# Patient Record
Sex: Male | Born: 1935 | Hispanic: Yes | Smoking: Never smoker
Health system: Southern US, Community
[De-identification: ages and names within clinical notes are randomized; demographics above are authoritative.]

## PROBLEM LIST (undated history)

## (undated) DIAGNOSIS — G479 Sleep disorder, unspecified: Secondary | ICD-10-CM

## (undated) DIAGNOSIS — R3911 Hesitancy of micturition: Secondary | ICD-10-CM

## (undated) DIAGNOSIS — I10 Essential (primary) hypertension: Secondary | ICD-10-CM

## (undated) DIAGNOSIS — R3915 Urgency of urination: Principal | ICD-10-CM

## (undated) DIAGNOSIS — Z0000 Encounter for general adult medical examination without abnormal findings: Secondary | ICD-10-CM

## (undated) DIAGNOSIS — R972 Elevated prostate specific antigen [PSA]: Secondary | ICD-10-CM

## (undated) DIAGNOSIS — E785 Hyperlipidemia, unspecified: Secondary | ICD-10-CM

## (undated) DIAGNOSIS — D696 Thrombocytopenia, unspecified: Secondary | ICD-10-CM

## (undated) DIAGNOSIS — R21 Rash and other nonspecific skin eruption: Secondary | ICD-10-CM

## (undated) DIAGNOSIS — L304 Erythema intertrigo: Secondary | ICD-10-CM

## (undated) DIAGNOSIS — F411 Generalized anxiety disorder: Secondary | ICD-10-CM

## (undated) DIAGNOSIS — L309 Dermatitis, unspecified: Secondary | ICD-10-CM

## (undated) DIAGNOSIS — K5900 Constipation, unspecified: Secondary | ICD-10-CM

## (undated) HISTORY — DX: Elevated prostate specific antigen (PSA): R97.20

## (undated) HISTORY — DX: Dermatitis, unspecified: L30.9

## (undated) HISTORY — DX: Erythema intertrigo: L30.4

## (undated) HISTORY — DX: Essential (primary) hypertension: I10

## (undated) HISTORY — DX: Sleep disorder, unspecified: G47.9

## (undated) HISTORY — DX: Hyperlipidemia, unspecified: E78.5

## (undated) HISTORY — DX: Urgency of urination: R39.15

## (undated) HISTORY — DX: Encounter for general adult medical examination without abnormal findings: Z00.00

## (undated) HISTORY — DX: Thrombocytopenia, unspecified: D69.6

## (undated) HISTORY — DX: Generalized anxiety disorder: F41.1

## (undated) HISTORY — DX: Rash and other nonspecific skin eruption: R21

## (undated) HISTORY — DX: Hesitancy of micturition: R39.11

## (undated) HISTORY — DX: Constipation, unspecified: K59.00

---

## 2011-09-10 ENCOUNTER — Encounter: Payer: Self-pay | Admitting: Family Medicine

## 2011-09-10 ENCOUNTER — Ambulatory Visit (INDEPENDENT_AMBULATORY_CARE_PROVIDER_SITE_OTHER): Payer: Medicare Other | Admitting: Family Medicine

## 2011-09-10 DIAGNOSIS — Z0000 Encounter for general adult medical examination without abnormal findings: Secondary | ICD-10-CM

## 2011-09-10 DIAGNOSIS — G4700 Insomnia, unspecified: Secondary | ICD-10-CM

## 2011-09-10 DIAGNOSIS — I10 Essential (primary) hypertension: Secondary | ICD-10-CM

## 2011-09-10 DIAGNOSIS — Z23 Encounter for immunization: Secondary | ICD-10-CM | POA: Diagnosis not present

## 2011-09-10 MED ORDER — NEBIVOLOL HCL 5 MG PO TABS: 5.0000 mg | ORAL_TABLET | Freq: Every day | ORAL | Status: DC

## 2011-09-10 NOTE — Patient Instructions (Addendum)
Preventative Care for Adults, Male A healthy lifestyle and preventative care can promote health and wellness. Preventative health guidelines for men include the following key practices:  A routine yearly physical is a good way to check with your caregiver about your health and preventative screening. It is a chance to share any concerns and updates on your health, and to receive a thorough exam.   Visit your dentist for a routine exam and preventative care every 6 months. Brush your teeth twice a day and floss once a day. Good oral hygiene prevents tooth decay and gum disease.   The frequency of eye exams is based on your age, health, family medical history, use of contact lenses, and other factors. Follow your caregiver's recommendations for frequency of eye exams.   Eat a healthy diet. Foods like vegetables, fruits, whole grains, low-fat dairy products, and lean protein foods contain the nutrients you need without too many calories. Decrease your intake of foods high in solid fats, added sugars, and salt. Eat the right amount of calories for you.Get information about a proper diet from your caregiver, if necessary.   Regular physical exercise is one of the most important things you can do for your health. Most adults should get at least 150 minutes of moderate-intensity exercise (any activity that increases your heart rate and causes you to sweat) each week. In addition, most adults need muscle-strengthening exercises on 2 or more days a week.   Maintain a healthy weight. The body mass index (BMI) is a screening tool to identify possible weight problems. It provides an estimate of body fat based on height and weight. Your caregiver can help determine your BMI, and can help you achieve or maintain a healthy weight.For adults 20 years and older:   A BMI below 18.5 is considered underweight.   A BMI of 18.5 to 24.9 is normal.   A BMI of 25 to 29.9 is considered overweight.   A BMI of 30 and  above is considered obese.   Maintain normal blood lipids and cholesterol levels by exercising and minimizing your intake of saturated fat. Eat a balanced diet with plenty of fruit and vegetables. Blood tests for lipids and cholesterol should begin at age 20 and be repeated every 5 years. If your lipid or cholesterol levels are high, you are over 50, or you are a high risk for heart disease, you may need your cholesterol levels checked more frequently.Ongoing high lipid and cholesterol levels should be treated with medicines if diet and exercise are not effective.   If you smoke, find out from your caregiver how to quit. If you do not use tobacco, do not start.   If you choose to drink alcohol, do not exceed 2 drinks per day. One drink is considered to be 12 ounces (355 mL) of beer, 5 ounces (148 mL) of wine, or 1.5 ounces (44 mL) of liquor.   Avoid use of street drugs. Do not share needles with anyone. Ask for help if you need support or instructions about stopping the use of drugs.   High blood pressure causes heart disease and increases the risk of stroke. Your blood pressure should be checked at least every 1 to 2 years. Ongoing high blood pressure should be treated with medicines, if weight loss and exercise are not effective.   If you are 45 to 76 years old, ask your caregiver if you should take aspirin to prevent heart disease.   Diabetes screening involves taking a blood   sample to check your fasting blood sugar level. This should be done once every 3 years, after age 45, if you are within normal weight and without risk factors for diabetes. Testing should be considered at a younger age or be carried out more frequently if you are overweight and have at least 1 risk factor for diabetes.   Colorectal cancer can be detected and often prevented. Most routine colorectal cancer screening begins at the age of 50 and continues through age 75. However, your caregiver may recommend screening at an  earlier age if you have risk factors for colon cancer. On a yearly basis, your caregiver may provide home test kits to check for hidden blood in the stool. Use of a small camera at the end of a tube, to directly examine the colon (sigmoidoscopy or colonoscopy), can detect the earliest forms of colorectal cancer. Talk to your caregiver about this at age 50, when routine screening begins. Direct examination of the colon should be repeated every 5 to 10 years through age 75, unless early forms of pre-cancerous polyps or small growths are found.   Practice safe sex. Use condoms and avoid high-risk sexual practices to reduce the spread of sexually transmitted infections (STIs). STIs include gonorrhea, chlamydia, syphilis, trichomonas, herpes, HPV, and human immunodeficiency virus (HIV). Herpes, HIV, and HPV are viral illnesses that have no cure. They can result in disability, cancer, and death.   A one-time screening for abdominal aortic aneurysm (AAA) and surgical repair of large AAAs by sound wave imaging (ultrasonography) is recommended for ages 65 to 75 years who are current or former smokers.   Healthy men should no longer receive prostate-specific antigen (PSA) blood tests as part of routine cancer screening. Consult with your caregiver about prostate cancer screening.   Use sunscreen with skin protection factor (SPF) of 30 or more. Apply sunscreen liberally and repeatedly throughout the day. You should seek shade when your shadow is shorter than you. Protect yourself by wearing long sleeves, pants, a wide-brimmed hat, and sunglasses year round, whenever you are outdoors.   Once a month, do a whole body skin exam, using a mirror to look at the skin on your back. Notify your caregiver of new moles, moles that have irregular borders, moles that are larger than a pencil eraser, or moles that have changed in shape or color.   Stay current with required immunizations.   Influenza. You need a dose every  fall (or winter). The composition of the flu vaccine changes each year, so being vaccinated once is not enough.   Pneumococcal polysaccharide. You need 1 to 2 doses if you smoke cigarettes or if you have certain chronic medical conditions. You need 1 dose at age 65 (or older) if you have never been vaccinated.   Tetanus, diphtheria, pertussis (Tdap, Td). Get 1 dose of Tdap vaccine if you are younger than age 65 years, are over 65 and have contact with an infant, are a healthcare worker, or simply want to be protected from whooping cough. After that, you need a Td booster dose every 10 years. Consult your caregiver if you have not had at least 3 tetanus and diphtheria-containing shots sometime in your life or have a deep or dirty wound.   HPV. This vaccine is recommended for males 13 through 76 years of age. This vaccine may be given to men 22 through 76 years of age who have not completed the 3 dose series. It is recommended for men through age 26   who have sex with men or whose immune system is weakened because of HIV infection, other illness, or medications. The vaccine is given in 3 doses over 6 months.   Measles, mumps, rubella (MMR). You need at least 1 dose of MMR if you were born in 1957 or later. You may also need a 2nd dose.   Meningococcal. If you are age 19 to 21 years and a first-year college student living in a residence hall, or have one of several medical conditions, you need to get vaccinated against meningococcal disease. You may also need additional booster doses.   Zoster (shingles). If you are age 60 years or older, you should get this vaccine.   Varicella (chickenpox). If you have never had chickenpox or you were vaccinated but received only 1 dose, talk to your caregiver to find out if you need this vaccine.   Hepatitis A. You need this vaccine if you have a specific risk factor for hepatitis A virus infection, or you simply wish to be protected from this disease. The vaccine is  usually given as 2 doses, 6 to 18 months apart.   Hepatitis B. You need this vaccine if you have a specific risk factor for hepatitis B virus infection or you simply wish to be protected from this disease. The vaccine is given in 3 doses, usually over 6 months.  Preventative Service / Frequency Ages 19 to 39  Blood pressure check.** / Every 1 to 2 years.   Lipid and cholesterol check.**/ Every 5 years beginning at age 20.   Skin self-exam. / Monthly.   Influenza immunization.** / Every year.   Pneumococcal polysaccharide immunization.** / 1 to 2 doses if you smoke cigarettes or if you have certain chronic medical conditions.   Tetanus, diphtheria, pertussis (Tdap,Td) immunization. / A one-time dose of Tdap vaccine. After that, you need a Td booster dose every 10 years.   HPV immunization. / 3 doses over 6 months, if 26 and younger.   Measles, mumps, rubella (MMR) immunization. / You need at least 1 dose of MMR if you were born in 1957 or later. You may also need a 2nd dose.   Meningococcal immunization. / 1 dose if you are age 19 to 21 years and a first-year college student living in a residence hall, or have one of several medical conditions, you need to get vaccinated against meningococcal disease. You may also need additional booster doses.   Varicella immunization. **/ Consult your caregiver.   Hepatitis A immunization. ** / Consult your caregiver. 2 doses, 6 to 18 months apart.   Hepatitis B immunization.** / Consult your caregiver. 3 doses usually over 6 months.  Ages 40 to 64  Blood pressure check.** / Every 1 to 2 years.   Lipid and cholesterol check.**/ Every 5 years beginning at age 20.   Fecal occult blood test (FOBT) of stool. / Every year beginning at age 50 and continuing until age 75. You may not have to do this test if you get colonoscopy every 10 years.   Flexible sigmoidoscopy** or colonoscopy.** / Every 5 years for a flexible sigmoidoscopy or every 10 years for  a colonoscopy beginning at age 50 and continuing until age 75.   Skin self-exam. / Monthly.   Influenza immunization.** / Every year.   Pneumococcal polysaccharide immunization.** / 1 to 2 doses if you smoke cigarettes or if you have certain chronic medical conditions.   Tetanus, diphtheria, pertussis (Tdap/Td) immunization.** / A one-time dose of   Tdap vaccine. After that, you need a Td booster dose every 10 years.   Measles, mumps, rubella (MMR) immunization. / You need at least 1 dose of MMR if you were born in 1957 or later. You may also need a 2nd dose.   Varicella immunization. **/ Consult your caregiver.   Meningococcal immunization.** / Consult your caregiver.   Hepatitis A immunization. ** / Consult your caregiver. 2 doses, 6 to 18 months apart.   Hepatitis B immunization.** / Consult your caregiver. 3 doses, usually over 6 months.  Ages 84 and over  Blood pressure check.** / Every 1 to 2 years.   Lipid and cholesterol check.**/ Every 5 years beginning at age 51.   Fecal occult blood test (FOBT) of stool. / Every year beginning at age 34 and continuing until age 63. You may not have to do this test if you get colonoscopy every 10 years.   Flexible sigmoidoscopy** or colonoscopy.** / Every 5 years for a flexible sigmoidoscopy or every 10 years for a colonoscopy beginning at age 97 and continuing until age 53.   Abdominal aortic aneurysm (AAA) screening.** / A one-time screening for ages 26 to 40 years who are current or former smokers.   Skin self-exam. / Monthly.   Influenza immunization.** / Every year.   Pneumococcal polysaccharide immunization.** / 1 dose at age 83 (or older) if you have never been vaccinated.   Tetanus, diphtheria, pertussis (Tdap, Td) immunization. / A one-time dose of Tdap vaccine if you are over 65 and have contact with an infant, are a Research scientist (physical sciences), or simply want to be protected from whooping cough. After that, you need a Td booster dose  every 10 years.   Varicella immunization. **/ Consult your caregiver.   Meningococcal immunization.** / Consult your caregiver.   Hepatitis A immunization. ** / Consult your caregiver. 2 doses, 6 to 18 months apart.   Hepatitis B immunization.** / Check with your caregiver. 3 doses, usually over 6 months.  **Family history and personal history of risk and conditions may change your caregiver's recommendations. Document Released: 09/28/2001 Document Revised: 04/14/2011 Document Reviewed: 12/28/2010 [facility] Patient Information 2012 [place], [place].  Hypertension As your heart beats, it forces blood through your arteries. This force is your blood pressure. If the pressure is too high, it is called hypertension (HTN) or high blood pressure. HTN is dangerous because you may have it and not know it. High blood pressure may mean that your heart has to work harder to pump blood. Your arteries may be narrow or stiff. The extra work puts you at risk for heart disease, stroke, and other problems.  Blood pressure consists of two numbers, a higher number over a lower, 110/72, for example. It is stated as "110 over 72." The ideal is below 120 for the top number (systolic) and under 80 for the bottom (diastolic). Write down your blood pressure today. You should pay close attention to your blood pressure if you have certain conditions such as:  Heart failure.   Prior heart attack.   Diabetes   Chronic kidney disease.   Prior stroke.   Multiple risk factors for heart disease.  To see if you have HTN, your blood pressure should be measured while you are seated with your arm held at the level of the heart. It should be measured at least twice. A one-time elevated blood pressure reading (especially in the Emergency Department) does not mean that you need treatment. There may be conditions in which  the blood pressure is different between your right and left arms. It is important to see your caregiver soon  for a recheck. Most people have essential hypertension which means that there is not a specific cause. This type of high blood pressure may be lowered by changing lifestyle factors such as:  Stress.   Smoking.   Lack of exercise.   Excessive weight.   Drug/tobacco/alcohol use.   Eating less salt.  Most people do not have symptoms from high blood pressure until it has caused damage to the body. Effective treatment can often prevent, delay or reduce that damage. TREATMENT  When a cause has been identified, treatment for high blood pressure is directed at the cause. There are a large number of medications to treat HTN. These fall into several categories, and your caregiver will help you select the medicines that are best for you. Medications may have side effects. You should review side effects with your caregiver. If your blood pressure stays high after you have made lifestyle changes or started on medicines,   Your medication(s) may need to be changed.   Other problems may need to be addressed.   Be certain you understand your prescriptions, and know how and when to take your medicine.   Be sure to follow up with your caregiver within the time frame advised (usually within two weeks) to have your blood pressure rechecked and to review your medications.   If you are taking more than one medicine to lower your blood pressure, make sure you know how and at what times they should be taken. Taking two medicines at the same time can result in blood pressure that is too low.  SEEK IMMEDIATE MEDICAL CARE IF:  You develop a severe headache, blurred or changing vision, or confusion.   You have unusual weakness or numbness, or a faint feeling.   You have severe chest or abdominal pain, vomiting, or breathing problems.  MAKE SURE YOU:   Understand these instructions.   Will watch your condition.   Will get help right away if you are not doing well or get worse.  Document Released:  08/02/2005 Document Revised: 04/14/2011 Document Reviewed: 03/22/2008 [facility] Patient Information 2012 [place], [place].

## 2011-09-12 ENCOUNTER — Encounter: Payer: Self-pay | Admitting: Family Medicine

## 2011-09-12 DIAGNOSIS — G4700 Insomnia, unspecified: Secondary | ICD-10-CM | POA: Insufficient documentation

## 2011-09-12 DIAGNOSIS — I10 Essential (primary) hypertension: Secondary | ICD-10-CM

## 2011-09-12 DIAGNOSIS — Z0000 Encounter for general adult medical examination without abnormal findings: Secondary | ICD-10-CM | POA: Insufficient documentation

## 2011-09-12 HISTORY — DX: Encounter for general adult medical examination without abnormal findings: Z00.00

## 2011-09-12 HISTORY — DX: Essential (primary) hypertension: I10

## 2011-09-12 NOTE — Assessment & Plan Note (Signed)
Patient reports a long history of white coat hypertension. He denies ever having previously been treated. He also denies checking anywhere else. No concerning symptoms but due to the significant elevation we'll start the spelled 5 mg daily patient is given samples reassess in 3 days' time seek immediate care symptoms develop. Given him or contact diet and asked to avoid sodium and stimulants.

## 2011-09-12 NOTE — Assessment & Plan Note (Signed)
Has never had colonoscopy and does not routinely do immunizations, he is asked to consider these at future visit, patient agrees to return for fasting lab work

## 2011-09-12 NOTE — Progress Notes (Signed)
Patient ID: Steven Morrison, male   DOB: Dec 25, 1927, 78 y.o.   MRN: [id number] Steven Morrison [id number] 1927/11/15 09/12/2011      Progress Note New Patient  Subjective  Chief Complaint  Chief Complaint  Patient presents with  . Establish Care    new patient    HPI   patient is a 78 year old [place] American man who was originally born [place], immigrated initially to [place] and then eventually settled in [place]. He has recently relocated to [place] to be nearer to his only grandchild. He reports being in good health and he offers no acute complaints. He notes on questioning that he suffers with Casimiro Needle tender nose has high blood pressure the doctors but denies ever being treated. He maintains a nearly vegan diet. His biggest complaint is difficulty sleeping. He sleeps anywhere from 4-6 hours a night has trouble focusing and staying asleep and has tried over-the-counter medications including Unisom without relief. Palate, headache, shortness of breath, GI or GU complaints.  Past Medical History  Diagnosis Date  . HTN (hypertension) 09/12/2011    History reviewed. No pertinent past surgical history.  Family History  Problem Relation Age of Onset  . Migraines Daughter   . Allergies Daughter   . Allergies Son     History   Social History  . Marital Status: Divorced    Spouse Name: N/A    Number of Children: N/A  . Years of Education: N/A   Occupational History  . Not on file.   Social History Main Topics  . Smoking status: Never Smoker   . Smokeless tobacco: Never Used  . Alcohol Use: Yes     occasionally  . Drug Use: No  . Sexually Active: No   Other Topics Concern  . Not on file   Social History Narrative  . No narrative on file    No current outpatient prescriptions on file prior to visit.    No Known Allergies  Review of Systems  Review of Systems  Constitutional: Negative for fever and malaise/fatigue.  HENT: Negative for  congestion.   Eyes: Negative for discharge.  Respiratory: Negative for shortness of breath.   Cardiovascular: Negative for chest pain, palpitations and leg swelling.  Gastrointestinal: Negative for nausea, abdominal pain and diarrhea.  Genitourinary: Negative for dysuria.  Musculoskeletal: Negative for falls.  Skin: Negative for rash.  Neurological: Negative for loss of consciousness and headaches.  Endo/Heme/Allergies: Negative for polydipsia.  Psychiatric/Behavioral: Negative for depression and suicidal ideas. The patient has insomnia. The patient is not nervous/anxious.        Has trouble both falling and staying asleep. Has tried Unisom without benefit    Objective  BP 204/98  Pulse 84  Temp(Src) 98.4 F (36.9 C) (Temporal)  Ht 5' 0.25" (1.53 m)  Wt 139 lb 1.9 oz (63.104 kg)  BMI 26.94 kg/m2  SpO2 99%  Physical Exam  Physical Exam  Constitutional: He is oriented to person, place, and time and well-developed, well-nourished, and in no distress. No distress.  HENT:  Head: Normocephalic and atraumatic.  Eyes: Conjunctivae are normal.  Neck: Neck supple. No thyromegaly present.  Cardiovascular: Normal rate, regular rhythm and normal heart sounds.   No murmur heard. Pulmonary/Chest: Effort normal and breath sounds normal. No respiratory distress.  Abdominal: He exhibits no distension and no mass. There is no tenderness.  Musculoskeletal: He exhibits no edema.  Neurological: He is alert and oriented to person, place, and time.  Skin: Skin is warm.  Psychiatric: Memory, affect and judgment normal.       Assessment & Plan  HTN (hypertension) Patient reports a long history of white coat hypertension. He denies ever having previously been treated. He also denies checking anywhere else. No concerning symptoms but due to the significant elevation we'll start the spelled 5 mg daily patient is given samples reassess in 3 days' time seek immediate care symptoms develop. Given  him or contact diet and asked to avoid sodium and stimulants.  Preventative health care Has never had colonoscopy and does not routinely do immunizations, he is asked to consider these at future visit, patient agrees to return for fasting lab work  Insomnia Avoid stimulants, check labs, try Benadryl and/or Melatonin

## 2011-09-12 NOTE — Assessment & Plan Note (Signed)
Avoid stimulants, check labs, try Benadryl and/or Melatonin

## 2011-09-13 ENCOUNTER — Ambulatory Visit (INDEPENDENT_AMBULATORY_CARE_PROVIDER_SITE_OTHER): Payer: Medicare Other | Admitting: Family Medicine

## 2011-09-13 ENCOUNTER — Encounter: Payer: Self-pay | Admitting: Family Medicine

## 2011-09-13 VITALS — BP 184/78 | HR 60 | Temp 98.1°F | Ht 60.25 in | Wt 143.0 lb

## 2011-09-13 DIAGNOSIS — I10 Essential (primary) hypertension: Secondary | ICD-10-CM | POA: Diagnosis not present

## 2011-09-13 MED ORDER — NEBIVOLOL HCL 10 MG PO TABS: 10.0000 mg | ORAL_TABLET | Freq: Every day | ORAL | Status: DC

## 2011-09-13 NOTE — Patient Instructions (Addendum)
Hypertension As your heart beats, it forces blood through your arteries. This force is your blood pressure. If the pressure is too high, it is called hypertension (HTN) or high blood pressure. HTN is dangerous because you may have it and not know it. High blood pressure may mean that your heart has to work harder to pump blood. Your arteries may be narrow or stiff. The extra work puts you at risk for heart disease, stroke, and other problems.  Blood pressure consists of two numbers, a higher number over a lower, 110/72, for example. It is stated as "110 over 72." The ideal is below 120 for the top number (systolic) and under 80 for the bottom (diastolic). Write down your blood pressure today. You should pay close attention to your blood pressure if you have certain conditions such as:  Heart failure.   Prior heart attack.   Diabetes   Chronic kidney disease.   Prior stroke.   Multiple risk factors for heart disease.  To see if you have HTN, your blood pressure should be measured while you are seated with your arm held at the level of the heart. It should be measured at least twice. A one-time elevated blood pressure reading (especially in the Emergency Department) does not mean that you need treatment. There may be conditions in which the blood pressure is different between your right and left arms. It is important to see your caregiver soon for a recheck. Most people have essential hypertension which means that there is not a specific cause. This type of high blood pressure may be lowered by changing lifestyle factors such as:  Stress.   Smoking.   Lack of exercise.   Excessive weight.   Drug/tobacco/alcohol use.   Eating less salt.  Most people do not have symptoms from high blood pressure until it has caused damage to the body. Effective treatment can often prevent, delay or reduce that damage. TREATMENT  When a cause has been identified, treatment for high blood pressure is  directed at the cause. There are a large number of medications to treat HTN. These fall into several categories, and your caregiver will help you select the medicines that are best for you. Medications may have side effects. You should review side effects with your caregiver. If your blood pressure stays high after you have made lifestyle changes or started on medicines,   Your medication(s) may need to be changed.   Other problems may need to be addressed.   Be certain you understand your prescriptions, and know how and when to take your medicine.   Be sure to follow up with your caregiver within the time frame advised (usually within two weeks) to have your blood pressure rechecked and to review your medications.   If you are taking more than one medicine to lower your blood pressure, make sure you know how and at what times they should be taken. Taking two medicines at the same time can result in blood pressure that is too low.  SEEK IMMEDIATE MEDICAL CARE IF:  You develop a severe headache, blurred or changing vision, or confusion.   You have unusual weakness or numbness, or a faint feeling.   You have severe chest or abdominal pain, vomiting, or breathing problems.  MAKE SURE YOU:   Understand these instructions.   Will watch your condition.   Will get help right away if you are not doing well or get worse.  Document Released: 08/02/2005 Document Revised: 04/14/2011 Document Reviewed:   03/22/2008 Exit[employer] Patient Information 2012 [place], [place]. Increase the Bystolic to 10mg  daily and check BP daily, if bp remains below 200 on top and 100 on the bottom then just follow it for the next week, then next week if systolic stays above 160 then call for a new prescription such as Chlorthalidone  Minimize sodium and no strenuous exercise til seen again

## 2011-09-13 NOTE — Assessment & Plan Note (Signed)
No concerning SE with the Bystolic, blood pressure still not at goal. We'll increase his dose to 10 mg daily. He is leaving in less than 2 days time for a trip back to [place] to visit family. We'll monitor his blood pressure closely there and he is given instructions to call so that we may call in some chlorthalidone of his blood pressure continues to run higher than 160/100. Reassess in 2 weeks' time when he returns or as needed.

## 2011-09-13 NOTE — Progress Notes (Signed)
Patient ID: Steven Morrison, male   DOB: 17-May-1928, 78 y.o.   MRN: [id number] Steven Morrison [id number] Aug 12, 1928 09/13/2011      Progress Note-Follow Up  Subjective  Chief Complaint  Chief Complaint  Patient presents with  . Follow-up    3 day follow up    HPI  This 78 year old African American male seen in rapid followup for significantly elevated blood pressure. He has never been symptomatic and continues to be asymptomatic. He's not had any difficulty with a Bystolic 5 mg daily, he has taken it since he was seen on Friday. He denies fatigue, constipation, chest pain, palpitations, shortness of breath, headache, GI or GU complaints at today's visit.  Past Medical History  Diagnosis Date  . HTN (hypertension) 09/12/2011    History reviewed. No pertinent past surgical history.  Family History  Problem Relation Age of Onset  . Migraines Daughter   . Allergies Daughter   . Allergies Son     History   Social History  . Marital Status: Divorced    Spouse Name: N/A    Number of Children: N/A  . Years of Education: N/A   Occupational History  . Not on file.   Social History Main Topics  . Smoking status: Never Smoker   . Smokeless tobacco: Never Used  . Alcohol Use: Yes     occasionally  . Drug Use: No  . Sexually Active: No   Other Topics Concern  . Not on file   Social History Narrative  . No narrative on file    Current Outpatient Prescriptions on File Prior to Visit  Medication Sig Dispense Refill  . Coenzyme Q10 (CO Q 10) 100 MG CAPS Take 1 capsule by mouth daily.      . vitamin C (ASCORBIC ACID) 500 MG tablet Take 1,000 mg by mouth daily.        No Known Allergies  Review of Systems  Review of Systems  Constitutional: Negative for fever and malaise/fatigue.  HENT: Negative for congestion.   Eyes: Negative for discharge.  Respiratory: Negative for shortness of breath.   Cardiovascular: Negative for chest pain, palpitations and leg  swelling.  Gastrointestinal: Negative for nausea, abdominal pain and diarrhea.  Genitourinary: Negative for dysuria.  Musculoskeletal: Negative for falls.  Skin: Negative for rash.  Neurological: Negative for loss of consciousness and headaches.  Endo/Heme/Allergies: Negative for polydipsia.  Psychiatric/Behavioral: Negative for depression and suicidal ideas. The patient is not nervous/anxious and does not have insomnia.     Objective  BP 184/78  Pulse 60  Temp(Src) 98.1 F (36.7 C) (Temporal)  Ht 5' 0.25" (1.53 m)  Wt 143 lb (64.864 kg)  BMI 27.70 kg/m2  SpO2 98%  Physical Exam  Physical Exam  Constitutional: He is oriented to person, place, and time and well-developed, well-nourished, and in no distress. No distress.  HENT:  Head: Normocephalic and atraumatic.  Eyes: Conjunctivae are normal.  Neck: Neck supple. No thyromegaly present.  Cardiovascular: Normal rate, regular rhythm and normal heart sounds.   No murmur heard. Pulmonary/Chest: Effort normal and breath sounds normal. No respiratory distress.  Abdominal: He exhibits no distension and no mass. There is no tenderness.  Musculoskeletal: He exhibits no edema.  Neurological: He is alert and oriented to person, place, and time.  Skin: Skin is warm.  Psychiatric: Memory, affect and judgment normal.       Assessment & Plan  HTN (hypertension) No concerning SE with the Bystolic, blood pressure still not  at goal. We'll increase his dose to 10 mg daily. He is leaving in less than 2 days time for a trip back to [place] to visit family. We'll monitor his blood pressure closely there and he is given instructions to call so that we may call in some chlorthalidone of his blood pressure continues to run higher than 160/100. Reassess in 2 weeks' time when he returns or as needed.

## 2011-10-05 ENCOUNTER — Ambulatory Visit: Payer: Medicare Other | Admitting: Family Medicine

## 2011-10-12 ENCOUNTER — Ambulatory Visit (INDEPENDENT_AMBULATORY_CARE_PROVIDER_SITE_OTHER): Payer: Medicare Other | Admitting: Family Medicine

## 2011-10-12 ENCOUNTER — Encounter: Payer: Self-pay | Admitting: Family Medicine

## 2011-10-12 DIAGNOSIS — R3915 Urgency of urination: Secondary | ICD-10-CM | POA: Diagnosis not present

## 2011-10-12 DIAGNOSIS — R972 Elevated prostate specific antigen [PSA]: Secondary | ICD-10-CM | POA: Insufficient documentation

## 2011-10-12 DIAGNOSIS — Z0000 Encounter for general adult medical examination without abnormal findings: Secondary | ICD-10-CM

## 2011-10-12 DIAGNOSIS — I10 Essential (primary) hypertension: Secondary | ICD-10-CM

## 2011-10-12 DIAGNOSIS — R3911 Hesitancy of micturition: Secondary | ICD-10-CM

## 2011-10-12 HISTORY — DX: Urgency of urination: R39.15

## 2011-10-12 HISTORY — DX: Hesitancy of micturition: R39.11

## 2011-10-12 LAB — PSA: PSA: 5.7 ng/mL — ABNORMAL HIGH (ref 0.10–4.00)

## 2011-10-12 LAB — RENAL FUNCTION PANEL
CO2: 28 mEq/L (ref 19–32)
Creatinine, Ser: 0.9 mg/dL (ref 0.4–1.5)
GFR: 84.86 mL/min (ref >60.00)
Glucose, Bld: 115 mg/dL — ABNORMAL HIGH (ref 70–99)
Sodium: 138 mEq/L (ref 135–145)

## 2011-10-12 LAB — POCT URINALYSIS DIPSTICK
Blood, UA: NEGATIVE
Glucose, UA: NEGATIVE
Leukocytes, UA: NEGATIVE
Nitrite, UA: NEGATIVE
Urobilinogen, UA: 0.2
pH, UA: 8.5

## 2011-10-12 LAB — CBC
HCT: 46.8 % (ref 39.0–52.0)
MCV: 84.4 fl (ref 78.0–100.0)
RDW: 15.1 % — ABNORMAL HIGH (ref 11.5–14.6)
WBC: 5.6 10*3/uL (ref 4.5–10.5)

## 2011-10-12 LAB — HEPATIC FUNCTION PANEL: Total Bilirubin: 0.5 mg/dL (ref 0.3–1.2)

## 2011-10-12 LAB — LIPID PANEL: Total CHOL/HDL Ratio: 4

## 2011-10-12 MED ORDER — TAMSULOSIN HCL 0.4 MG PO CAPS: 0.4000 mg | ORAL_CAPSULE | Freq: Every day | ORAL | Status: DC

## 2011-10-12 NOTE — Assessment & Plan Note (Signed)
Unfortunately he stopped his Bystolic several weeks ago not because he was having any trouble with it but because he was worried about the long term side effects of taking it. He is reassured that his risk from the medicine is low and his risk of a bad event such as stroke, CHF and MI are very high with these numbers. He is asymptomatic and assures [place] he checks it at home and usually sees 180s and 190s over 90s. He dis have one number of 143/64 after a hot bath last week. He is given a tablet of Bystolic 10 mg daily to take in the office and agrees to take it daily and let us recheck his BP next week. Minimize sodium

## 2011-10-12 NOTE — Assessment & Plan Note (Addendum)
Agrees to fasting labs today

## 2011-10-12 NOTE — Progress Notes (Signed)
Patient ID: Steven Morrison, male   DOB: 05/14/1928, 78 y.o.   MRN: [id number] Steven Morrison [id number] August 23, 1927 10/12/2011      Progress Note-Follow Up  Subjective  Chief Complaint  Chief Complaint  Patient presents with  . Follow-up    3 week follow up    HPI  Patient is an 78 year old Afro-American male who is in today for followup. At his last visit his blood pressure had been somewhat improved on Bystolic 5 mg. Increased to 10 mg a day for several days and then decided to stop it. He denies any untoward side effects but was worried about long-term side effects if he continued to take it. He denies headache, chest pain, palpitations, shortness of breath, recent illness, GI complaints. He has just read an article on BPH and realizes he is showing symptoms of this. He is struggling with some sense of incomplete eating also notes some urgency and some frequency. Gets up roughly 3 times a night. Does note a decrease in his stream  A wlldeiesayncinene, anuria, dysuria, hematuria.  Past Medical History  Diagnosis Date  . HTN (hypertension) 09/12/2011  . Urinary urgency 10/12/2011    History reviewed. No pertinent past surgical history.  Family History  Problem Relation Age of Onset  . Migraines Daughter   . Allergies Daughter   . Allergies Son     History   Social History  . Marital Status: Divorced    Spouse Name: N/A    Number of Children: N/A  . Years of Education: N/A   Occupational History  . Not on file.   Social History Main Topics  . Smoking status: Never Smoker   . Smokeless tobacco: Never Used  . Alcohol Use: Yes     occasionally  . Drug Use: No  . Sexually Active: No   Other Topics Concern  . Not on file   Social History Narrative  . No narrative on file    Current Outpatient Prescriptions on File Prior to Visit  Medication Sig Dispense Refill  . Coenzyme Q10 (CO Q 10) 100 MG CAPS Take 1 capsule by mouth daily.      . nebivolol  (BYSTOLIC) 10 MG tablet Take 1 tablet (10 mg total) by mouth daily.  28 tablet    . vitamin C (ASCORBIC ACID) 500 MG tablet Take 1,000 mg by mouth daily.        No Known Allergies  Review of Systems  Review of Systems  Constitutional: Negative for fever and malaise/fatigue.  HENT: Negative for congestion.   Eyes: Negative for pain and discharge.  Respiratory: Negative for shortness of breath.   Cardiovascular: Negative for chest pain, palpitations and leg swelling.  Gastrointestinal: Negative for nausea, abdominal pain and diarrhea.  Genitourinary: Positive for urgency and frequency. Negative for dysuria, hematuria and flank pain.  Musculoskeletal: Negative for falls.  Skin: Negative for rash.  Neurological: Negative for loss of consciousness and headaches.  Endo/Heme/Allergies: Negative for polydipsia.  Psychiatric/Behavioral: Negative for depression and suicidal ideas. The patient is not nervous/anxious and does not have insomnia.     Objective  BP 220/105  Pulse 84  Temp(Src) 97.6 F (36.4 C) (Temporal)  Ht 5' 0.25" (1.53 m)  Wt 141 lb 6.4 oz (64.139 kg)  BMI 27.39 kg/m2  SpO2 95%  Physical Exam  Physical Exam  Constitutional: He is oriented to person, place, and time and well-developed, well-nourished, and in no distress. No distress.  HENT:  Head: Normocephalic and atraumatic.  Eyes: Conjunctivae are normal.  Neck: Neck supple. No thyromegaly present.  Cardiovascular: Normal rate, regular rhythm and normal heart sounds.   No murmur heard. Pulmonary/Chest: Effort normal and breath sounds normal. No respiratory distress.  Abdominal: He exhibits no distension and no mass. There is no tenderness.  Musculoskeletal: He exhibits no edema.  Neurological: He is alert and oriented to person, place, and time.  Skin: Skin is warm.  Psychiatric: Memory, affect and judgment normal.       Assessment & Plan  Urinary urgency Also notes sense of incomplete voiding,  dribbling, decreased stream and nocturia over several months. He is sent for PSA and urinalysis and referred to urology for further evaluation  HTN (hypertension) Unfortunately he stopped his Bystolic several weeks ago not because he was having any trouble with it but because he was worried about the long term side effects of taking it. He is reassured that his risk from the medicine is low and his risk of a bad event such as stroke, CHF and MI are very high with these numbers. He is asymptomatic and assures [place] he checks it at home and usually sees 180s and 190s over 90s. He dis have one number of 143/64 after a hot bath last week. He is given a tablet of Bystolic 10 mg daily to take in the office and agrees to take it daily and let us recheck his BP next week. Minimize sodium  Preventative health care Agrees to fasting labs today

## 2011-10-12 NOTE — Patient Instructions (Signed)
Benign Prostatic Hyperplasia You have an enlarged prostate. This is common in elderly males. It is called BPH. This stands for benign prostate hyperplasia. The prostate gland is located in base of the bladder. When it grows, the prostate blocks the urethra. This is the tube which drains urine from the bladder.  SYMPTOMS  Weak urine stream.   Dribbling.   Feeling like the bladder has not emptied completely.   Difficulty starting urination.   Getting up frequently at night to urinate.   Urinating more frequently during the day.  Complete urinary blockage or severe pain with urination requires immediate attention. DIAGNOSIS   Your caregiver often has a good idea what is wrong by taking a history and doing a physical exam.   Special x-rays may be done.  TREATMENT   For mild problems, no treatment may be necessary.   If the problems are moderate, medications may provide relief. Some of these work by making the prostate gland smaller. The herb saw palmetto is commonly used.   If complete blockage occurs, a Foley catheter is usually left in place for a few days.   Surgery is often needed for more severe problems. TURP is the prostate surgery for BPH which is done through the urethra. TURP stands for transurethral resection of the prostate. It involves cutting away chips from the prostate. It is done by removing chips so that they can come out through the penis.   Techniques using heat, microwave and laser to remove the prostate blockage are also being used.  HOME CARE INSTRUCTIONS   Give yourself time when you urinate.   Stay away from alcohol.   Beverages containing caffeine such as coffee, tea and colas can make the problems worse.   Decongestants, antihistamines, and some prescription medicines can also make the problem worse.   Follow up with your caregiver for further treatment as recommended.  SEEK IMMEDIATE MEDICAL CARE IF:   You develop increased pain with urination or  are unable to pass your water.   You develop severe abdominal pain, vomiting, a high fever, or fainting.   You develop back pain or blood in your urine.  MAKE SURE YOU:   Understand these instructions.   Will watch your condition.   Will get help right away if you are not doing well or get worse.  Document Released: 08/02/2005 Document Revised: 04/14/2011 Document Reviewed: 04/07/2007 [employer] Patient Information 2012 [employer] 

## 2011-10-12 NOTE — Assessment & Plan Note (Signed)
Also notes sense of incomplete voiding, dribbling, decreased stream and nocturia over several months. He is sent for PSA and urinalysis and referred to urology for further evaluation

## 2011-10-13 ENCOUNTER — Telehealth: Payer: Self-pay | Admitting: Family Medicine

## 2011-10-13 NOTE — Telephone Encounter (Signed)
Patient informed that we dont have any flomax samples

## 2011-10-13 NOTE — Telephone Encounter (Signed)
Pls contact patient, he wants to know if we have any flomax samples

## 2011-11-03 ENCOUNTER — Ambulatory Visit (INDEPENDENT_AMBULATORY_CARE_PROVIDER_SITE_OTHER): Payer: Medicare Other | Admitting: Family Medicine

## 2011-11-03 ENCOUNTER — Encounter: Payer: Self-pay | Admitting: Family Medicine

## 2011-11-03 VITALS — BP 194/92 | HR 52 | Temp 97.8°F | Ht 60.25 in | Wt 141.0 lb

## 2011-11-03 DIAGNOSIS — R3915 Urgency of urination: Secondary | ICD-10-CM

## 2011-11-03 DIAGNOSIS — I10 Essential (primary) hypertension: Secondary | ICD-10-CM | POA: Diagnosis not present

## 2011-11-03 DIAGNOSIS — R972 Elevated prostate specific antigen [PSA]: Secondary | ICD-10-CM

## 2011-11-03 DIAGNOSIS — R3911 Hesitancy of micturition: Secondary | ICD-10-CM

## 2011-11-03 HISTORY — DX: Elevated prostate specific antigen (PSA): R97.20

## 2011-11-03 MED ORDER — CHLORTHALIDONE 25 MG PO TABS: 25.0000 mg | ORAL_TABLET | Freq: Every day | ORAL | Status: DC

## 2011-11-03 NOTE — Assessment & Plan Note (Signed)
Improved some since starting Flomax

## 2011-11-03 NOTE — Progress Notes (Signed)
Patient ID: Steven Morrison, male   DOB: [birth year], 78 y.o.   MRN: [id number] Steven Morrison [id number] 05/16/1928 11/03/2011      Progress Note-Follow Up  Subjective  Chief Complaint  Chief Complaint  Patient presents with  . Follow-up    HTN    HPI  This is an 78 yo male in today for followup of his blood pressure. He reports he is taking his systolic now and denies any untoward side effects. Denies any fatigue, constipation, chest pain, palpitations, shortness of breath, GI complaints. Does believe the Flomax has helped his urinary urgency and frequency slightly. Has an appointment with urology tomorrow for his elevated PSA  Past Medical History  Diagnosis Date  . HTN (hypertension) 09/12/2011  . Urinary urgency 10/12/2011  . Elevated PSA 11/03/2011    No past surgical history on file.  Family History  Problem Relation Age of Onset  . Migraines Daughter   . Allergies Daughter   . Allergies Son     History   Social History  . Marital Status: Divorced    Spouse Name: N/A    Number of Children: N/A  . Years of Education: N/A   Occupational History  . Not on file.   Social History Main Topics  . Smoking status: Never Smoker   . Smokeless tobacco: Never Used  . Alcohol Use: Yes     occasionally  . Drug Use: No  . Sexually Active: No   Other Topics Concern  . Not on file   Social History Narrative  . No narrative on file    Current Outpatient Prescriptions on File Prior to Visit  Medication Sig Dispense Refill  . Coenzyme Q10 (CO Q 10) 100 MG CAPS Take 1 capsule by mouth daily.      . nebivolol (BYSTOLIC) 10 MG tablet Take 1 tablet (10 mg total) by mouth daily.  28 tablet    . Tamsulosin HCl (FLOMAX) 0.4 MG CAPS Take 1 capsule (0.4 mg total) by mouth daily.  30 capsule  3  . vitamin C (ASCORBIC ACID) 500 MG tablet Take 1,000 mg by mouth daily.        No Known Allergies  Review of Systems  Review of Systems  Constitutional: Negative for  fever and malaise/fatigue.  HENT: Negative for congestion.   Eyes: Negative for discharge.  Respiratory: Negative for shortness of breath.   Cardiovascular: Negative for chest pain, palpitations and leg swelling.  Gastrointestinal: Negative for nausea, abdominal pain and diarrhea.  Genitourinary: Positive for urgency and frequency. Negative for dysuria, hematuria and flank pain.  Musculoskeletal: Negative for falls.  Skin: Negative for rash.  Neurological: Negative for loss of consciousness and headaches.  Endo/Heme/Allergies: Negative for polydipsia.  Psychiatric/Behavioral: Negative for depression and suicidal ideas. The patient is not nervous/anxious and does not have insomnia.     Objective  BP 194/92  Pulse 52  Temp(Src) 97.8 F (36.6 C) (Temporal)  Ht 5' 0.25" (1.53 m)  Wt 141 lb (63.957 kg)  BMI 27.31 kg/m2  Physical Exam  Physical Exam  Constitutional: He is oriented to person, place, and time and well-developed, well-nourished, and in no distress. No distress.  HENT:  Head: Normocephalic and atraumatic.  Eyes: Conjunctivae are normal.  Neck: Neck supple. No thyromegaly present.  Cardiovascular: Normal rate, regular rhythm and normal heart sounds.   Pulmonary/Chest: Effort normal and breath sounds normal. No respiratory distress.  Abdominal: He exhibits no distension and no mass. There is no tenderness.  Musculoskeletal: He exhibits no edema.  Neurological: He is alert and oriented to person, place, and time.  Skin: Skin is warm.  Psychiatric: Memory, affect and judgment normal.    No results found for this basename: TSH   Lab Results  Component Value Date   WBC 5.6 10/12/2011   HGB 15.3 10/12/2011   HCT 46.8 10/12/2011   MCV 84.4 10/12/2011   PLT 139.0* 10/12/2011   Lab Results  Component Value Date   CREATININE 0.9 10/12/2011   BUN 13 10/12/2011   NA 138 10/12/2011   K 4.3 10/12/2011   CL 103 10/12/2011   CO2 28 10/12/2011   Lab Results  Component Value  Date   ALT 31 10/12/2011   AST 31 10/12/2011   ALKPHOS 64 10/12/2011   BILITOT 0.5 10/12/2011   Lab Results  Component Value Date   CHOL 194 10/12/2011   Lab Results  Component Value Date   HDL 46.70 10/12/2011   Lab Results  Component Value Date   LDLCALC 118* 10/12/2011   Lab Results  Component Value Date   TRIG 147.0 10/12/2011   Lab Results  Component Value Date   CHOLHDL 4 10/12/2011     Assessment & Plan  Elevated PSA Has appt with urology tomorrow.   Urinary urgency Improved some since starting Flomax  HTN (hypertension) Is taking his bystolic now agrees to start Chlorthalidone now and we will recheck bp in 1-2 weeks. Avoid sodium, given a copy of DASH diet.

## 2011-11-03 NOTE — Assessment & Plan Note (Signed)
Is taking his bystolic now agrees to start Chlorthalidone now and we will recheck bp in 1-2 weeks. Avoid sodium, given a copy of DASH diet.

## 2011-11-03 NOTE — Assessment & Plan Note (Signed)
Has appt with urology tomorrow

## 2011-11-03 NOTE — Patient Instructions (Signed)

## 2011-11-04 DIAGNOSIS — N401 Enlarged prostate with lower urinary tract symptoms: Secondary | ICD-10-CM | POA: Diagnosis not present

## 2011-11-04 DIAGNOSIS — R972 Elevated prostate specific antigen [PSA]: Secondary | ICD-10-CM | POA: Diagnosis not present

## 2011-11-17 ENCOUNTER — Encounter: Payer: Self-pay | Admitting: Family Medicine

## 2011-11-17 ENCOUNTER — Ambulatory Visit (INDEPENDENT_AMBULATORY_CARE_PROVIDER_SITE_OTHER): Payer: Medicare Other | Admitting: Family Medicine

## 2011-11-17 VITALS — BP 198/96 | HR 58 | Temp 97.8°F | Ht 60.25 in | Wt 137.0 lb

## 2011-11-17 DIAGNOSIS — R972 Elevated prostate specific antigen [PSA]: Secondary | ICD-10-CM

## 2011-11-17 DIAGNOSIS — D696 Thrombocytopenia, unspecified: Secondary | ICD-10-CM | POA: Diagnosis not present

## 2011-11-17 DIAGNOSIS — I10 Essential (primary) hypertension: Secondary | ICD-10-CM | POA: Diagnosis not present

## 2011-11-17 DIAGNOSIS — R3915 Urgency of urination: Secondary | ICD-10-CM

## 2011-11-17 HISTORY — DX: Thrombocytopenia, unspecified: D69.6

## 2011-11-17 MED ORDER — TAMSULOSIN HCL 0.4 MG PO CAPS: 0.4000 mg | ORAL_CAPSULE | Freq: Every day | ORAL | Status: DC

## 2011-11-17 MED ORDER — NEBIVOLOL HCL 10 MG PO TABS: 10.0000 mg | ORAL_TABLET | Freq: Every day | ORAL | Status: DC

## 2011-11-17 MED ORDER — CHLORTHALIDONE 25 MG PO TABS: 25.0000 mg | ORAL_TABLET | ORAL | Status: DC

## 2011-11-17 NOTE — Assessment & Plan Note (Signed)
Improving with addition of Flomax continue the same

## 2011-11-17 NOTE — Patient Instructions (Signed)

## 2011-11-17 NOTE — Progress Notes (Signed)
Patient ID: Steven Morrison, male   DOB: 07/28/1928, 78 y.o.   MRN: [id number] Steven Morrison [id number] Jan 15, 1928 11/17/2011      Progress Note-Follow Up  Subjective  Chief Complaint  Chief Complaint  Patient presents with  . Follow-up    2 week follow up    HPI  Patient is an 78 year old male in today for followup. He continues to take his blood pressure medicines infrequently and has not taken them today. He says he just forgets. He says he feels well. He denies any headache, chest pain or palpitations, shortness of breath, fatigue or other concerns. He has been seen by urology and they believe his elevated PSA his secondary to BPH and are just going to follow him up in 6 months and request old records. He is happy with that plan and does not feel he has any other concerns. Urinary urgency has improved with the addition of Flomax and he is happy with that medication. He denies any fevers, chills or signs of other illness since seen last.  Past Medical History  Diagnosis Date  . HTN (hypertension) 09/12/2011  . Urinary urgency 10/12/2011  . Elevated PSA 11/03/2011    History reviewed. No pertinent past surgical history.  Family History  Problem Relation Age of Onset  . Migraines Daughter   . Allergies Daughter   . Allergies Son     History   Social History  . Marital Status: Divorced    Spouse Name: N/A    Number of Children: N/A  . Years of Education: N/A   Occupational History  . Not on file.   Social History Main Topics  . Smoking status: Never Smoker   . Smokeless tobacco: Never Used  . Alcohol Use: Yes     occasionally  . Drug Use: No  . Sexually Active: No   Other Topics Concern  . Not on file   Social History Narrative  . No narrative on file    Current Outpatient Prescriptions on File Prior to Visit  Medication Sig Dispense Refill  . Coenzyme Q10 (CO Q 10) 100 MG CAPS Take 1 capsule by mouth daily.      Marland Kitchen DISCONTD: chlorthalidone  (HYGROTON) 25 MG tablet Take 1 tablet (25 mg total) by mouth daily.  30 tablet  1  . DISCONTD: nebivolol (BYSTOLIC) 10 MG tablet Take 1 tablet (10 mg total) by mouth daily.  28 tablet    . vitamin C (ASCORBIC ACID) 500 MG tablet Take 1,000 mg by mouth daily.        No Known Allergies  Review of Systems  Review of Systems  Constitutional: Negative for fever and malaise/fatigue.  HENT: Negative for congestion.   Eyes: Negative for discharge.  Respiratory: Negative for shortness of breath.   Cardiovascular: Negative for chest pain, palpitations and leg swelling.  Gastrointestinal: Negative for nausea, abdominal pain and diarrhea.  Genitourinary: Negative for dysuria.  Musculoskeletal: Negative for falls.  Skin: Negative for rash.  Neurological: Negative for loss of consciousness and headaches.  Endo/Heme/Allergies: Negative for polydipsia.  Psychiatric/Behavioral: Negative for depression and suicidal ideas. The patient is not nervous/anxious and does not have insomnia.     Objective  BP 198/96  Pulse 58  Temp(Src) 97.8 F (36.6 C) (Temporal)  Ht 5' 0.25" (1.53 m)  Wt 137 lb (62.143 kg)  BMI 26.53 kg/m2  SpO2 99%  Physical Exam  Physical Exam  Constitutional: He is oriented to person, place, and time and well-developed, well-nourished,  and in no distress. No distress.  HENT:  Head: Normocephalic and atraumatic.  Eyes: Conjunctivae are normal.  Neck: Neck supple. No thyromegaly present.  Cardiovascular: Normal rate, regular rhythm and normal heart sounds.   No murmur heard. Pulmonary/Chest: Effort normal and breath sounds normal. No respiratory distress.  Abdominal: He exhibits no distension and no mass. There is no tenderness.  Musculoskeletal: He exhibits no edema.  Neurological: He is alert and oriented to person, place, and time.  Skin: Skin is warm.  Psychiatric: Memory, affect and judgment normal.    No results found for this basename: TSH   Lab Results    Component Value Date   WBC 5.6 10/12/2011   HGB 15.3 10/12/2011   HCT 46.8 10/12/2011   MCV 84.4 10/12/2011   PLT 139.0* 10/12/2011   Lab Results  Component Value Date   CREATININE 0.9 10/12/2011   BUN 13 10/12/2011   NA 138 10/12/2011   K 4.3 10/12/2011   CL 103 10/12/2011   CO2 28 10/12/2011   Lab Results  Component Value Date   ALT 31 10/12/2011   AST 31 10/12/2011   ALKPHOS 64 10/12/2011   BILITOT 0.5 10/12/2011   Lab Results  Component Value Date   CHOL 194 10/12/2011   Lab Results  Component Value Date   HDL 46.70 10/12/2011   Lab Results  Component Value Date   LDLCALC 118* 10/12/2011   Lab Results  Component Value Date   TRIG 147.0 10/12/2011   Lab Results  Component Value Date   CHOLHDL 4 10/12/2011     Assessment & Plan  HTN (hypertension) Continues to be inconsistent with medication usage, has not taken his meds today. We have given him a specific plan of taking the Chlorthalidone in am and Bystolic and Flomax in am he agrees to take them all routinely and to call if he develops any concerning symptoms. Will need to consider referral if remains elevated, he declines referral today, avoid Sodium  Elevated PSA Is now following with urology, no concerns identified, will follow up with them in 6 months  Urinary urgency Improving with addition of Flomax continue the same

## 2011-11-17 NOTE — Assessment & Plan Note (Signed)
Is now following with urology, no concerns identified, will follow up with them in 6 months

## 2011-11-17 NOTE — Assessment & Plan Note (Signed)
Continues to be inconsistent with medication usage, has not taken his meds today. We have given him a specific plan of taking the Chlorthalidone in am and Bystolic and Flomax in am he agrees to take them all routinely and to call if he develops any concerning symptoms. Will need to consider referral if remains elevated, he declines referral today, avoid Sodium

## 2011-12-23 ENCOUNTER — Ambulatory Visit: Payer: Medicare Other | Admitting: Family Medicine

## 2011-12-30 ENCOUNTER — Encounter: Payer: Self-pay | Admitting: Family Medicine

## 2011-12-30 ENCOUNTER — Ambulatory Visit (INDEPENDENT_AMBULATORY_CARE_PROVIDER_SITE_OTHER): Payer: Medicare Other | Admitting: Family Medicine

## 2011-12-30 VITALS — BP 223/97 | HR 76 | Temp 98.2°F | Ht 60.25 in | Wt 128.8 lb

## 2011-12-30 DIAGNOSIS — D696 Thrombocytopenia, unspecified: Secondary | ICD-10-CM

## 2011-12-30 DIAGNOSIS — R21 Rash and other nonspecific skin eruption: Secondary | ICD-10-CM | POA: Diagnosis not present

## 2011-12-30 DIAGNOSIS — I10 Essential (primary) hypertension: Secondary | ICD-10-CM | POA: Diagnosis not present

## 2011-12-30 DIAGNOSIS — R3915 Urgency of urination: Secondary | ICD-10-CM

## 2011-12-30 NOTE — Patient Instructions (Signed)

## 2011-12-30 NOTE — Assessment & Plan Note (Signed)
Patient stopped all of his medications recently when he had a and episode of feeling light headed. He also had a rash with blisters on his LLE.  He is exercising, doing deep breathing exercises and eating a largely vegetarian diet with extra greens. Patient is declining referral for management of his HTN  He agrees to restart Bystolic 10 mg tab. He is non commital about taking 5- 10 mg daily.

## 2011-12-30 NOTE — Assessment & Plan Note (Signed)
Mild will recheck prior to next visit

## 2012-01-02 ENCOUNTER — Encounter: Payer: Self-pay | Admitting: Family Medicine

## 2012-01-02 DIAGNOSIS — R21 Rash and other nonspecific skin eruption: Secondary | ICD-10-CM

## 2012-01-02 HISTORY — DX: Rash and other nonspecific skin eruption: R21

## 2012-01-02 NOTE — Progress Notes (Signed)
Patient ID: PETERSON MATHEY, male   DOB: 12/04/1927, 78 y.o.   MRN: [id number] Steven Morrison [id number] [birth year] 01/02/2012      Progress Note-Follow Up  Subjective  Chief Complaint  Chief Complaint  Patient presents with  . Follow-up    1 month     HPI  Patient 78 year old male in today for reevaluation of blood pressure. Unfortunately since his last visit he has stopped his medications altogether and his blood pressures very high today. He got of rash on his left calf several weeks ago and decided was a drug rash due to having a similar lesion in the past. So he stopped his Flomax his systolic and his chlorthalidone. He denies headaches, chest pain, palpitations, fatigue, GI or GU complaints as a result. The rash he describes was read with vesicles on top. It was itchy and burning. There are remnants of the rash is still present but they're asymptomatic at this time. He has improved his diet over the last 2 weeks and started exercising mildly again and he is convinced that his blood pressure will come down with these measures. After much discussion he does agree to restart just the Bystolic.  Past Medical History  Diagnosis Date  . HTN (hypertension) 09/12/2011  . Urinary urgency 10/12/2011  . Elevated PSA 11/03/2011  . Thrombocytopenia 11/17/2011    slight    History reviewed. No pertinent past surgical history.  Family History  Problem Relation Age of Onset  . Migraines Daughter   . Allergies Daughter   . Allergies Son     History   Social History  . Marital Status: Divorced    Spouse Name: N/A    Number of Children: N/A  . Years of Education: N/A   Occupational History  . Not on file.   Social History Main Topics  . Smoking status: Never Smoker   . Smokeless tobacco: Never Used  . Alcohol Use: Yes     occasionally  . Drug Use: No  . Sexually Active: No   Other Topics Concern  . Not on file   Social History Narrative  . No narrative on file     Current Outpatient Prescriptions on File Prior to Visit  Medication Sig Dispense Refill  . Coenzyme Q10 (CO Q 10) 100 MG CAPS Take 1 capsule by mouth daily.      . vitamin C (ASCORBIC ACID) 500 MG tablet Take 1,000 mg by mouth daily.      . chlorthalidone (HYGROTON) 25 MG tablet Take 1 tablet (25 mg total) by mouth every morning.  30 tablet  5  . nebivolol (BYSTOLIC) 10 MG tablet Take 1 tablet (10 mg total) by mouth at bedtime.  28 tablet    . Tamsulosin HCl (FLOMAX) 0.4 MG CAPS Take 1 capsule (0.4 mg total) by mouth daily after supper.  30 capsule  3    No Known Allergies  Review of Systems  Review of Systems  Constitutional: Negative for fever and malaise/fatigue.  HENT: Negative for congestion.   Eyes: Negative for discharge.  Respiratory: Negative for shortness of breath.   Cardiovascular: Negative for chest pain, palpitations and leg swelling.  Gastrointestinal: Negative for nausea, abdominal pain and diarrhea.  Genitourinary: Negative for dysuria.  Musculoskeletal: Negative for falls.  Skin: Positive for itching and rash.       Erythemaotus, pruritic rash, burning with vesicles is resolving on left calf  Neurological: Negative for loss of consciousness and headaches.  Endo/Heme/Allergies: Negative for polydipsia.  Psychiatric/Behavioral: Negative for depression and suicidal ideas. The patient is not nervous/anxious and does not have insomnia.     Objective  BP 223/97  Pulse 76  Temp(Src) 98.2 F (36.8 C) (Temporal)  Ht 5' 0.25" (1.53 m)  Wt 128 lb 12.8 oz (58.423 kg)  BMI 24.95 kg/m2  SpO2 100% Repeat bp 198/96  Physical Exam  Physical Exam  Constitutional: He is oriented to person, place, and time and well-developed, well-nourished, and in no distress. No distress.  HENT:  Head: Normocephalic and atraumatic.  Eyes: Conjunctivae are normal.  Neck: Neck supple. No thyromegaly present.  Cardiovascular: Normal rate, regular rhythm and normal heart sounds.    No murmur heard. Pulmonary/Chest: Effort normal and breath sounds normal. No respiratory distress.  Abdominal: He exhibits no distension and no mass. There is no tenderness.  Musculoskeletal: He exhibits no edema.  Neurological: He is alert and oriented to person, place, and time.  Skin: Skin is warm.  Psychiatric: Memory, affect and judgment normal.    No results found for this basename: TSH   Lab Results  Component Value Date   WBC 5.6 10/12/2011   HGB 15.3 10/12/2011   HCT 46.8 10/12/2011   MCV 84.4 10/12/2011   PLT 139.0* 10/12/2011   Lab Results  Component Value Date   CREATININE 0.9 10/12/2011   BUN 13 10/12/2011   NA 138 10/12/2011   K 4.3 10/12/2011   CL 103 10/12/2011   CO2 28 10/12/2011   Lab Results  Component Value Date   ALT 31 10/12/2011   AST 31 10/12/2011   ALKPHOS 64 10/12/2011   BILITOT 0.5 10/12/2011   Lab Results  Component Value Date   CHOL 194 10/12/2011   Lab Results  Component Value Date   HDL 46.70 10/12/2011   Lab Results  Component Value Date   LDLCALC 118* 10/12/2011   Lab Results  Component Value Date   TRIG 147.0 10/12/2011   Lab Results  Component Value Date   CHOLHDL 4 10/12/2011     Assessment & Plan  HTN (hypertension) Patient stopped all of his medications recently when he had a and episode of feeling light headed. He also had a rash with blisters on his LLE.  He is exercising, doing deep breathing exercises and eating a largely vegetarian diet with extra greens. Patient is declining referral for management of his HTN  He agrees to restart Bystolic 10 mg tab. He is non commital about taking 5- 10 mg daily.  Thrombocytopenia Mild will recheck prior to next visit  Urinary urgency Despite stopping his Flomax he reports his urine stream is adequate. Does not wish to restart medication at this time  Rash, skin Patient convinced it is a drug rash. Appears and sounds more like shingles, already mostly resolved. No treatment at this  time

## 2012-01-02 NOTE — Assessment & Plan Note (Signed)
Despite stopping his Flomax he reports his urine stream is adequate. Does not wish to restart medication at this time

## 2012-01-02 NOTE — Assessment & Plan Note (Signed)
Patient convinced it is a drug rash. Appears and sounds more like shingles, already mostly resolved. No treatment at this time

## 2012-02-03 ENCOUNTER — Other Ambulatory Visit (INDEPENDENT_AMBULATORY_CARE_PROVIDER_SITE_OTHER): Payer: Medicare Other

## 2012-02-03 DIAGNOSIS — I10 Essential (primary) hypertension: Secondary | ICD-10-CM

## 2012-02-03 LAB — CBC
Hemoglobin: 15.1 g/dL (ref 13.0–17.0)
Platelets: 124 10*3/uL — ABNORMAL LOW (ref 150.0–400.0)
RBC: 5.45 Mil/uL (ref 4.22–5.81)
WBC: 5.2 10*3/uL (ref 4.5–10.5)

## 2012-02-03 LAB — RENAL FUNCTION PANEL
BUN: 17 mg/dL (ref 6–23)
Chloride: 103 mEq/L (ref 96–112)
GFR: 68.85 mL/min (ref >60.00)
Glucose, Bld: 98 mg/dL (ref 70–99)
Phosphorus: 2.8 mg/dL (ref 2.3–4.6)
Potassium: 3.8 mEq/L (ref 3.5–5.1)

## 2012-02-03 LAB — HEPATIC FUNCTION PANEL
ALT: 26 U/L (ref 0–53)
AST: 28 U/L (ref 0–37)
Albumin: 4 g/dL (ref 3.5–5.2)
Alkaline Phosphatase: 48 U/L (ref 39–117)
Total Protein: 6.9 g/dL (ref 6.0–8.3)

## 2012-02-10 ENCOUNTER — Ambulatory Visit (INDEPENDENT_AMBULATORY_CARE_PROVIDER_SITE_OTHER): Payer: Medicare Other | Admitting: Family Medicine

## 2012-02-10 ENCOUNTER — Encounter: Payer: Self-pay | Admitting: Family Medicine

## 2012-02-10 VITALS — BP 202/76 | HR 63 | Temp 97.3°F | Ht 60.25 in | Wt 137.8 lb

## 2012-02-10 DIAGNOSIS — K5900 Constipation, unspecified: Secondary | ICD-10-CM | POA: Insufficient documentation

## 2012-02-10 DIAGNOSIS — R972 Elevated prostate specific antigen [PSA]: Secondary | ICD-10-CM | POA: Diagnosis not present

## 2012-02-10 DIAGNOSIS — D696 Thrombocytopenia, unspecified: Secondary | ICD-10-CM | POA: Diagnosis not present

## 2012-02-10 DIAGNOSIS — R3915 Urgency of urination: Secondary | ICD-10-CM | POA: Diagnosis not present

## 2012-02-10 DIAGNOSIS — I10 Essential (primary) hypertension: Secondary | ICD-10-CM

## 2012-02-10 HISTORY — DX: Constipation, unspecified: K59.00

## 2012-02-10 LAB — CBC
MCV: 85.7 fl (ref 78.0–100.0)
RBC: 5.32 Mil/uL (ref 4.22–5.81)
RDW: 15.4 % — ABNORMAL HIGH (ref 11.5–14.6)

## 2012-02-10 MED ORDER — NEBIVOLOL HCL 10 MG PO TABS: 5.0000 mg | ORAL_TABLET | ORAL | Status: DC

## 2012-02-10 NOTE — Assessment & Plan Note (Signed)
Platelets have dropped again, will repeat cbc today

## 2012-02-10 NOTE — Patient Instructions (Addendum)
Hypertension As your heart beats, it forces blood through your arteries. This force is your blood pressure. If the pressure is too high, it is called hypertension (HTN) or high blood pressure. HTN is dangerous because you may have it and not know it. High blood pressure may mean that your heart has to work harder to pump blood. Your arteries may be narrow or stiff. The extra work puts you at risk for heart disease, stroke, and other problems.  Blood pressure consists of two numbers, a higher number over a lower, 110/72, for example. It is stated as "110 over 72." The ideal is below 120 for the top number (systolic) and under 80 for the bottom (diastolic). Write down your blood pressure today. You should pay close attention to your blood pressure if you have certain conditions such as:  Heart failure.   Prior heart attack.   Diabetes   Chronic kidney disease.   Prior stroke.   Multiple risk factors for heart disease.  To see if you have HTN, your blood pressure should be measured while you are seated with your arm held at the level of the heart. It should be measured at least twice. A one-time elevated blood pressure reading (especially in the Emergency Department) does not mean that you need treatment. There may be conditions in which the blood pressure is different between your right and left arms. It is important to see your caregiver soon for a recheck. Most people have essential hypertension which means that there is not a specific cause. This type of high blood pressure may be lowered by changing lifestyle factors such as:  Stress.   Smoking.   Lack of exercise.   Excessive weight.   Drug/tobacco/alcohol use.   Eating less salt.  Most people do not have symptoms from high blood pressure until it has caused damage to the body. Effective treatment can often prevent, delay or reduce that damage. TREATMENT  When a cause has been identified, treatment for high blood pressure is  directed at the cause. There are a large number of medications to treat HTN. These fall into several categories, and your caregiver will help you select the medicines that are best for you. Medications may have side effects. You should review side effects with your caregiver. If your blood pressure stays high after you have made lifestyle changes or started on medicines,   Your medication(s) may need to be changed.   Other problems may need to be addressed.   Be certain you understand your prescriptions, and know how and when to take your medicine.   Be sure to follow up with your caregiver within the time frame advised (usually within two weeks) to have your blood pressure rechecked and to review your medications.   If you are taking more than one medicine to lower your blood pressure, make sure you know how and at what times they should be taken. Taking two medicines at the same time can result in blood pressure that is too low.  SEEK IMMEDIATE MEDICAL CARE IF:  You develop a severe headache, blurred or changing vision, or confusion.   You have unusual weakness or numbness, or a faint feeling.   You have severe chest or abdominal pain, vomiting, or breathing problems.  MAKE SURE YOU:   Understand these instructions.   Will watch your condition.   Will get help right away if you are not doing well or get worse.  Document Released: 08/02/2005 Document Revised: 07/22/2011 Document Reviewed:   03/22/2008 Exit[employer] Patient Information 2012 [place], [place].   Stop Chlorthalidone Cut Bystolic 10 mg tab in 1/2 and take it in am. Move the Tamulosin to bedtime and take it daily  Call for further instructions if you do not tolerate the above regimen

## 2012-02-10 NOTE — Assessment & Plan Note (Signed)
Patient took his Bystolic 10 mg daily last night but has not been taking Chlorthalidone or Tamulosin. Will have him to take his Bystolic 5 mg daily in am and have him move the Tamulosin to bedtime

## 2012-02-10 NOTE — Assessment & Plan Note (Signed)
Mild and improving, encouraged MOM in warm prune juice as needed if this recurs and he has gone 2-3 days without a BM again. Daily needs good exercise and fluids, probiotics and fiber

## 2012-02-10 NOTE — Assessment & Plan Note (Signed)
Agrees to return to urology for follow up as instructed in September and to try taking the Tamulosin again

## 2012-02-10 NOTE — Assessment & Plan Note (Addendum)
Has a followup appt with urology in September

## 2012-02-10 NOTE — Progress Notes (Signed)
Patient ID: GILMORE LIST, male   DOB: 02-10-1928, 78 y.o.   MRN: [id number] TARANCE BALAN [id number] 1928/06/07 02/10/2012      Progress Note-Follow Up  Subjective  Chief Complaint  Chief Complaint  Patient presents with  . Follow-up    1 month    HPI  Patient is a 78 year old male in today for follow up on HTN. Once again he has stopped most of his medications since he was last seen. He did take his Crestor 10 mg at night but has not been taking continue chlorthalidone. He reports when he takes all 3 in a days time he feels except somewhat lightheaded and excessively fatigued. He denies any headaches, palpitations, shortness of breath, headache or signs of his blood pressures been running high. He has not checked it elsewhere. He denies any recent acute illness. He's not having any fevers, chills, congestion, GI or GU complaints. He denies any excessive bruising, bleeding gums, hematuria or bloody or tarry stool. He has been eating well and exercising regularly and says overall he feels well.  Past Medical History  Diagnosis Date  . HTN (hypertension) 09/12/2011  . Urinary urgency 10/12/2011  . Elevated PSA 11/03/2011  . Thrombocytopenia 11/17/2011    slight  . Rash, skin 01/02/2012    Left lower leg    History reviewed. No pertinent past surgical history.  Family History  Problem Relation Age of Onset  . Migraines Daughter   . Allergies Daughter   . Allergies Son     History   Social History  . Marital Status: Divorced    Spouse Name: N/A    Number of Children: N/A  . Years of Education: N/A   Occupational History  . Not on file.   Social History Main Topics  . Smoking status: Never Smoker   . Smokeless tobacco: Never Used  . Alcohol Use: Yes     occasionally  . Drug Use: No  . Sexually Active: No   Other Topics Concern  . Not on file   Social History Narrative  . No narrative on file    Current Outpatient Prescriptions on File Prior to Visit    Medication Sig Dispense Refill  . Coenzyme Q10 (CO Q 10) 100 MG CAPS Take 1 capsule by mouth daily.      . vitamin C (ASCORBIC ACID) 500 MG tablet Take 1,000 mg by mouth daily.      . chlorthalidone (HYGROTON) 25 MG tablet Take 1 tablet (25 mg total) by mouth every morning.  30 tablet  5  . nebivolol (BYSTOLIC) 10 MG tablet Take 1 tablet (10 mg total) by mouth at bedtime.  28 tablet    . Tamsulosin HCl (FLOMAX) 0.4 MG CAPS Take 1 capsule (0.4 mg total) by mouth daily after supper.  30 capsule  3    No Known Allergies  Review of Systems  Review of Systems  Constitutional: Positive for malaise/fatigue. Negative for fever.  HENT: Negative for congestion.   Eyes: Negative for discharge.  Respiratory: Negative for shortness of breath.   Cardiovascular: Negative for chest pain, palpitations and leg swelling.  Gastrointestinal: Negative for nausea, abdominal pain and diarrhea.  Genitourinary: Positive for frequency. Negative for dysuria, urgency and flank pain.  Musculoskeletal: Negative for falls.  Skin: Negative for rash.  Neurological: Negative for loss of consciousness and headaches.  Endo/Heme/Allergies: Negative for polydipsia.  Psychiatric/Behavioral: Negative for depression and suicidal ideas. The patient is not nervous/anxious and does not have  insomnia.     Objective  BP 202/76  Pulse 63  Temp 97.3 F (36.3 C) (Temporal)  Ht 5' 0.25" (1.53 m)  Wt 137 lb 12.8 oz (62.506 kg)  BMI 26.69 kg/m2  SpO2 99%  Physical Exam  Physical Exam  Constitutional: He is oriented to person, place, and time and well-developed, well-nourished, and in no distress. No distress.  HENT:  Head: Normocephalic and atraumatic.  Eyes: Conjunctivae are normal.  Neck: Neck supple. No thyromegaly present.  Cardiovascular: Normal rate, regular rhythm and normal heart sounds.   No murmur heard. Pulmonary/Chest: Effort normal and breath sounds normal. No respiratory distress.  Abdominal: He exhibits  no distension and no mass. There is no tenderness.  Musculoskeletal: He exhibits no edema.  Neurological: He is alert and oriented to person, place, and time.  Skin: Skin is warm.  Psychiatric: Memory, affect and judgment normal.    Lab Results  Component Value Date   TSH 2.57 02/03/2012   Lab Results  Component Value Date   WBC 5.2 02/03/2012   HGB 15.1 02/03/2012   HCT 46.3 02/03/2012   MCV 85.0 02/03/2012   PLT 124.0* 02/03/2012   Lab Results  Component Value Date   CREATININE 1.1 02/03/2012   BUN 17 02/03/2012   NA 139 02/03/2012   K 3.8 02/03/2012   CL 103 02/03/2012   CO2 29 02/03/2012   Lab Results  Component Value Date   ALT 26 02/03/2012   AST 28 02/03/2012   ALKPHOS 48 02/03/2012   BILITOT 0.9 02/03/2012   Lab Results  Component Value Date   CHOL 194 10/12/2011   Lab Results  Component Value Date   HDL 46.70 10/12/2011   Lab Results  Component Value Date   LDLCALC 118* 10/12/2011   Lab Results  Component Value Date   TRIG 147.0 10/12/2011   Lab Results  Component Value Date   CHOLHDL 4 10/12/2011     Assessment & Plan  Thrombocytopenia Platelets have dropped again, will repeat cbc today  Urinary urgency Agrees to return to urology for follow up as instructed in September and to try taking the Tamulosin again  HTN (hypertension) Patient took his Bystolic 10 mg daily last night but has not been taking Chlorthalidone or Tamulosin. Will have him to take his Bystolic 5 mg daily in am and have him move the Tamulosin to bedtime  Elevated PSA Has a followup appt with urology in September  Constipation Mild and improving, encouraged MOM in warm prune juice as needed if this recurs and he has gone 2-3 days without a BM again. Daily needs good exercise and fluids, probiotics and fiber

## 2012-02-25 ENCOUNTER — Encounter (INDEPENDENT_AMBULATORY_CARE_PROVIDER_SITE_OTHER): Payer: Medicare Other | Admitting: Family Medicine

## 2012-02-25 DIAGNOSIS — R3915 Urgency of urination: Secondary | ICD-10-CM | POA: Diagnosis not present

## 2012-02-25 MED ORDER — TAMSULOSIN HCL 0.4 MG PO CAPS: 0.4000 mg | ORAL_CAPSULE | Freq: Every day | ORAL | Status: DC

## 2012-02-25 NOTE — Telephone Encounter (Signed)
RX sent

## 2012-03-15 ENCOUNTER — Encounter: Payer: Self-pay | Admitting: Family Medicine

## 2012-03-15 ENCOUNTER — Ambulatory Visit (INDEPENDENT_AMBULATORY_CARE_PROVIDER_SITE_OTHER): Payer: Medicare Other | Admitting: Family Medicine

## 2012-03-15 VITALS — BP 191/80 | HR 64 | Temp 97.2°F | Ht 60.25 in | Wt 139.0 lb

## 2012-03-15 DIAGNOSIS — I10 Essential (primary) hypertension: Secondary | ICD-10-CM

## 2012-03-15 DIAGNOSIS — R3915 Urgency of urination: Secondary | ICD-10-CM | POA: Diagnosis not present

## 2012-03-15 DIAGNOSIS — D696 Thrombocytopenia, unspecified: Secondary | ICD-10-CM | POA: Diagnosis not present

## 2012-03-15 DIAGNOSIS — G4700 Insomnia, unspecified: Secondary | ICD-10-CM

## 2012-03-15 MED ORDER — NEBIVOLOL HCL 10 MG PO TABS: 5.0000 mg | ORAL_TABLET | Freq: Two times a day (BID) | ORAL | Status: DC

## 2012-03-15 NOTE — Patient Instructions (Addendum)

## 2012-03-15 NOTE — Assessment & Plan Note (Signed)
Repeat cbc at next visit, asymptomatic

## 2012-03-15 NOTE — Assessment & Plan Note (Addendum)
Bystolic 10 mg  1/2 tab po qhs, is doing better about taking it regularly although he does miss doses. Improving numbers but will increase to 1/2 tab po bid and recheck at next visit

## 2012-03-15 NOTE — Assessment & Plan Note (Signed)
Doing better, no changes

## 2012-03-15 NOTE — Progress Notes (Signed)
Patient ID: [facility], male   DOB: Dec 11, 1927, 78 y.o.   MRN: [id number] BREN STEERS [id number] Apr 15, 1928 03/15/2012      Progress Note-Follow Up  Subjective  Chief Complaint  Chief Complaint  Patient presents with  . Follow-up    1 month    HPI  78 year old American male in today for followup. Has been taking his insulin more consistently but not every day. Is taking a half a tablet at bedtime and did take it last night. He had been out of 10 he listed for 2 weeks but did not have any worsening of his urinary frequency urgency or hesitancy. He says he feels well. He's not had a recent illness fevers, abdominal pain, back pain, headache since last seen. He denies any neurologic symptoms, chest pain, palpitations, shortness of breath and reports that his insomnia is somewhat better as well,  Past Medical History  Diagnosis Date  . HTN (hypertension) 09/12/2011  . Urinary urgency 10/12/2011  . Elevated PSA 11/03/2011  . Thrombocytopenia 11/17/2011    slight  . Rash, skin 01/02/2012    Left lower leg  . Constipation 02/10/2012    History reviewed. No pertinent past surgical history.  Family History  Problem Relation Age of Onset  . Migraines Daughter   . Allergies Daughter   . Allergies Son     History   Social History  . Marital Status: Divorced    Spouse Name: N/A    Number of Children: N/A  . Years of Education: N/A   Occupational History  . Not on file.   Social History Main Topics  . Smoking status: Never Smoker   . Smokeless tobacco: Never Used  . Alcohol Use: Yes     occasionally  . Drug Use: No  . Sexually Active: No   Other Topics Concern  . Not on file   Social History Narrative  . No narrative on file    Current Outpatient Prescriptions on File Prior to Visit  Medication Sig Dispense Refill  . Coenzyme Q10 (CO Q 10) 100 MG CAPS Take 1 capsule by mouth daily.      . Tamsulosin HCl (FLOMAX) 0.4 MG CAPS Take 1 capsule (0.4 mg total)  by mouth daily after supper.  30 capsule  3  . vitamin C (ASCORBIC ACID) 500 MG tablet Take 1,000 mg by mouth daily.      Marland Kitchen DISCONTD: nebivolol (BYSTOLIC) 10 MG tablet Take 0.5 tablets (5 mg total) by mouth every morning.  28 tablet      No Known Allergies  Review of Systems  Review of Systems  Constitutional: Negative for fever and malaise/fatigue.  HENT: Negative for congestion.   Eyes: Negative for discharge.  Respiratory: Negative for shortness of breath.   Cardiovascular: Negative for chest pain, palpitations and leg swelling.  Gastrointestinal: Negative for nausea, abdominal pain and diarrhea.  Genitourinary: Negative for dysuria.  Musculoskeletal: Negative for falls.  Skin: Negative for rash.  Neurological: Negative for loss of consciousness and headaches.  Endo/Heme/Allergies: Negative for polydipsia.  Psychiatric/Behavioral: Negative for depression and suicidal ideas. The patient is not nervous/anxious and does not have insomnia.     Objective  BP 191/80  Pulse 64  Temp 97.2 F (36.2 C) (Temporal)  Ht 5' 0.25" (1.53 m)  Wt 139 lb (63.05 kg)  BMI 26.92 kg/m2  SpO2 100%  Physical Exam  Physical Exam  Constitutional: He is oriented to person, place, and time and well-developed, well-nourished, and in no  distress. No distress.  HENT:  Head: Normocephalic and atraumatic.  Eyes: Conjunctivae are normal.  Neck: Neck supple. No thyromegaly present.  Cardiovascular: Normal rate, regular rhythm and normal heart sounds.   No murmur heard. Pulmonary/Chest: Effort normal and breath sounds normal. No respiratory distress.  Abdominal: He exhibits no distension and no mass. There is no tenderness.  Musculoskeletal: He exhibits no edema.  Neurological: He is alert and oriented to person, place, and time. He has normal reflexes. No cranial nerve deficit. Gait normal. Coordination normal. GCS score is 15.  Skin: Skin is warm.  Psychiatric: Memory, affect and judgment normal.      Lab Results  Component Value Date   TSH 2.57 02/03/2012   Lab Results  Component Value Date   WBC 5.4 02/10/2012   HGB 14.6 02/10/2012   HCT 45.6 02/10/2012   MCV 85.7 02/10/2012   PLT 139.0* 02/10/2012   Lab Results  Component Value Date   CREATININE 1.1 02/03/2012   BUN 17 02/03/2012   NA 139 02/03/2012   K 3.8 02/03/2012   CL 103 02/03/2012   CO2 29 02/03/2012   Lab Results  Component Value Date   ALT 26 02/03/2012   AST 28 02/03/2012   ALKPHOS 48 02/03/2012   BILITOT 0.9 02/03/2012   Lab Results  Component Value Date   CHOL 194 10/12/2011   Lab Results  Component Value Date   HDL 46.70 10/12/2011   Lab Results  Component Value Date   LDLCALC 118* 10/12/2011   Lab Results  Component Value Date   TRIG 147.0 10/12/2011   Lab Results  Component Value Date   CHOLHDL 4 10/12/2011     Assessment & Plan  HTN (hypertension) Bystolic 10 mg  1/2 tab po qhs, is doing better about taking it regularly although he does miss doses. Improving numbers but will increase to 1/2 tab po bid and recheck at next visit  Urinary urgency Has appt with urology in September, doing well in this regard  Insomnia Doing better, no changes  Thrombocytopenia Repeat cbc at next visit, asymptomatic

## 2012-03-15 NOTE — Assessment & Plan Note (Signed)
Has appt with urology in September, doing well in this regard

## 2012-03-20 ENCOUNTER — Encounter: Payer: Self-pay | Admitting: Family Medicine

## 2012-03-20 ENCOUNTER — Ambulatory Visit (INDEPENDENT_AMBULATORY_CARE_PROVIDER_SITE_OTHER): Payer: Medicare Other | Admitting: Family Medicine

## 2012-03-20 ENCOUNTER — Telehealth: Payer: Self-pay | Admitting: Family Medicine

## 2012-03-20 VITALS — BP 166/79 | HR 61 | Temp 98.2°F | Ht 60.25 in | Wt 137.0 lb

## 2012-03-20 DIAGNOSIS — I10 Essential (primary) hypertension: Secondary | ICD-10-CM

## 2012-03-20 DIAGNOSIS — L538 Other specified erythematous conditions: Secondary | ICD-10-CM | POA: Diagnosis not present

## 2012-03-20 DIAGNOSIS — R21 Rash and other nonspecific skin eruption: Secondary | ICD-10-CM

## 2012-03-20 DIAGNOSIS — L304 Erythema intertrigo: Secondary | ICD-10-CM

## 2012-03-20 HISTORY — DX: Erythema intertrigo: L30.4

## 2012-03-20 MED ORDER — SULFAMETHOXAZOLE-TRIMETHOPRIM 800-160 MG PO TABS: 1.0000 | ORAL_TABLET | Freq: Two times a day (BID) | ORAL | Status: DC

## 2012-03-20 MED ORDER — FLUCONAZOLE 150 MG PO TABS: ORAL_TABLET | ORAL | Status: DC

## 2012-03-20 MED ORDER — ALIGN PO CAPS: 1.0000 | ORAL_CAPSULE | Freq: Every day | ORAL | Status: DC

## 2012-03-20 MED ORDER — NYSTATIN 100000 UNIT/GM EX CREA: TOPICAL_CREAM | Freq: Two times a day (BID) | CUTANEOUS | Status: DC | PRN

## 2012-03-20 NOTE — Assessment & Plan Note (Signed)
Improved with increased bystolic dose at 5 mg bid, no changes to meds today

## 2012-03-20 NOTE — Assessment & Plan Note (Signed)
Mechanical onset now secondary infection, bacterial vs viral started on Bactrim DS, probiotics, Diflucan and Nystatin. Keep open to air and dry. Skin cultured today

## 2012-03-20 NOTE — Progress Notes (Signed)
Patient ID: [facility] Morrison, male   DOB: [birth year], 78 y.o.   MRN: [id number] LOCKLAN CANOY [id number] 07-05-1928 03/20/2012      Progress Note-Follow Up  Subjective  Chief Complaint  Chief Complaint  Patient presents with  . Groin Pain    x 2 weeks    HPI  Patient is an 78 year old male who is in today for evaluation of a rash. He reports over the weekend he was traveling and unable to change his clothing when his skin became irritated. The skin has been mechanically will draw in his intertriginous areas as well as at the tip of the glans and he is in a great deal of discomfort. The skin is not losing, warm and painful. No fevers chills, malaise, myalgias, anorexia, nausea, GI or GU complaints. No chest pain or palpitations. No shortness of breath. He is taking his Bystolic is prescribed 5 mg twice daily and does feel like he is tolerating it well.  Past Medical History  Diagnosis Date  . HTN (hypertension) 09/12/2011  . Urinary urgency 10/12/2011  . Elevated PSA 11/03/2011  . Thrombocytopenia 11/17/2011    slight  . Rash, skin 01/02/2012    Left lower leg  . Constipation 02/10/2012  . Intertrigo 03/20/2012    History reviewed. No pertinent past surgical history.  Family History  Problem Relation Age of Onset  . Migraines Daughter   . Allergies Daughter   . Allergies Son     History   Social History  . Marital Status: Divorced    Spouse Name: N/A    Number of Children: N/A  . Years of Education: N/A   Occupational History  . Not on file.   Social History Main Topics  . Smoking status: Never Smoker   . Smokeless tobacco: Never Used  . Alcohol Use: Yes     occasionally  . Drug Use: No  . Sexually Active: No   Other Topics Concern  . Not on file   Social History Narrative  . No narrative on file    Current Outpatient Prescriptions on File Prior to Visit  Medication Sig Dispense Refill  . Coenzyme Q10 (CO Q 10) 100 MG CAPS Take 1 capsule by mouth  daily.      . nebivolol (BYSTOLIC) 10 MG tablet Take 0.5 tablets (5 mg total) by mouth 2 (two) times daily.  28 tablet    . Tamsulosin HCl (FLOMAX) 0.4 MG CAPS Take 1 capsule (0.4 mg total) by mouth daily after supper.  30 capsule  3  . vitamin C (ASCORBIC ACID) 500 MG tablet Take 1,000 mg by mouth daily.        No Known Allergies  Review of Systems  Review of Systems  Constitutional: Negative for fever and malaise/fatigue.  HENT: Negative for congestion.   Eyes: Negative for discharge.  Respiratory: Negative for shortness of breath.   Cardiovascular: Negative for chest pain, palpitations and leg swelling.  Gastrointestinal: Negative for nausea, abdominal pain and diarrhea.  Genitourinary: Negative for dysuria.  Musculoskeletal: Negative for falls.  Skin: Positive for rash.       Skin in groin b/l broken down, weeping, painful, red. Due to mechanical friction, some breakdown noted mild at tip of glans  Neurological: Negative for loss of consciousness and headaches.  Endo/Heme/Allergies: Negative for polydipsia.  Psychiatric/Behavioral: Negative for depression and suicidal ideas. The patient is not nervous/anxious and does not have insomnia.     Objective  BP 166/79  Pulse 61  Temp 98.2 F (36.8 C) (Temporal)  Ht 5' 0.25" (1.53 m)  Wt 137 lb (62.143 kg)  BMI 26.53 kg/m2  Physical Exam  Physical Exam  Constitutional: He is oriented to person, place, and time and well-developed, well-nourished, and in no distress. No distress.  HENT:  Head: Normocephalic and atraumatic.  Eyes: Conjunctivae are normal.  Neck: Neck supple. No thyromegaly present.  Cardiovascular: Normal rate, regular rhythm and normal heart sounds.   No murmur heard. Pulmonary/Chest: Effort normal and breath sounds normal. No respiratory distress.  Abdominal: He exhibits no distension and no mass. There is no tenderness.  Musculoskeletal: He exhibits no edema.  Neurological: He is alert and oriented to  person, place, and time.  Skin: Skin is warm. Rash noted. There is erythema.       Skin broken down, weeping serous drainage, yellowish drainage. Enlarged LN in groin b/l  Psychiatric: Memory, affect and judgment normal.    Lab Results  Component Value Date   TSH 2.57 02/03/2012   Lab Results  Component Value Date   WBC 5.4 02/10/2012   HGB 14.6 02/10/2012   HCT 45.6 02/10/2012   MCV 85.7 02/10/2012   PLT 139.0* 02/10/2012   Lab Results  Component Value Date   CREATININE 1.1 02/03/2012   BUN 17 02/03/2012   NA 139 02/03/2012   K 3.8 02/03/2012   CL 103 02/03/2012   CO2 29 02/03/2012   Lab Results  Component Value Date   ALT 26 02/03/2012   AST 28 02/03/2012   ALKPHOS 48 02/03/2012   BILITOT 0.9 02/03/2012   Lab Results  Component Value Date   CHOL 194 10/12/2011   Lab Results  Component Value Date   HDL 46.70 10/12/2011   Lab Results  Component Value Date   LDLCALC 118* 10/12/2011   Lab Results  Component Value Date   TRIG 147.0 10/12/2011   Lab Results  Component Value Date   CHOLHDL 4 10/12/2011     Assessment & Plan  HTN (hypertension) Improved with increased bystolic dose at 5 mg bid, no changes to meds today  Intertrigo Mechanical onset now secondary infection, bacterial vs viral started on Bactrim DS, probiotics, Diflucan and Nystatin. Keep open to air and dry. Skin cultured today

## 2012-03-20 NOTE — Patient Instructions (Addendum)
Intertrigo Intertrigo is a skin condition that occurs in between folds of skin in places on the body that rub together a lot and do not get much ventilation. It is caused by heat, moisture, friction, sweat retention, and lack of air circulation, which produces red, irritated patches and, sometimes, scaling or drainage. People who have diabetes, who are obese, or who have treatment with antibiotics are at increased risk for intertrigo. The most common sites for intertrigo to occur include:  The groin.   The breasts.   The armpits.   Folds of abdominal skin.   Webbed spaces between the fingers or toes.  Intertrigo may be aggravated by:  Sweat.   Feces.   Yeast or bacteria that are present near skin folds.   Urine.   Vaginal discharge.  HOME CARE INSTRUCTIONS  The following steps can be taken to reduce friction and keep the affected area cool and dry:   Expose skin folds to the air.   Keep deep skin folds separated with cotton or linen cloth. Avoid tight fitting clothing that could cause chafing.   Wear open-toed shoes or sandals to help reduce moisture between the toes.   Apply absorbent powders to affected areas as directed by your caregiver.   Apply over-the-counter barrier pastes, such as zinc oxide, as directed by your caregiver.   If you develop a fungal infection in the affected area, your caregiver may have you use antifungal creams.  SEEK MEDICAL CARE IF:   The rash is not improving after 1 week of treatment.   The rash is getting worse (more red, more swollen, more painful, or spreading).   You have a fever or chills.  MAKE SURE YOU:   Understand these instructions.   Will watch your condition.   Will get help right away if you are not doing well or get worse.  Document Released: 08/02/2005 Document Revised: 07/22/2011 Document Reviewed: 01/15/2010 [facility] Patient Information 2012 [place], [place].

## 2012-03-20 NOTE — Telephone Encounter (Signed)
Caller: Geron/Patient; PCP: Danise Edge; CB#: [phone]; Call regarding Groin Swollen; Onset est 04/13/12.  Bilateral swelling reported and clear watery fluid drainage.   Afebrile/subjective. Appointment at 1300 with Dr. Abner Greenspan.  Home care for the interim and parameters for callback given.

## 2012-03-24 NOTE — Progress Notes (Signed)
Quick Note:  Patient Informed and voiced understanding. Pt states he feels much better and will continue with the antibiotic.  ______

## 2012-03-25 LAB — WOUND CULTURE: Gram Stain: NONE SEEN

## 2012-03-27 NOTE — Progress Notes (Signed)
Quick Note:  Patient Informed and voiced understanding.  Pt states he is doing better ______

## 2012-03-29 ENCOUNTER — Telehealth: Payer: Self-pay | Admitting: Family Medicine

## 2012-03-29 NOTE — Telephone Encounter (Signed)
Please schedule as early as you can to be seen by Dr [name] tomorrow, I am not sure what treatment he is alluding to

## 2012-03-29 NOTE — Telephone Encounter (Signed)
Patient called asking to be seen ASAP. He said his treatments at [facility] are not working. Patient is agreeable with coming in tomorrow and seeing Dr [name]. Please advise how I should schedule his appt.

## 2012-03-30 ENCOUNTER — Ambulatory Visit (INDEPENDENT_AMBULATORY_CARE_PROVIDER_SITE_OTHER): Payer: Medicare Other | Admitting: Family Medicine

## 2012-03-30 ENCOUNTER — Encounter: Payer: Self-pay | Admitting: Family Medicine

## 2012-03-30 VITALS — BP 153/71 | HR 56 | Temp 97.6°F | Ht 60.25 in | Wt 134.0 lb

## 2012-03-30 DIAGNOSIS — R21 Rash and other nonspecific skin eruption: Secondary | ICD-10-CM

## 2012-03-30 DIAGNOSIS — B356 Tinea cruris: Secondary | ICD-10-CM | POA: Diagnosis not present

## 2012-03-30 MED ORDER — FEXOFENADINE HCL 180 MG PO TABS: 180.0000 mg | ORAL_TABLET | Freq: Every day | ORAL | Status: DC

## 2012-03-30 MED ORDER — TERBINAFINE HCL 250 MG PO TABS: 250.0000 mg | ORAL_TABLET | Freq: Every day | ORAL | Status: DC

## 2012-03-30 NOTE — Telephone Encounter (Signed)
Patient made an appt for OV with Dr [name] today 03/30/12

## 2012-03-30 NOTE — Assessment & Plan Note (Addendum)
Hypersensitivity drug rash vs Id reaction. I favor hypersensitivity drug rash, most likely secondary to bactrim that he has been on lately. Less likely to be from the nystatin or diflucan he was also on. At this point I simply want him to discontinue the med and I rx'd allegra 180mg  po qd prn for itching.  No systemic steroids at this time. See tinea cruris problem above for more info/plan.

## 2012-03-30 NOTE — Assessment & Plan Note (Signed)
Not improving with topical treatment.  At this point, I feel like his infection is all fungal and will not continue any antibacterial treatment. Will start lamisil 250mg  qd x 14d, encouraged pt to keep the area as dry as possible.  No more topical meds. D/C bactrim, nystatin, and diflucan. F/u 1wk, call or return for problems prior to then.

## 2012-03-30 NOTE — Progress Notes (Signed)
OFFICE NOTE  03/30/2012  CC:  Chief Complaint  Patient presents with  . Rash    spreading and itchy     HPI: Patient is a 78 y.o. African-American male who is here for rash. Was seen 03/20/12 for a bad groin rash/intertrigo and was rx'd bactrim DS with nystatin cream and diflucan PO. About 5-7 days into treatment with these meds he broke out in a little red bumpy and itchy rash on trunk and arms, as well as the periphery of his well demarcated groin rash/tinea cruris. No fever, no SOB, no tongue, throat, or facial swelling.  The rash spared the face and scalp, as well as the lower legs.  Denies pain anywhere.  Pertinent PMH:  Past Medical History  Diagnosis Date  . HTN (hypertension) 09/12/2011  . Urinary urgency 10/12/2011  . Elevated PSA 11/03/2011  . Thrombocytopenia 11/17/2011    slight  . Rash, skin 01/02/2012    Left lower leg  . Constipation 02/10/2012  . Intertrigo 03/20/2012    MEDS:  Outpatient Prescriptions Prior to Visit  Medication Sig Dispense Refill  . bifidobacterium infantis (ALIGN) capsule Take 1 capsule by mouth daily.  30 capsule    . Coenzyme Q10 (CO Q 10) 100 MG CAPS Take 1 capsule by mouth daily.      . nebivolol (BYSTOLIC) 10 MG tablet Take 0.5 tablets (5 mg total) by mouth 2 (two) times daily.  28 tablet    . Tamsulosin HCl (FLOMAX) 0.4 MG CAPS Take 1 capsule (0.4 mg total) by mouth daily after supper.  30 capsule  3  . vitamin C (ASCORBIC ACID) 500 MG tablet Take 1,000 mg by mouth daily.      . fluconazole (DIFLUCAN) 150 MG tablet 1 tab weekly x 3 week  3 tablet  1  . nystatin cream (MYCOSTATIN) Apply topically 2 (two) times daily as needed for dry skin.  30 g  1  . sulfamethoxazole-trimethoprim (BACTRIM DS,SEPTRA DS) 800-160 MG per tablet Take 1 tablet by mouth 2 (two) times daily.  20 tablet  0    PE: Blood pressure 153/71, pulse 56, temperature 97.6 F (36.4 C), temperature source Temporal, height 5' 0.25" (1.53 m), weight 134 lb (60.782 kg), SpO2  100.00%. Gen: Alert, well appearing.  Patient is oriented to person, place, time, and situation. Affect: pleasant, very personable. SKIN: morbilliform/maculopapular (blanchable) rash distributed evenly over both arms, back>front of trunk, and extending from the well demarcated border of his tinea groin rash down about 8-10 inches.  His groin has a deep brown/black hue/rash that has well demarcated borders and some mild maceration in the groin creases.  NO vesicles or tenderness.  He has some L>R groin LAD, nontender.  IMPRESSION AND PLAN:  Tinea cruris Not improving with topical treatment.  At this point, I feel like his infection is all fungal and will not continue any antibacterial treatment. Will start lamisil 250mg  qd x 14d, encouraged pt to keep the area as dry as possible.  No more topical meds. D/C bactrim, nystatin, and diflucan. F/u 1wk, call or return for problems prior to then.  Morbilliform rash Hypersensitivity drug rash vs Id reaction. I favor hypersensitivity drug rash, most likely secondary to bactrim that he has been on lately. Less likely to be from the nystatin or diflucan he was also on. At this point I simply want him to discontinue the med and I rx'd allegra 180mg  po qd prn for itching.  No systemic steroids at this time. See  tinea cruris problem above for more info/plan.     FOLLOW UP: 1 wk

## 2012-04-06 ENCOUNTER — Encounter: Payer: Self-pay | Admitting: Family Medicine

## 2012-04-06 ENCOUNTER — Ambulatory Visit (INDEPENDENT_AMBULATORY_CARE_PROVIDER_SITE_OTHER): Payer: Medicare Other | Admitting: Family Medicine

## 2012-04-06 VITALS — BP 198/94 | HR 84 | Temp 97.1°F | Ht 60.25 in | Wt 135.4 lb

## 2012-04-06 DIAGNOSIS — I10 Essential (primary) hypertension: Secondary | ICD-10-CM | POA: Diagnosis not present

## 2012-04-06 DIAGNOSIS — R21 Rash and other nonspecific skin eruption: Secondary | ICD-10-CM

## 2012-04-06 DIAGNOSIS — L304 Erythema intertrigo: Secondary | ICD-10-CM

## 2012-04-06 DIAGNOSIS — L538 Other specified erythematous conditions: Secondary | ICD-10-CM | POA: Diagnosis not present

## 2012-04-06 MED ORDER — NEBIVOLOL HCL 10 MG PO TABS: 5.0000 mg | ORAL_TABLET | Freq: Every day | ORAL | Status: DC

## 2012-04-06 NOTE — Patient Instructions (Addendum)
Drug Rash Skin reactions can be caused by several different drugs. Allergy to the medicine can cause itching, hives, and other rashes. Sun exposure causes a red rash with some medicines. Mononucleosis virus can cause a similar red rash when you are taking antibiotics. Sometimes, the rash may be accompanied by pain. The drug rash may happen with new drugs or with medicines that you have been taking for a while. The rash cannot be spread from person to person. In most cases, the symptoms of a drug rash are gone within a few days of stopping the medicine. Your rash, including hives (urticaria), is most likely from the following medicines:  Antibiotics or antimicrobials.   Anticonvulsants or seizure medicines.   Antihypertensives or blood pressure medicines.   Antimalarials.   Antidepressants or depression medicines.   Antianxiety drugs.   Diuretics or water pills.   Nonsteroidal anti-inflammatory drugs.   Simvastatin.   Lithium.   Omeprazole.   Allopurinol.   Pseudoephedrine.   Amiodarone.   Packed red blood cells, when you get a blood transfusion.   Contrast media, such as when getting an imaging test (CT or CAT scan).  This drug list is not all inclusive, but drug rashes have been reported with all the medicines listed above.Your caregiver will tell you which medicines to avoid. If you react to a medicine, a similar or worse reaction can occur the next time you take it. If you need to stop taking an antibiotic because of a drug rash, an alternative antibiotic may be needed to get rid of your infection. Antihistamine or cortisone drugs may be prescribed to help relieve your symptoms. Stay out of the sun until the rash is completely gone.  Be sure to let your caregiver know about your drug reaction. Do not take this medicine in the future. Call your caregiver if your drug rash does not improve within 3 to 4 days. SEEK IMMEDIATE MEDICAL CARE IF:   You develop breathing problems,  swelling in the throat, or wheezing.   You have weakness, fainting, fever, and muscle or joint pains.   You develop blisters or peeling of skin, especially around the mouth.  Document Released: 09/09/2004 Document Revised: 07/22/2011 Document Reviewed: 06/20/2008 [employer] Patient Information 2012 [employer]. 

## 2012-04-09 ENCOUNTER — Encounter: Payer: Self-pay | Admitting: Family Medicine

## 2012-04-09 NOTE — Assessment & Plan Note (Signed)
Improving, asymptomatic at this time

## 2012-04-09 NOTE — Assessment & Plan Note (Signed)
Once again pressure is up because when his rash occurred he stopped all meds. He agrees to restart Bystolic again which he was tolerating well prior to the rash, he is offered reassurance that the rash was likely the Bactrim. He agrees to start Bystolic at 5mg  daily

## 2012-04-09 NOTE — Assessment & Plan Note (Signed)
Improved

## 2012-04-09 NOTE — Progress Notes (Signed)
Patient ID: Steven Morrison, male   DOB: 1928/07/25, 78 y.o.   MRN: [id number] KOKI BUXTON [id number] 03-24-1928 04/09/2012      Progress Note-Follow Up  Subjective  Chief Complaint  Chief Complaint  Patient presents with  . Follow-up    1 week- Tinea Cruris and Morbilliform drug rash    HPI  Patient is an 78 year old male who is in today for followup on rash and blood pressure. His rash is greatly improved since stopping the Bactrim. The rash on his extremities and chest is trying to is no longer symptomatic. His groin rash is improved as well but not fully gone. No fevers or chills. Unfortunately he stopped his Bystolic as he was worried it was contributing to his rash. Otherwise he denies headache, chest pain, palpitations, shortness of breath, GI or GU complaints.  Past Medical History  Diagnosis Date  . HTN (hypertension) 09/12/2011  . Urinary urgency 10/12/2011  . Elevated PSA 11/03/2011  . Thrombocytopenia 11/17/2011    slight  . Rash, skin 01/02/2012    Left lower leg  . Constipation 02/10/2012  . Intertrigo 03/20/2012    No past surgical history on file.  Family History  Problem Relation Age of Onset  . Migraines Daughter   . Allergies Daughter   . Allergies Son     History   Social History  . Marital Status: Divorced    Spouse Name: N/A    Number of Children: N/A  . Years of Education: N/A   Occupational History  . Not on file.   Social History Main Topics  . Smoking status: Never Smoker   . Smokeless tobacco: Never Used  . Alcohol Use: Yes     occasionally  . Drug Use: No  . Sexually Active: No   Other Topics Concern  . Not on file   Social History Narrative  . No narrative on file    Current Outpatient Prescriptions on File Prior to Visit  Medication Sig Dispense Refill  . fexofenadine (ALLEGRA) 180 MG tablet Take 1 tablet (180 mg total) by mouth daily.  30 tablet  0  . terbinafine (LAMISIL) 250 MG tablet Take 1 tablet (250 mg total)  by mouth daily.  14 tablet  0  . bifidobacterium infantis (ALIGN) capsule Take 1 capsule by mouth daily.  30 capsule    . Coenzyme Q10 (CO Q 10) 100 MG CAPS Take 1 capsule by mouth daily.      . nebivolol (BYSTOLIC) 10 MG tablet Take 0.5 tablets (5 mg total) by mouth daily.  28 tablet    . Tamsulosin HCl (FLOMAX) 0.4 MG CAPS Take 1 capsule (0.4 mg total) by mouth daily after supper.  30 capsule  3  . vitamin C (ASCORBIC ACID) 500 MG tablet Take 1,000 mg by mouth daily.        Allergies  Allergen Reactions  . Bactrim (Sulfamethoxazole W-Trimethoprim) Rash    Review of Systems  Review of Systems  Constitutional: Negative for fever and malaise/fatigue.  HENT: Negative for congestion.   Eyes: Negative for discharge.  Respiratory: Negative for shortness of breath.   Cardiovascular: Negative for chest pain, palpitations and leg swelling.  Gastrointestinal: Negative for nausea, abdominal pain and diarrhea.  Genitourinary: Negative for dysuria.  Musculoskeletal: Negative for falls.  Skin: Positive for rash. Negative for itching.  Neurological: Negative for loss of consciousness and headaches.  Endo/Heme/Allergies: Negative for polydipsia.  Psychiatric/Behavioral: Negative for depression and suicidal ideas. The patient is not nervous/anxious  and does not have insomnia.     Objective  BP 198/94  Pulse 84  Temp 97.1 F (36.2 C) (Temporal)  Ht 5' 0.25" (1.53 m)  Wt 135 lb 6.4 oz (61.417 kg)  BMI 26.22 kg/m2  SpO2 100%  Physical Exam  Physical Exam  Constitutional: He is oriented to person, place, and time and well-developed, well-nourished, and in no distress. No distress.  HENT:  Head: Normocephalic and atraumatic.  Eyes: Conjunctivae are normal.  Neck: Neck supple. No thyromegaly present.  Cardiovascular: Normal rate, regular rhythm and normal heart sounds.   No murmur heard. Pulmonary/Chest: Effort normal and breath sounds normal. No respiratory distress.  Abdominal: He  exhibits no distension and no mass. There is no tenderness.  Musculoskeletal: He exhibits no edema.  Neurological: He is alert and oriented to person, place, and time.  Skin: Skin is warm. Rash noted.       Scattered maculopapular lesions on extremities and chest, slightly raised, no redness or fluctuance  Psychiatric: Memory, affect and judgment normal.    Lab Results  Component Value Date   TSH 2.57 02/03/2012   Lab Results  Component Value Date   WBC 5.4 02/10/2012   HGB 14.6 02/10/2012   HCT 45.6 02/10/2012   MCV 85.7 02/10/2012   PLT 139.0* 02/10/2012   Lab Results  Component Value Date   CREATININE 1.1 02/03/2012   BUN 17 02/03/2012   NA 139 02/03/2012   K 3.8 02/03/2012   CL 103 02/03/2012   CO2 29 02/03/2012   Lab Results  Component Value Date   ALT 26 02/03/2012   AST 28 02/03/2012   ALKPHOS 48 02/03/2012   BILITOT 0.9 02/03/2012   Lab Results  Component Value Date   CHOL 194 10/12/2011   Lab Results  Component Value Date   HDL 46.70 10/12/2011   Lab Results  Component Value Date   LDLCALC 118* 10/12/2011   Lab Results  Component Value Date   TRIG 147.0 10/12/2011   Lab Results  Component Value Date   CHOLHDL 4 10/12/2011     Assessment & Plan  HTN (hypertension) Once again pressure is up because when his rash occurred he stopped all meds. He agrees to restart Bystolic again which he was tolerating well prior to the rash, he is offered reassurance that the rash was likely the Bactrim. He agrees to start Bystolic at 5mg  daily  Morbilliform rash Improved   Intertrigo Improving, asymptomatic at this time

## 2012-04-27 DIAGNOSIS — R972 Elevated prostate specific antigen [PSA]: Secondary | ICD-10-CM | POA: Diagnosis not present

## 2012-05-04 DIAGNOSIS — N401 Enlarged prostate with lower urinary tract symptoms: Secondary | ICD-10-CM | POA: Diagnosis not present

## 2012-05-04 DIAGNOSIS — R972 Elevated prostate specific antigen [PSA]: Secondary | ICD-10-CM | POA: Diagnosis not present

## 2012-05-05 ENCOUNTER — Ambulatory Visit: Payer: Medicare Other | Admitting: Family Medicine

## 2012-05-16 ENCOUNTER — Encounter: Payer: Self-pay | Admitting: Family Medicine

## 2012-05-16 ENCOUNTER — Ambulatory Visit (INDEPENDENT_AMBULATORY_CARE_PROVIDER_SITE_OTHER): Payer: Medicare Other | Admitting: Family Medicine

## 2012-05-16 VITALS — BP 186/80 | HR 60 | Temp 97.7°F | Ht 60.25 in | Wt 138.1 lb

## 2012-05-16 DIAGNOSIS — I10 Essential (primary) hypertension: Secondary | ICD-10-CM | POA: Diagnosis not present

## 2012-05-16 DIAGNOSIS — R972 Elevated prostate specific antigen [PSA]: Secondary | ICD-10-CM | POA: Diagnosis not present

## 2012-05-16 MED ORDER — HYDROCHLOROTHIAZIDE 12.5 MG PO CAPS: 12.5000 mg | ORAL_CAPSULE | Freq: Every day | ORAL | Status: DC

## 2012-05-16 NOTE — Assessment & Plan Note (Signed)
Patient hesitant to start new medications, agrees to try HCTZ 12.5 mg daily in addition to his Bystolic 10 md daily reassess with bp check and renal panel in 6 weeks or as needed. Patient to call with any concerns. Given a handout on potassium rich foods to consume if muscle cramps develop

## 2012-05-16 NOTE — Patient Instructions (Addendum)

## 2012-05-16 NOTE — Progress Notes (Signed)
Patient ID: Steven Morrison, male   DOB: [birth year], 78 y.o.   MRN: [id number] Steven Morrison [id number] 02-06-1928 05/16/2012      Progress Note-Follow Up  Subjective  Chief Complaint  Chief Complaint  Patient presents with  . Follow-up    1 month    HPI  Patient is an 78 year old Caucasian male who is in today for followup. He has been taking his Bystolic daily 10 mg for the last 2 weeks. He reports his systolic was over 200 is now below 200 and he feels well. He denies headache, chest pain, fatigue, palpitations, shortness of breath, constipation, diarrhea, skin cancer. His rash is completely resolved and he says he feels well.  Past Medical History  Diagnosis Date  . HTN (hypertension) 09/12/2011  . Urinary urgency 10/12/2011  . Elevated PSA 11/03/2011  . Thrombocytopenia 11/17/2011    slight  . Rash, skin 01/02/2012    Left lower leg  . Constipation 02/10/2012  . Intertrigo 03/20/2012    History reviewed. No pertinent past surgical history.  Family History  Problem Relation Age of Onset  . Migraines Daughter   . Allergies Daughter   . Allergies Son     History   Social History  . Marital Status: Divorced    Spouse Name: N/A    Number of Children: N/A  . Years of Education: N/A   Occupational History  . Not on file.   Social History Main Topics  . Smoking status: Never Smoker   . Smokeless tobacco: Never Used  . Alcohol Use: Yes     occasionally  . Drug Use: No  . Sexually Active: No   Other Topics Concern  . Not on file   Social History Narrative  . No narrative on file    Current Outpatient Prescriptions on File Prior to Visit  Medication Sig Dispense Refill  . bifidobacterium infantis (ALIGN) capsule Take 1 capsule by mouth daily.  30 capsule    . Coenzyme Q10 (CO Q 10) 100 MG CAPS Take 1 capsule by mouth daily.      . vitamin C (ASCORBIC ACID) 500 MG tablet Take 1,000 mg by mouth daily.      . fexofenadine (ALLEGRA) 180 MG tablet Take 1  tablet (180 mg total) by mouth daily.  30 tablet  0  . hydrochlorothiazide (MICROZIDE) 12.5 MG capsule Take 1 capsule (12.5 mg total) by mouth daily.  30 capsule  3  . nebivolol (BYSTOLIC) 10 MG tablet Take 0.5 tablets (5 mg total) by mouth daily.  28 tablet    . Tamsulosin HCl (FLOMAX) 0.4 MG CAPS Take 1 capsule (0.4 mg total) by mouth daily after supper.  30 capsule  3  . terbinafine (LAMISIL) 250 MG tablet Take 1 tablet (250 mg total) by mouth daily.  14 tablet  0    Allergies  Allergen Reactions  . Bactrim (Sulfamethoxazole W-Trimethoprim) Rash    Review of Systems  Review of Systems  Constitutional: Negative for fever and malaise/fatigue.  HENT: Negative for congestion.   Eyes: Negative for discharge.  Respiratory: Negative for shortness of breath.   Cardiovascular: Negative for chest pain, palpitations and leg swelling.  Gastrointestinal: Negative for nausea, abdominal pain and diarrhea.  Genitourinary: Negative for dysuria.  Musculoskeletal: Negative for falls.  Skin: Negative for rash.  Neurological: Negative for loss of consciousness and headaches.  Endo/Heme/Allergies: Negative for polydipsia.  Psychiatric/Behavioral: Negative for depression and suicidal ideas. The patient is not nervous/anxious and does not have  insomnia.     Objective  BP 198/76  Pulse 54  Temp 97.7 F (36.5 C) (Temporal)  Ht 5' 0.25" (1.53 m)  Wt 138 lb 1.9 oz (62.651 kg)  BMI 26.75 kg/m2  SpO2 100%  Physical Exam  Physical Exam  Constitutional: He is oriented to person, place, and time and well-developed, well-nourished, and in no distress. No distress.  HENT:  Head: Normocephalic and atraumatic.  Eyes: Conjunctivae normal are normal.  Neck: Neck supple. No thyromegaly present.  Cardiovascular: Normal rate, regular rhythm and normal heart sounds.   No murmur heard. Pulmonary/Chest: Effort normal and breath sounds normal. No respiratory distress.  Abdominal: He exhibits no distension  and no mass. There is no tenderness.  Musculoskeletal: He exhibits no edema.  Neurological: He is alert and oriented to person, place, and time.  Skin: Skin is warm.  Psychiatric: Memory, affect and judgment normal.    Lab Results  Component Value Date   TSH 2.57 02/03/2012   Lab Results  Component Value Date   WBC 5.4 02/10/2012   HGB 14.6 02/10/2012   HCT 45.6 02/10/2012   MCV 85.7 02/10/2012   PLT 139.0* 02/10/2012   Lab Results  Component Value Date   CREATININE 1.1 02/03/2012   BUN 17 02/03/2012   NA 139 02/03/2012   K 3.8 02/03/2012   CL 103 02/03/2012   CO2 29 02/03/2012   Lab Results  Component Value Date   ALT 26 02/03/2012   AST 28 02/03/2012   ALKPHOS 48 02/03/2012   BILITOT 0.9 02/03/2012   Lab Results  Component Value Date   CHOL 194 10/12/2011   Lab Results  Component Value Date   HDL 46.70 10/12/2011   Lab Results  Component Value Date   LDLCALC 118* 10/12/2011   Lab Results  Component Value Date   TRIG 147.0 10/12/2011   Lab Results  Component Value Date   CHOLHDL 4 10/12/2011     Assessment & Plan  HTN (hypertension) Patient hesitant to start new medications, agrees to try HCTZ 12.5 mg daily in addition to his Bystolic 10 md daily reassess with bp check and renal panel in 6 weeks or as needed. Patient to call with any concerns. Given a handout on potassium rich foods to consume if muscle cramps develop  Elevated PSA Doing well, no urinary symptoms although he has stopped his Tamulosin. Has been released by urology to follow with [place] will need a repeat PSA in 2/14 or sooner if symptoms develop

## 2012-05-16 NOTE — Assessment & Plan Note (Signed)
Doing well, no urinary symptoms although he has stopped his Tamulosin. Has been released by urology to follow with [place] will need a repeat PSA in 2/14 or sooner if symptoms develop

## 2012-07-05 ENCOUNTER — Ambulatory Visit (INDEPENDENT_AMBULATORY_CARE_PROVIDER_SITE_OTHER): Payer: Medicare Other | Admitting: Family Medicine

## 2012-07-05 ENCOUNTER — Encounter: Payer: Self-pay | Admitting: Family Medicine

## 2012-07-05 VITALS — BP 192/80 | HR 54 | Temp 97.2°F | Ht 60.25 in | Wt 138.8 lb

## 2012-07-05 DIAGNOSIS — B356 Tinea cruris: Secondary | ICD-10-CM | POA: Diagnosis not present

## 2012-07-05 DIAGNOSIS — G4700 Insomnia, unspecified: Secondary | ICD-10-CM | POA: Diagnosis not present

## 2012-07-05 DIAGNOSIS — R3915 Urgency of urination: Secondary | ICD-10-CM

## 2012-07-05 DIAGNOSIS — I10 Essential (primary) hypertension: Secondary | ICD-10-CM | POA: Diagnosis not present

## 2012-07-05 DIAGNOSIS — R3911 Hesitancy of micturition: Secondary | ICD-10-CM

## 2012-07-05 MED ORDER — NEBIVOLOL HCL 10 MG PO TABS: 5.0000 mg | ORAL_TABLET | Freq: Every day | ORAL | Status: DC

## 2012-07-05 MED ORDER — HYDROCHLOROTHIAZIDE 12.5 MG PO CAPS: 12.5000 mg | ORAL_CAPSULE | Freq: Every day | ORAL | Status: DC

## 2012-07-05 MED ORDER — TAMSULOSIN HCL 0.4 MG PO CAPS: 0.4000 mg | ORAL_CAPSULE | Freq: Every day | ORAL | Status: DC

## 2012-07-05 NOTE — Patient Instructions (Addendum)

## 2012-07-05 NOTE — Assessment & Plan Note (Signed)
Resolved, no longer needing meds.

## 2012-07-05 NOTE — Assessment & Plan Note (Addendum)
Only able to sleep 5-6 hours before he wakes up and cannot go back to sleep, he feels fatigued but only at times. Has tried Melatonin but does not find that helpful, discussed need for good sleep hygiene

## 2012-07-05 NOTE — Assessment & Plan Note (Signed)
Has stopped the Flomax again for unclear reasons, agrees to restart and let us know if he has any trouble.

## 2012-07-05 NOTE — Assessment & Plan Note (Signed)
Poorly controlled on current meds. Has stopped taking the hctz again for unclear reasons, agrees to restart the medication and to call for a medication switch if he does not tolerate it

## 2012-07-05 NOTE — Progress Notes (Signed)
Patient ID: Steven Morrison, male   DOB: 11/15/1927, 78 y.o.   MRN: [id number] TEQUAN SEAVERS [id number] 12/21/1927 07/05/2012      Progress Note-Follow Up  Subjective  Chief Complaint  Chief Complaint  Patient presents with  . Follow-up    2 month follow up    HPI  Patient is an 78 year old male who is in today for blood pressure followup. Once again he is stopped some of his medications. He is taking his Bystolic 10 mg routinely. He is not taking his hydrochlorothiazide or his Flomax but he is unable to explain why. Denies any recent illness or concerns. No chest pain, palpitations, shortness of breath, GI or GU complaints. Does note he continues to look good sleep he is able to fall sleep but that has trouble staying asleep. We would not 56 hours. Says some days he feels overly fatigued and other days he feels rested.  Past Medical History  Diagnosis Date  . HTN (hypertension) 09/12/2011  . Urinary urgency 10/12/2011  . Elevated PSA 11/03/2011  . Thrombocytopenia 11/17/2011    slight  . Rash, skin 01/02/2012    Left lower leg  . Constipation 02/10/2012  . Intertrigo 03/20/2012  . Urinary hesitancy 10/12/2011    History reviewed. No pertinent past surgical history.  Family History  Problem Relation Age of Onset  . Migraines Daughter   . Allergies Daughter   . Allergies Son     History   Social History  . Marital Status: Divorced    Spouse Name: N/A    Number of Children: N/A  . Years of Education: N/A   Occupational History  . Not on file.   Social History Main Topics  . Smoking status: Never Smoker   . Smokeless tobacco: Never Used  . Alcohol Use: Yes     Comment: occasionally  . Drug Use: No  . Sexually Active: No   Other Topics Concern  . Not on file   Social History Narrative  . No narrative on file    Current Outpatient Prescriptions on File Prior to Visit  Medication Sig Dispense Refill  . bifidobacterium infantis (ALIGN) capsule Take 1  capsule by mouth daily.  30 capsule    . Coenzyme Q10 (CO Q 10) 100 MG CAPS Take 1 capsule by mouth daily.      . vitamin C (ASCORBIC ACID) 500 MG tablet Take 1,000 mg by mouth daily.      . [DISCONTINUED] nebivolol (BYSTOLIC) 10 MG tablet Take 0.5 tablets (5 mg total) by mouth daily.  28 tablet    . [DISCONTINUED] hydrochlorothiazide (MICROZIDE) 12.5 MG capsule Take 1 capsule (12.5 mg total) by mouth daily.  30 capsule  3    Allergies  Allergen Reactions  . Bactrim (Sulfamethoxazole W-Trimethoprim) Rash    Review of Systems  Review of Systems  Constitutional: Negative for fever and malaise/fatigue.  HENT: Negative for congestion.   Eyes: Negative for discharge.  Respiratory: Negative for shortness of breath.   Cardiovascular: Negative for chest pain, palpitations and leg swelling.  Gastrointestinal: Negative for nausea, abdominal pain and diarrhea.  Genitourinary: Negative for dysuria.  Musculoskeletal: Negative for falls.  Skin: Negative for rash.  Neurological: Negative for loss of consciousness and headaches.  Endo/Heme/Allergies: Negative for polydipsia.  Psychiatric/Behavioral: Negative for depression and suicidal ideas. The patient has insomnia. The patient is not nervous/anxious.     Objective  BP 192/80  Pulse 54  Temp 97.2 F (36.2 C) (Temporal)  Ht 5'  0.25" (1.53 m)  Wt 138 lb 12.8 oz (62.959 kg)  BMI 26.88 kg/m2  SpO2 99%  Physical Exam  Physical Exam  Constitutional: He is oriented to person, place, and time and well-developed, well-nourished, and in no distress. No distress.  HENT:  Head: Normocephalic and atraumatic.  Eyes: Conjunctivae normal are normal.  Neck: Neck supple. No thyromegaly present.  Cardiovascular: Normal rate, regular rhythm and normal heart sounds.   No murmur heard. Pulmonary/Chest: Effort normal and breath sounds normal. No respiratory distress.  Abdominal: He exhibits no distension and no mass. There is no tenderness.    Musculoskeletal: He exhibits no edema.  Neurological: He is alert and oriented to person, place, and time.  Skin: Skin is warm.  Psychiatric: Memory, affect and judgment normal.    Lab Results  Component Value Date   TSH 2.57 02/03/2012   Lab Results  Component Value Date   WBC 5.4 02/10/2012   HGB 14.6 02/10/2012   HCT 45.6 02/10/2012   MCV 85.7 02/10/2012   PLT 139.0* 02/10/2012   Lab Results  Component Value Date   CREATININE 1.1 02/03/2012   BUN 17 02/03/2012   NA 139 02/03/2012   K 3.8 02/03/2012   CL 103 02/03/2012   CO2 29 02/03/2012   Lab Results  Component Value Date   ALT 26 02/03/2012   AST 28 02/03/2012   ALKPHOS 48 02/03/2012   BILITOT 0.9 02/03/2012   Lab Results  Component Value Date   CHOL 194 10/12/2011   Lab Results  Component Value Date   HDL 46.70 10/12/2011   Lab Results  Component Value Date   LDLCALC 118* 10/12/2011   Lab Results  Component Value Date   TRIG 147.0 10/12/2011   Lab Results  Component Value Date   CHOLHDL 4 10/12/2011     Assessment & Plan  Insomnia Only able to sleep 5-6 hours before he wakes up and cannot go back to sleep, he feels fatigued but only at times. Has tried Melatonin but does not find that helpful, discussed need for good sleep hygiene  Tinea cruris Resolved, no longer needing meds.  HTN (hypertension) Poorly controlled on current meds. Has stopped taking the hctz again for unclear reasons, agrees to restart the medication and to call for a medication switch if he does not tolerate it  Urinary hesitancy Has stopped the Flomax again for unclear reasons, agrees to restart and let us know if he has any trouble.

## 2012-08-22 ENCOUNTER — Telehealth: Payer: Self-pay | Admitting: Family Medicine

## 2012-08-22 MED ORDER — DIAZEPAM 5 MG PO TABS: 5.0000 mg | ORAL_TABLET | Freq: Two times a day (BID) | ORAL | Status: DC | PRN

## 2012-08-22 NOTE — Telephone Encounter (Signed)
I cannot clear him for surgery with bp readings like that, he needs to come back in for bp check and increase of dose of med to get his numbers a little better. He can have some Diazepam for anxiety which should help his bp some 5 mg tab 1 tab po bid prn anxiety, disp #10, no rf

## 2012-08-22 NOTE — Telephone Encounter (Signed)
Please advise 

## 2012-08-22 NOTE — Telephone Encounter (Signed)
pts daughter informed. Pt states he has been taking both BP meds for 3 weeks. Appt scheduled for tomorrow morning at 11 and Diazepam sent to pharmacy

## 2012-08-23 ENCOUNTER — Ambulatory Visit (INDEPENDENT_AMBULATORY_CARE_PROVIDER_SITE_OTHER): Payer: Medicare Other | Admitting: Family Medicine

## 2012-08-23 ENCOUNTER — Encounter: Payer: Self-pay | Admitting: Family Medicine

## 2012-08-23 VITALS — BP 172/78 | HR 54 | Temp 98.4°F | Ht 60.25 in | Wt 144.0 lb

## 2012-08-23 DIAGNOSIS — I10 Essential (primary) hypertension: Secondary | ICD-10-CM

## 2012-08-23 MED ORDER — NEBIVOLOL HCL 10 MG PO TABS: 5.0000 mg | ORAL_TABLET | Freq: Two times a day (BID) | ORAL | Status: DC

## 2012-08-23 NOTE — Progress Notes (Signed)
Patient ID: Steven Morrison, male   DOB: [birth year], 79 y.o.   MRN: [id number] MURAT RIDEOUT [id number] December 10, 1927 08/23/2012      Progress Note-Follow Up  Subjective  Chief Complaint  Chief Complaint  Patient presents with  . Hypertension    HPI  Patient is an 79 year old male who is in today for evaluation of hypertension. He was at his dentist yesterday and his blood pressures were running over 200/100. He noticed he was very anxious because he was told he needed a lot of dental work. They asked for surgical clearance and we were unable to provide secondary to his readings. Upon first arrival here his blood pressure was somewhat higher but he didn't pick up and take the diazepam we called in for him about half an hour before he came. He denies any concerning symptoms. He has no dental pain. He denies any fevers chills, headache chest pain or palpitation, shortness of breath, GI or GU complaints. Has not having trouble with the diazepam and does not feel it's causing him any concerns. He has not been taking his HCTZ routinely. He is convinced the rash on his legs recurs when he starts taking too many medications always worry about restarting it.  Past Medical History  Diagnosis Date  . HTN (hypertension) 09/12/2011  . Urinary urgency 10/12/2011  . Elevated PSA 11/03/2011  . Thrombocytopenia 11/17/2011    slight  . Rash, skin 01/02/2012    Left lower leg  . Constipation 02/10/2012  . Intertrigo 03/20/2012  . Urinary hesitancy 10/12/2011    History reviewed. No pertinent past surgical history.  Family History  Problem Relation Age of Onset  . Migraines Daughter   . Allergies Daughter   . Allergies Son     History   Social History  . Marital Status: Divorced    Spouse Name: N/A    Number of Children: N/A  . Years of Education: N/A   Occupational History  . Not on file.   Social History Main Topics  . Smoking status: Never Smoker   . Smokeless tobacco: Never Used  .  Alcohol Use: Yes     Comment: occasionally  . Drug Use: No  . Sexually Active: No   Other Topics Concern  . Not on file   Social History Narrative  . No narrative on file    Current Outpatient Prescriptions on File Prior to Visit  Medication Sig Dispense Refill  . bifidobacterium infantis (ALIGN) capsule Take 1 capsule by mouth daily.  30 capsule    . Coenzyme Q10 (CO Q 10) 100 MG CAPS Take 1 capsule by mouth daily.      . diazepam (VALIUM) 5 MG tablet Take 1 tablet (5 mg total) by mouth 2 (two) times daily as needed.  10 tablet  0  . hydrochlorothiazide (MICROZIDE) 12.5 MG capsule Take 1 capsule (12.5 mg total) by mouth daily. For bp  30 capsule  3  . nebivolol (BYSTOLIC) 10 MG tablet Take 0.5 tablets (5 mg total) by mouth 2 (two) times daily. For bp  28 tablet    . Tamsulosin HCl (FLOMAX) 0.4 MG CAPS Take 1 capsule (0.4 mg total) by mouth daily after supper. For prostate and bladder  30 capsule  3  . vitamin C (ASCORBIC ACID) 500 MG tablet Take 1,000 mg by mouth daily.        Allergies  Allergen Reactions  . Bactrim (Sulfamethoxazole W-Trimethoprim) Rash    Review of Systems  Review of  Systems  Constitutional: Negative for fever and malaise/fatigue.  HENT: Negative for congestion.   Eyes: Negative for discharge.  Respiratory: Negative for shortness of breath.   Cardiovascular: Negative for chest pain, palpitations and leg swelling.  Gastrointestinal: Negative for nausea, abdominal pain and diarrhea.  Genitourinary: Negative for dysuria.  Musculoskeletal: Negative for falls.  Skin: Negative for rash.  Neurological: Negative for loss of consciousness and headaches.  Endo/Heme/Allergies: Negative for polydipsia.  Psychiatric/Behavioral: Negative for depression and suicidal ideas. The patient is nervous/anxious. The patient does not have insomnia.        About dental work    Objective  BP 172/78  Pulse 54  Temp 98.4 F (36.9 C) (Temporal)  Ht 5' 0.25" (1.53 m)  Wt  144 lb (65.318 kg)  BMI 27.89 kg/m2  SpO2 99%  Physical Exam  Physical Exam  Constitutional: He is oriented to person, place, and time and well-developed, well-nourished, and in no distress. No distress.  HENT:  Head: Normocephalic and atraumatic.  Eyes: Conjunctivae normal are normal.  Neck: Neck supple. No thyromegaly present.  Cardiovascular: Normal rate, regular rhythm and normal heart sounds.   No murmur heard. Pulmonary/Chest: Effort normal and breath sounds normal. No respiratory distress.  Abdominal: He exhibits no distension and no mass. There is no tenderness.  Musculoskeletal: He exhibits no edema.  Neurological: He is alert and oriented to person, place, and time.  Skin: Skin is warm.  Psychiatric: Memory, affect and judgment normal.    Lab Results  Component Value Date   TSH 2.57 02/03/2012   Lab Results  Component Value Date   WBC 5.4 02/10/2012   HGB 14.6 02/10/2012   HCT 45.6 02/10/2012   MCV 85.7 02/10/2012   PLT 139.0* 02/10/2012   Lab Results  Component Value Date   CREATININE 1.1 02/03/2012   BUN 17 02/03/2012   NA 139 02/03/2012   K 3.8 02/03/2012   CL 103 02/03/2012   CO2 29 02/03/2012   Lab Results  Component Value Date   ALT 26 02/03/2012   AST 28 02/03/2012   ALKPHOS 48 02/03/2012   BILITOT 0.9 02/03/2012   Lab Results  Component Value Date   CHOL 194 10/12/2011   Lab Results  Component Value Date   HDL 46.70 10/12/2011   Lab Results  Component Value Date   LDLCALC 118* 10/12/2011   Lab Results  Component Value Date   TRIG 147.0 10/12/2011   Lab Results  Component Value Date   CHOLHDL 4 10/12/2011     Assessment & Plan  HTN (hypertension) Improved with recheck, has not been taking his HCTZ agrees to restart this back and to cont Bystolic 5 mg bid and will use 2.5 to 5 mg ov Diazepam prior to dental and doctor's appt til his dental work is done, recheck bp next week and if better will clear for dental surgery later this month

## 2012-08-23 NOTE — Assessment & Plan Note (Signed)
Improved with recheck, has not been taking his HCTZ agrees to restart this back and to cont Bystolic 5 mg bid and will use 2.5 to 5 mg ov Diazepam prior to dental and doctor's appt til his dental work is done, recheck bp next week and if better will clear for dental surgery later this month

## 2012-08-28 ENCOUNTER — Ambulatory Visit: Payer: Medicare Other | Admitting: Family Medicine

## 2012-08-30 ENCOUNTER — Encounter: Payer: Self-pay | Admitting: Family Medicine

## 2012-08-30 ENCOUNTER — Ambulatory Visit (INDEPENDENT_AMBULATORY_CARE_PROVIDER_SITE_OTHER): Payer: Medicare Other | Admitting: Family Medicine

## 2012-08-30 VITALS — BP 178/82 | HR 57 | Temp 97.8°F | Ht 60.25 in | Wt 140.8 lb

## 2012-08-30 DIAGNOSIS — F411 Generalized anxiety disorder: Secondary | ICD-10-CM

## 2012-08-30 DIAGNOSIS — F419 Anxiety disorder, unspecified: Secondary | ICD-10-CM

## 2012-08-30 DIAGNOSIS — I10 Essential (primary) hypertension: Secondary | ICD-10-CM | POA: Diagnosis not present

## 2012-08-30 MED ORDER — DIAZEPAM 5 MG PO TABS: ORAL_TABLET | ORAL | Status: DC

## 2012-08-30 MED ORDER — HYDROCHLOROTHIAZIDE 12.5 MG PO CAPS: 12.5000 mg | ORAL_CAPSULE | Freq: Two times a day (BID) | ORAL | Status: DC

## 2012-08-30 NOTE — Assessment & Plan Note (Signed)
Improving, still needs some more improvement before we will be able to clear him for oral surgery. He is supposed to have oral surgery with Dr Royston Sinner, DDS, phone [phone], fax [phone].

## 2012-08-30 NOTE — Patient Instructions (Signed)
Starting today start taking 2 Hydrochlorothiazide 12.5 mg tabs each morning. Then later this week try taking Diazepam 5 mg tabs 2 at once to make sure this does not cause excessive sedation. If not next week when you come back for bp check take 2 HCTZ, Bystolic and 2 Diazepams that morning.

## 2012-08-30 NOTE — Progress Notes (Signed)
Patient ID: Steven Morrison, male   DOB: 1928-05-28, 79 y.o.   MRN: [id number] GERARDO CAIAZZO [id number] 1928-08-08 08/30/2012      Progress Note-Follow Up  Subjective  Chief Complaint  Chief Complaint  Patient presents with  . Follow-up    1 week    HPI  Patient is a 79 year old male in today for blood pressure check. He is scheduled to have oral surgery multiple teeth pulled later this month or early next month and his blood pressure continues to run high. He was hesitant to restart HCTZ last week as he felt as if they contributed to a skin rash. Fortunately he did take it and he did not have any recurrence of his skin rash. He says he has tolerated it fine he said no trouble with his diazepam either area offers no acute complaints. No chest pain, headache and palpitations, shortness of breath, GI or GU complaints at this time. He is taking his disability and his HCTZ daily and his diazepam intermittently  Past Medical History  Diagnosis Date  . HTN (hypertension) 09/12/2011  . Urinary urgency 10/12/2011  . Elevated PSA 11/03/2011  . Thrombocytopenia 11/17/2011    slight  . Rash, skin 01/02/2012    Left lower leg  . Constipation 02/10/2012  . Intertrigo 03/20/2012  . Urinary hesitancy 10/12/2011    History reviewed. No pertinent past surgical history.  Family History  Problem Relation Age of Onset  . Migraines Daughter   . Allergies Daughter   . Allergies Son     History   Social History  . Marital Status: Divorced    Spouse Name: N/A    Number of Children: N/A  . Years of Education: N/A   Occupational History  . Not on file.   Social History Main Topics  . Smoking status: Never Smoker   . Smokeless tobacco: Never Used  . Alcohol Use: Yes     Comment: occasionally  . Drug Use: No  . Sexually Active: No   Other Topics Concern  . Not on file   Social History Narrative  . No narrative on file    Current Outpatient Prescriptions on File Prior to Visit    Medication Sig Dispense Refill  . bifidobacterium infantis (ALIGN) capsule Take 1 capsule by mouth daily.  30 capsule    . Coenzyme Q10 (CO Q 10) 100 MG CAPS Take 1 capsule by mouth daily.      . hydrochlorothiazide (MICROZIDE) 12.5 MG capsule Take 1 capsule (12.5 mg total) by mouth 2 (two) times daily. For bp Both in the morning  30 capsule  3  . nebivolol (BYSTOLIC) 10 MG tablet Take 0.5 tablets (5 mg total) by mouth 2 (two) times daily. For bp  28 tablet    . Tamsulosin HCl (FLOMAX) 0.4 MG CAPS Take 1 capsule (0.4 mg total) by mouth daily after supper. For prostate and bladder  30 capsule  3  . vitamin C (ASCORBIC ACID) 500 MG tablet Take 1,000 mg by mouth daily.        Allergies  Allergen Reactions  . Bactrim (Sulfamethoxazole W-Trimethoprim) Rash    Review of Systems  Review of Systems  Constitutional: Negative for fever and malaise/fatigue.  HENT: Negative for congestion.   Eyes: Negative for discharge.  Respiratory: Negative for shortness of breath.   Cardiovascular: Negative for chest pain, palpitations and leg swelling.  Gastrointestinal: Negative for nausea, abdominal pain and diarrhea.  Genitourinary: Negative for dysuria.  Musculoskeletal:  Negative for falls.  Skin: Negative for rash.  Neurological: Negative for loss of consciousness and headaches.  Endo/Heme/Allergies: Negative for polydipsia.  Psychiatric/Behavioral: Negative for depression and suicidal ideas. The patient is not nervous/anxious and does not have insomnia.     Objective  BP 178/82  Pulse 57  Temp 97.8 F (36.6 C) (Temporal)  Ht 5' 0.25" (1.53 m)  Wt 140 lb 12.8 oz (63.866 kg)  BMI 27.27 kg/m2  SpO2 100%  Physical Exam  Physical Exam  Constitutional: He is oriented to person, place, and time and well-developed, well-nourished, and in no distress. No distress.  HENT:  Head: Normocephalic and atraumatic.  Eyes: Conjunctivae normal are normal.  Neck: Neck supple. No thyromegaly present.   Cardiovascular: Normal rate, regular rhythm and normal heart sounds.   No murmur heard. Pulmonary/Chest: Effort normal and breath sounds normal. No respiratory distress.  Abdominal: He exhibits no distension and no mass. There is no tenderness.  Musculoskeletal: He exhibits no edema.  Neurological: He is alert and oriented to person, place, and time.  Skin: Skin is warm. He is not diaphoretic.  Psychiatric: Memory, affect and judgment normal.    Lab Results  Component Value Date   TSH 2.57 02/03/2012   Lab Results  Component Value Date   WBC 5.4 02/10/2012   HGB 14.6 02/10/2012   HCT 45.6 02/10/2012   MCV 85.7 02/10/2012   PLT 139.0* 02/10/2012   Lab Results  Component Value Date   CREATININE 1.1 02/03/2012   BUN 17 02/03/2012   NA 139 02/03/2012   K 3.8 02/03/2012   CL 103 02/03/2012   CO2 29 02/03/2012   Lab Results  Component Value Date   ALT 26 02/03/2012   AST 28 02/03/2012   ALKPHOS 48 02/03/2012   BILITOT 0.9 02/03/2012   Lab Results  Component Value Date   CHOL 194 10/12/2011   Lab Results  Component Value Date   HDL 46.70 10/12/2011   Lab Results  Component Value Date   LDLCALC 118* 10/12/2011   Lab Results  Component Value Date   TRIG 147.0 10/12/2011   Lab Results  Component Value Date   CHOLHDL 4 10/12/2011     Assessment & Plan  HTN (hypertension) Improving, still needs some more improvement before we will be able to clear him for oral surgery. He is supposed to have oral surgery with Dr Royston Sinner, DDS, phone [phone], fax [phone].

## 2012-09-04 ENCOUNTER — Telehealth: Payer: Self-pay | Admitting: Family Medicine

## 2012-09-04 NOTE — Telephone Encounter (Signed)
This RX was printed. Pt informed and states he is going to look for it and call us back if he can't find it.

## 2012-09-06 ENCOUNTER — Encounter: Payer: Self-pay | Admitting: Family Medicine

## 2012-09-06 ENCOUNTER — Ambulatory Visit: Payer: Medicare Other | Admitting: Family Medicine

## 2012-09-06 ENCOUNTER — Ambulatory Visit (INDEPENDENT_AMBULATORY_CARE_PROVIDER_SITE_OTHER): Payer: Medicare Other | Admitting: Family Medicine

## 2012-09-06 VITALS — BP 190/92 | HR 65 | Temp 97.8°F | Ht 60.25 in | Wt 141.8 lb

## 2012-09-06 DIAGNOSIS — F419 Anxiety disorder, unspecified: Secondary | ICD-10-CM

## 2012-09-06 DIAGNOSIS — F411 Generalized anxiety disorder: Secondary | ICD-10-CM | POA: Diagnosis not present

## 2012-09-06 DIAGNOSIS — I10 Essential (primary) hypertension: Secondary | ICD-10-CM

## 2012-09-06 MED ORDER — LOSARTAN POTASSIUM 25 MG PO TABS: 25.0000 mg | ORAL_TABLET | Freq: Every day | ORAL | Status: DC

## 2012-09-06 MED ORDER — HYDROCHLOROTHIAZIDE 12.5 MG PO CAPS: 12.5000 mg | ORAL_CAPSULE | ORAL | Status: DC

## 2012-09-06 MED ORDER — DIAZEPAM 5 MG PO TABS: ORAL_TABLET | ORAL | Status: DC

## 2012-09-06 NOTE — Patient Instructions (Addendum)

## 2012-09-07 ENCOUNTER — Encounter: Payer: Self-pay | Admitting: Family Medicine

## 2012-09-10 NOTE — Assessment & Plan Note (Signed)
Still elevated despite elevated HCTZ and increased Diazepam, he will stop all Diazepam except when he has his dental work done, will drop his HCTZ to 12.5 mg daily again and start Losartan 25 mg daily.

## 2012-09-10 NOTE — Progress Notes (Signed)
Patient ID: Steven Morrison, male   DOB: 1928/01/22, 79 y.o.   MRN: [id number] MALICK NETZ [id number] 08-03-1928 09/10/2012      Progress Note-Follow Up  Subjective  Chief Complaint  Chief Complaint  Patient presents with  . Follow-up    1 week    HPI  Patient is an 79 year old male who is in today, and by his daughter. He has been taking his meds as prescribed unfortunately his blood pressure continues to run high. He was asked to take some diazepam 10 mg before coming in today but unfortunately it has not helped his blood pressure. He did double HCTZ as instructed and unfortunately has had a worsening of the rash on his lower extremities as a result. The lesions are more erythematous and warm as well as more pruritic. Otherwise she denies any acute complaints. No fevers, chest pain, palpitations, headache, GI or GU concerns noted today.  Past Medical History  Diagnosis Date  . HTN (hypertension) 09/12/2011  . Urinary urgency 10/12/2011  . Elevated PSA 11/03/2011  . Thrombocytopenia 11/17/2011    slight  . Rash, skin 01/02/2012    Left lower leg  . Constipation 02/10/2012  . Intertrigo 03/20/2012  . Urinary hesitancy 10/12/2011    History reviewed. No pertinent past surgical history.  Family History  Problem Relation Age of Onset  . Migraines Daughter   . Allergies Daughter   . Allergies Son     History   Social History  . Marital Status: Divorced    Spouse Name: N/A    Number of Children: N/A  . Years of Education: N/A   Occupational History  . Not on file.   Social History Main Topics  . Smoking status: Never Smoker   . Smokeless tobacco: Never Used  . Alcohol Use: Yes     Comment: occasionally  . Drug Use: No  . Sexually Active: No   Other Topics Concern  . Not on file   Social History Narrative  . No narrative on file    Current Outpatient Prescriptions on File Prior to Visit  Medication Sig Dispense Refill  . bifidobacterium infantis  (ALIGN) capsule Take 1 capsule by mouth daily.  30 capsule    . Coenzyme Q10 (CO Q 10) 100 MG CAPS Take 1 capsule by mouth daily.      . hydrochlorothiazide (MICROZIDE) 12.5 MG capsule Take 1 capsule (12.5 mg total) by mouth every morning. For bp Both in the morning  30 capsule  3  . nebivolol (BYSTOLIC) 10 MG tablet Take 0.5 tablets (5 mg total) by mouth 2 (two) times daily. For bp  28 tablet    . Tamsulosin HCl (FLOMAX) 0.4 MG CAPS Take 1 capsule (0.4 mg total) by mouth daily after supper. For prostate and bladder  30 capsule  3  . vitamin C (ASCORBIC ACID) 500 MG tablet Take 1,000 mg by mouth daily.      Marland Kitchen losartan (COZAAR) 25 MG tablet Take 1 tablet (25 mg total) by mouth daily. In pm  30 tablet  2    Allergies  Allergen Reactions  . Bactrim (Sulfamethoxazole W-Trimethoprim) Rash    Review of Systems  Review of Systems  Constitutional: Negative for fever and malaise/fatigue.  HENT: Negative for congestion.   Eyes: Negative for discharge.  Respiratory: Negative for shortness of breath.   Cardiovascular: Negative for chest pain, palpitations and leg swelling.  Gastrointestinal: Negative for nausea, abdominal pain and diarrhea.  Genitourinary: Negative for dysuria.  Musculoskeletal: Negative for falls.  Skin: Positive for itching and rash.       B/l lower calves since increasing HCTZ  Neurological: Negative for loss of consciousness and headaches.  Endo/Heme/Allergies: Negative for polydipsia.  Psychiatric/Behavioral: Negative for depression and suicidal ideas. The patient is not nervous/anxious and does not have insomnia.     Objective  BP 190/92  Pulse 65  Temp 97.8 F (36.6 C) (Temporal)  Ht 5' 0.25" (1.53 m)  Wt 141 lb 12.8 oz (64.32 kg)  BMI 27.46 kg/m2  SpO2 98%  Physical Exam  Physical Exam  Constitutional: He is oriented to person, place, and time and well-developed, well-nourished, and in no distress. No distress.  HENT:  Head: Normocephalic and atraumatic.    Eyes: Conjunctivae normal are normal.  Neck: Neck supple. No thyromegaly present.  Cardiovascular: Normal rate, regular rhythm and normal heart sounds.   No murmur heard. Pulmonary/Chest: Effort normal and breath sounds normal. No respiratory distress.  Abdominal: He exhibits no distension and no mass. There is no tenderness.  Musculoskeletal: He exhibits no edema.  Neurological: He is alert and oriented to person, place, and time.  Skin: Skin is warm. Rash noted. There is erythema.       Raised diffuse maculopapular excoriated lesins on b/l lower calves.  Psychiatric: Memory, affect and judgment normal.    Lab Results  Component Value Date   TSH 2.57 02/03/2012   Lab Results  Component Value Date   WBC 5.4 02/10/2012   HGB 14.6 02/10/2012   HCT 45.6 02/10/2012   MCV 85.7 02/10/2012   PLT 139.0* 02/10/2012   Lab Results  Component Value Date   CREATININE 1.1 02/03/2012   BUN 17 02/03/2012   NA 139 02/03/2012   K 3.8 02/03/2012   CL 103 02/03/2012   CO2 29 02/03/2012   Lab Results  Component Value Date   ALT 26 02/03/2012   AST 28 02/03/2012   ALKPHOS 48 02/03/2012   BILITOT 0.9 02/03/2012   Lab Results  Component Value Date   CHOL 194 10/12/2011   Lab Results  Component Value Date   HDL 46.70 10/12/2011   Lab Results  Component Value Date   LDLCALC 118* 10/12/2011   Lab Results  Component Value Date   TRIG 147.0 10/12/2011   Lab Results  Component Value Date   CHOLHDL 4 10/12/2011     Assessment & Plan  HTN (hypertension) Still elevated despite elevated HCTZ and increased Diazepam, he will stop all Diazepam except when he has his dental work done, will drop his HCTZ to 12.5 mg daily again and start Losartan 25 mg daily.

## 2012-09-13 ENCOUNTER — Ambulatory Visit (INDEPENDENT_AMBULATORY_CARE_PROVIDER_SITE_OTHER): Payer: Medicare Other | Admitting: Family Medicine

## 2012-09-13 ENCOUNTER — Encounter: Payer: Self-pay | Admitting: Family Medicine

## 2012-09-13 VITALS — BP 164/72 | HR 63 | Temp 97.8°F | Ht 60.25 in | Wt 140.1 lb

## 2012-09-13 DIAGNOSIS — L259 Unspecified contact dermatitis, unspecified cause: Secondary | ICD-10-CM | POA: Diagnosis not present

## 2012-09-13 DIAGNOSIS — I10 Essential (primary) hypertension: Secondary | ICD-10-CM

## 2012-09-13 DIAGNOSIS — L309 Dermatitis, unspecified: Secondary | ICD-10-CM | POA: Insufficient documentation

## 2012-09-13 HISTORY — DX: Dermatitis, unspecified: L30.9

## 2012-09-13 MED ORDER — LOSARTAN POTASSIUM 50 MG PO TABS: 50.0000 mg | ORAL_TABLET | Freq: Every day | ORAL | Status: DC

## 2012-09-13 NOTE — Progress Notes (Signed)
Patient ID: Steven Morrison, male   DOB: 12-Nov-1927, 79 y.o.   MRN: [id number] Steven Morrison [id number] [birth year] 09/13/2012      Progress Note-Follow Up  Subjective  Chief Complaint  Chief Complaint  Patient presents with  . Follow-up    1 week    HPI  Patient is an 79 year old male who is in today for followup on his blood pressure. He has been taking his medications as prescribed and has tolerated the addition of losartan. No difficulties or adverse reactions are noted. He says he feels well. Denies headache, chest pain, palpitations, shortness of breath, GI or GU complaints.  Past Medical History  Diagnosis Date  . HTN (hypertension) 09/12/2011  . Urinary urgency 10/12/2011  . Elevated PSA 11/03/2011  . Thrombocytopenia 11/17/2011    slight  . Rash, skin 01/02/2012    Left lower leg  . Constipation 02/10/2012  . Intertrigo 03/20/2012  . Urinary hesitancy 10/12/2011    History reviewed. No pertinent past surgical history.  Family History  Problem Relation Age of Onset  . Migraines Daughter   . Allergies Daughter   . Allergies Son     History   Social History  . Marital Status: Divorced    Spouse Name: N/A    Number of Children: N/A  . Years of Education: N/A   Occupational History  . Not on file.   Social History Main Topics  . Smoking status: Never Smoker   . Smokeless tobacco: Never Used  . Alcohol Use: Yes     Comment: occasionally  . Drug Use: No  . Sexually Active: No   Other Topics Concern  . Not on file   Social History Narrative  . No narrative on file    Current Outpatient Prescriptions on File Prior to Visit  Medication Sig Dispense Refill  . bifidobacterium infantis (ALIGN) capsule Take 1 capsule by mouth daily.  30 capsule    . Coenzyme Q10 (CO Q 10) 100 MG CAPS Take 1 capsule by mouth daily.      . diazepam (VALIUM) 5 MG tablet 1-2 tabs po daily prn anxiety  20 tablet  0  . hydrochlorothiazide (MICROZIDE) 12.5 MG capsule Take 1  capsule (12.5 mg total) by mouth every morning. For bp Both in the morning  30 capsule  3  . nebivolol (BYSTOLIC) 10 MG tablet Take 0.5 tablets (5 mg total) by mouth 2 (two) times daily. For bp  28 tablet    . Tamsulosin HCl (FLOMAX) 0.4 MG CAPS Take 1 capsule (0.4 mg total) by mouth daily after supper. For prostate and bladder  30 capsule  3  . vitamin C (ASCORBIC ACID) 500 MG tablet Take 1,000 mg by mouth daily.        Allergies  Allergen Reactions  . Bactrim (Sulfamethoxazole W-Trimethoprim) Rash    Review of Systems  Review of Systems  Constitutional: Negative for fever and malaise/fatigue.  HENT: Negative for congestion.   Eyes: Negative for pain and discharge.  Respiratory: Negative for shortness of breath.   Cardiovascular: Negative for chest pain, palpitations and leg swelling.  Gastrointestinal: Negative for nausea, abdominal pain and diarrhea.  Genitourinary: Negative for dysuria.  Musculoskeletal: Negative for falls.  Skin: Positive for itching and rash.       Rash on legs improving, no itching. Back is itchy with dry skin but no rash  Neurological: Negative for loss of consciousness and headaches.  Endo/Heme/Allergies: Negative for polydipsia.  Psychiatric/Behavioral: Negative for depression and  suicidal ideas. The patient is not nervous/anxious and does not have insomnia.     Objective  BP 164/72  Pulse 63  Temp 97.8 F (36.6 C) (Temporal)  Ht 5' 0.25" (1.53 m)  Wt 140 lb 1.9 oz (63.558 kg)  BMI 27.14 kg/m2  SpO2 94%  Physical Exam  Physical Exam  Constitutional: He is oriented to person, place, and time and well-developed, well-nourished, and in no distress. No distress.  HENT:  Head: Normocephalic and atraumatic.  Eyes: Conjunctivae normal are normal.  Neck: Neck supple. No thyromegaly present.  Cardiovascular: Normal rate, regular rhythm and normal heart sounds.   No murmur heard. Pulmonary/Chest: Effort normal and breath sounds normal. No  respiratory distress.  Abdominal: He exhibits no distension and no mass. There is no tenderness.  Musculoskeletal: He exhibits no edema.  Neurological: He is alert and oriented to person, place, and time.  Skin: Skin is warm.  Psychiatric: Memory, affect and judgment normal.    Lab Results  Component Value Date   TSH 2.57 02/03/2012   Lab Results  Component Value Date   WBC 5.4 02/10/2012   HGB 14.6 02/10/2012   HCT 45.6 02/10/2012   MCV 85.7 02/10/2012   PLT 139.0* 02/10/2012   Lab Results  Component Value Date   CREATININE 1.1 02/03/2012   BUN 17 02/03/2012   NA 139 02/03/2012   K 3.8 02/03/2012   CL 103 02/03/2012   CO2 29 02/03/2012   Lab Results  Component Value Date   ALT 26 02/03/2012   AST 28 02/03/2012   ALKPHOS 48 02/03/2012   BILITOT 0.9 02/03/2012   Lab Results  Component Value Date   CHOL 194 10/12/2011   Lab Results  Component Value Date   HDL 46.70 10/12/2011   Lab Results  Component Value Date   LDLCALC 118* 10/12/2011   Lab Results  Component Value Date   TRIG 147.0 10/12/2011   Lab Results  Component Value Date   CHOLHDL 4 10/12/2011     Assessment & Plan  HTN (hypertension) Improving but not fully controlled will increase Losartan to 50 mg and recheck bp in 1 week. Continue same dose of Bystolic and HCTZ  Dermatitis B/l legs improving with decrease hctz

## 2012-09-13 NOTE — Assessment & Plan Note (Signed)
Improving but not fully controlled will increase Losartan to 50 mg and recheck bp in 1 week. Continue same dose of Bystolic and HCTZ

## 2012-09-13 NOTE — Assessment & Plan Note (Signed)
B/l legs improving with decrease hctz

## 2012-09-13 NOTE — Patient Instructions (Addendum)

## 2012-09-25 ENCOUNTER — Telehealth: Payer: Self-pay | Admitting: Family Medicine

## 2012-09-25 NOTE — Telephone Encounter (Signed)
So patient is improving but still not good enough, has another appt on 2/21 I believe he will be better and then I will clear him.

## 2012-09-25 NOTE — Telephone Encounter (Signed)
pts daughter informed and voiced understanding 

## 2012-09-25 NOTE — Telephone Encounter (Signed)
Please advise 

## 2012-10-06 ENCOUNTER — Encounter: Payer: Self-pay | Admitting: Family Medicine

## 2012-10-06 ENCOUNTER — Ambulatory Visit (INDEPENDENT_AMBULATORY_CARE_PROVIDER_SITE_OTHER): Payer: Medicare Other | Admitting: Family Medicine

## 2012-10-06 VITALS — BP 177/81 | HR 53 | Temp 97.1°F | Ht 63.0 in | Wt 142.0 lb

## 2012-10-06 DIAGNOSIS — I10 Essential (primary) hypertension: Secondary | ICD-10-CM

## 2012-10-06 LAB — RENAL FUNCTION PANEL
Albumin: 4.4 g/dL (ref 3.5–5.2)
CO2: 26 mEq/L (ref 19–32)
Glucose, Bld: 91 mg/dL (ref 70–99)
Sodium: 138 mEq/L (ref 135–145)

## 2012-10-06 NOTE — Patient Instructions (Addendum)

## 2012-10-08 ENCOUNTER — Encounter: Payer: Self-pay | Admitting: Family Medicine

## 2012-10-08 NOTE — Assessment & Plan Note (Signed)
Improved on current meds not to goal but well enough to proceed with surgery. His renal function panel is normal today  So if hi blood pressure increases again we will increase his losartan further

## 2012-10-08 NOTE — Progress Notes (Signed)
Patient ID: Steven Morrison, male   DOB: 01/09/1928, 79 y.o.   MRN: [id number] GOVIND FUREY [id number] 08/17/1927 10/08/2012      Progress Note-Follow Up  Subjective  Chief Complaint  Chief Complaint  Patient presents with  . Follow-up    HPI   patient is an 79 year old male who is in today for blood pressure check. He is hoping to proceed with some dental workeand has finally agreed to start taking blood pressure medications as prescribed he denies any trouble with the new blood pressure medication. No GI upset. No chest pain, palpitations, shortness of breath, increased fatigue, GI or GU concerns noted  Past Medical History  Diagnosis Date  . HTN (hypertension) 09/12/2011  . Urinary urgency 10/12/2011  . Elevated PSA 11/03/2011  . Thrombocytopenia 11/17/2011    slight  . Rash, skin 01/02/2012    Left lower leg  . Constipation 02/10/2012  . Intertrigo 03/20/2012  . Urinary hesitancy 10/12/2011  . Dermatitis 09/13/2012    No past surgical history on file.  Family History  Problem Relation Age of Onset  . Migraines Daughter   . Allergies Daughter   . Allergies Son     History   Social History  . Marital Status: Divorced    Spouse Name: N/A    Number of Children: N/A  . Years of Education: N/A   Occupational History  . Not on file.   Social History Main Topics  . Smoking status: Never Smoker   . Smokeless tobacco: Never Used  . Alcohol Use: Yes     Comment: occasionally  . Drug Use: No  . Sexually Active: No   Other Topics Concern  . Not on file   Social History Narrative  . No narrative on file    Current Outpatient Prescriptions on File Prior to Visit  Medication Sig Dispense Refill  . bifidobacterium infantis (ALIGN) capsule Take 1 capsule by mouth daily.  30 capsule    . Coenzyme Q10 (CO Q 10) 100 MG CAPS Take 1 capsule by mouth daily.      . diazepam (VALIUM) 5 MG tablet 1-2 tabs po daily prn anxiety  20 tablet  0  . hydrochlorothiazide  (MICROZIDE) 12.5 MG capsule Take 1 capsule (12.5 mg total) by mouth every morning. For bp Both in the morning  30 capsule  3  . losartan (COZAAR) 50 MG tablet Take 1 tablet (50 mg total) by mouth daily.  30 tablet  3  . nebivolol (BYSTOLIC) 10 MG tablet Take 0.5 tablets (5 mg total) by mouth 2 (two) times daily. For bp  28 tablet    . Tamsulosin HCl (FLOMAX) 0.4 MG CAPS Take 1 capsule (0.4 mg total) by mouth daily after supper. For prostate and bladder  30 capsule  3  . vitamin C (ASCORBIC ACID) 500 MG tablet Take 1,000 mg by mouth daily.       No current facility-administered medications on file prior to visit.    Allergies  Allergen Reactions  . Bactrim (Sulfamethoxazole W-Trimethoprim) Rash    Review of Systems  Review of Systems  Constitutional: Negative for fever and malaise/fatigue.  HENT: Negative for congestion.   Eyes: Negative for discharge.  Respiratory: Negative for shortness of breath.   Cardiovascular: Negative for chest pain, palpitations and leg swelling.  Gastrointestinal: Negative for nausea, abdominal pain and diarrhea.  Genitourinary: Negative for dysuria.  Musculoskeletal: Negative for falls.  Skin: Negative for rash.  Neurological: Negative for loss of consciousness and  headaches.  Endo/Heme/Allergies: Negative for polydipsia.  Psychiatric/Behavioral: Negative for depression and suicidal ideas. The patient is not nervous/anxious and does not have insomnia.      Objective  BP 177/81  Pulse 53  Temp(Src) 97.1 F (36.2 C) (Oral)  Ht 5\' 3"  (1.6 m)  Wt 142 lb (64.411 kg)  BMI 25.16 kg/m2  SpO2 98%  Physical Exam  Physical Exam  Constitutional: He is oriented to person, place, and time and well-developed, well-nourished, and in no distress. No distress.  HENT:  Head: Normocephalic and atraumatic.  Eyes: Conjunctivae are normal.  Neck: Neck supple. No thyromegaly present.  Cardiovascular: Normal rate, regular rhythm and normal heart sounds.   No  murmur heard. Pulmonary/Chest: Effort normal and breath sounds normal. No respiratory distress.  Abdominal: He exhibits no distension and no mass. There is no tenderness.  Musculoskeletal: He exhibits no edema.  Neurological: He is alert and oriented to person, place, and time.  Skin: Skin is warm.  Psychiatric: Memory, affect and judgment normal.    Lab Results  Component Value Date   TSH 2.57 02/03/2012   Lab Results  Component Value Date   WBC 5.4 02/10/2012   HGB 14.6 02/10/2012   HCT 45.6 02/10/2012   MCV 85.7 02/10/2012   PLT 139.0* 02/10/2012   Lab Results  Component Value Date   CREATININE 1.13 10/06/2012   BUN 12 10/06/2012   NA 138 10/06/2012   K 4.2 10/06/2012   CL 103 10/06/2012   CO2 26 10/06/2012   Lab Results  Component Value Date   ALT 26 02/03/2012   AST 28 02/03/2012   ALKPHOS 48 02/03/2012   BILITOT 0.9 02/03/2012   Lab Results  Component Value Date   CHOL 194 10/12/2011   Lab Results  Component Value Date   HDL 46.70 10/12/2011   Lab Results  Component Value Date   LDLCALC 118* 10/12/2011   Lab Results  Component Value Date   TRIG 147.0 10/12/2011   Lab Results  Component Value Date   CHOLHDL 4 10/12/2011     Assessment & Plan  HTN (hypertension) Improved on current meds not to goal but well enough to proceed with surgery. His renal function panel is normal today  So if hi blood pressure increases again we will increase his losartan further

## 2012-10-13 ENCOUNTER — Telehealth: Payer: Self-pay | Admitting: Family Medicine

## 2012-10-13 NOTE — Telephone Encounter (Signed)
Please advise 

## 2012-10-14 NOTE — Telephone Encounter (Signed)
OK to send letter stating bp shows improved control He is medically cleared to proceed with dental work.

## 2012-10-16 NOTE — Telephone Encounter (Signed)
Letter faxed.

## 2012-10-31 ENCOUNTER — Telehealth: Payer: Self-pay | Admitting: Family Medicine

## 2012-10-31 NOTE — Telephone Encounter (Signed)
Patient Information:  Caller Name: Jeff  Phone: [phone]  Patient: Steven Morrison, Steven Morrison  Gender: Male  DOB: 1928-05-25  Age: 79 Years  PCP: Danise [facility])  Office Follow Up:  Does the office need to follow up with this patient?: No  Instructions For The Office: N/A  RN Note:  Advised pt to call back if SOB, chest pain, or rash become hives or worsens.   Symptoms  Reason For Call & Symptoms: Patient states taking medication and now have boils on the body.  Reviewed Health History In EMR: Yes  Reviewed Medications In EMR: Yes  Reviewed Allergies In EMR: Yes  Reviewed Surgeries / Procedures: Yes  Date of Onset of Symptoms: 10/10/2012  Guideline(s) Used:  Rash - Widespread on Drugs - Drug Reaction  Disposition Per Guideline:   See Today in Office  Reason For Disposition Reached:   Patient wants to be seen  Advice Given:  N/A  Patient Will Follow Care Advice:  YES  Appointment Scheduled:  11/01/2012 11:00:00 Appointment Scheduled Provider:  Earley Favor (Family Practice)  Appt not available for today and pt wanted tomorrow.

## 2012-11-01 ENCOUNTER — Encounter: Payer: Self-pay | Admitting: Family Medicine

## 2012-11-01 ENCOUNTER — Ambulatory Visit (INDEPENDENT_AMBULATORY_CARE_PROVIDER_SITE_OTHER): Payer: Medicare Other | Admitting: Family Medicine

## 2012-11-01 VITALS — BP 203/75 | HR 60 | Temp 97.8°F | Resp 12 | Wt 140.5 lb

## 2012-11-01 DIAGNOSIS — I10 Essential (primary) hypertension: Secondary | ICD-10-CM | POA: Diagnosis not present

## 2012-11-01 DIAGNOSIS — L259 Unspecified contact dermatitis, unspecified cause: Secondary | ICD-10-CM | POA: Diagnosis not present

## 2012-11-01 DIAGNOSIS — R21 Rash and other nonspecific skin eruption: Secondary | ICD-10-CM | POA: Diagnosis not present

## 2012-11-01 MED ORDER — PREDNISONE 10 MG PO TABS: ORAL_TABLET | ORAL | Status: DC

## 2012-11-01 NOTE — Progress Notes (Signed)
OFFICE NOTE  11/01/2012  CC:  Chief Complaint  Patient presents with  . Rash    Boils on back. They are itchy and uncomfortable.     HPI: Patient is a 79 y.o. African-American male who is here for rash.  Onset about 2-4 wks ago, itchy and spreading.  Difficult historian, sounds like this could have been present on and off for months. NO known trigger but pt suspicious of his bp meds.  Lotion with alo vera applied and helped nothing.  The rash gets very warm.  He denies having any rash like this before.  He stopped his bp meds 2 wks ago b/c he thought maybe the rash was related.  Rash doesn't seem to have abated much over this last 2 wks off of bp meds.  ROS: no HA, no dizziness, no CP, no SOB, no focal weakness.  No oral lesions, no new joint complaints.   Pertinent PMH:  Past Medical History  Diagnosis Date  . HTN (hypertension) 09/12/2011  . Urinary urgency 10/12/2011  . Elevated PSA 11/03/2011  . Thrombocytopenia 11/17/2011    slight  . Rash, skin 01/02/2012    Left lower leg  . Constipation 02/10/2012  . Intertrigo 03/20/2012  . Urinary hesitancy 10/12/2011  . Dermatitis 09/13/2012   History reviewed. No pertinent past surgical history.  Social hx: he is divorced, lives/sleeps alone.  No tobacco/alc/drugs.  MEDS:  Outpatient Prescriptions Prior to Visit  Medication Sig Dispense Refill  . bifidobacterium infantis (ALIGN) capsule Take 1 capsule by mouth daily.  30 capsule    . Coenzyme Q10 (CO Q 10) 100 MG CAPS Take 1 capsule by mouth daily.      . diazepam (VALIUM) 5 MG tablet 1-2 tabs po daily prn anxiety  20 tablet  0  . hydrochlorothiazide (MICROZIDE) 12.5 MG capsule Take 1 capsule (12.5 mg total) by mouth every morning. For bp Both in the morning  30 capsule  3  . losartan (COZAAR) 50 MG tablet Take 1 tablet (50 mg total) by mouth daily.  30 tablet  3  . nebivolol (BYSTOLIC) 10 MG tablet Take 0.5 tablets (5 mg total) by mouth 2 (two) times daily. For bp  28 tablet    .  Tamsulosin HCl (FLOMAX) 0.4 MG CAPS Take 1 capsule (0.4 mg total) by mouth daily after supper. For prostate and bladder  30 capsule  3  . vitamin C (ASCORBIC ACID) 500 MG tablet Take 1,000 mg by mouth daily.       No facility-administered medications prior to visit.   Allergies: Bactrim (rash)  PE: Blood pressure 203/75, pulse 60, temperature 97.8 F (36.6 C), temperature source Temporal, resp. rate 12, weight 140 lb 8 oz (63.73 kg), SpO2 100.00%. Gen: Alert, well appearing.  Patient is oriented to person, place, time, and situation. ENT:   Eyes: no injection, icteris, swelling, or exudate.  EOMI, PERRLA. Nose: no drainage or turbinate edema/swelling.  No injection or focal lesion.  Mouth: lips without lesion/swelling.  Oral mucosa pink and moist.   Oropharynx without erythema, exudate, or swelling.  Neck - No masses or thyromegaly or limitation in range of motion CV: RRR LUNGS: CTA bilat EXT: no clubbing, cyanosis, or edema.  SKIN: there is a diffuse, hyperpigmented papular rash spread across his entire back.  Similar appearing rash on anterior tibial surfaces (mid tibia level down into ankles).  No vesicles, no pustules, no petechiae or ecchymoses.  No hives, no target lesions.  Skin mildly sensitive to  palpation over the rash areas.    PROCEDURE:  Punch biopsy of rash (left upper back region)--Betadine for cleansing and 2% Lidocaine with epinephrine for anesthetic, with sterile technique a 4 mm punch biopsy was used to obtain a biopsy specimen of the lesion. Hemostasis was obtained by pressure and wound was sutured with one 4-0 ethilon suture.  Wound care instructions provided. Be alert for any signs of cutaneous infection. The specimen is labeled and sent to pathology for evaluation. The procedure was well tolerated without complications.  IMPRESSION AND PLAN:  Rash and nonspecific skin eruption Possibly related to his HCTZ, as it seemed to get worse with increase in this med from 12.5 to  25mg  qd. He does quit ALL bp meds at times, says the rash improves, then with restart of all it returns. Punch biopsy of rash sent to path today. Prednisone 20mg  qd x 5d, 10mg  qd x 5d, and 5 mg qd x 6d was rx'd today.  Uncontrolled hypertension Long history of noncompliance with meds for various reasons. I recommended he restart all of his bp meds at previous regular doses today. We can ween then away one at a time (starting with HCTZ) if skin bx supports dx of drug eruption.   An After Visit Summary was printed and given to the patient.  FOLLOW UP: 1 wk

## 2012-11-01 NOTE — Patient Instructions (Signed)
Please restart your losartan, hydrochlorothiazide, and bystolic today.

## 2012-11-02 NOTE — Assessment & Plan Note (Signed)
Possibly related to his HCTZ, as it seemed to get worse with increase in this med from 12.5 to 25mg  qd. He does quit ALL bp meds at times, says the rash improves, then with restart of all it returns. Punch biopsy of rash sent to path today. Prednisone 20mg  qd x 5d, 10mg  qd x 5d, and 5 mg qd x 6d was rx'd today.

## 2012-11-02 NOTE — Assessment & Plan Note (Signed)
Long history of noncompliance with meds for various reasons. I recommended he restart all of his bp meds at previous regular doses today. We can ween then away one at a time (starting with HCTZ) if skin bx supports dx of drug eruption.

## 2012-11-08 ENCOUNTER — Encounter: Payer: Self-pay | Admitting: Family Medicine

## 2012-11-08 ENCOUNTER — Ambulatory Visit (INDEPENDENT_AMBULATORY_CARE_PROVIDER_SITE_OTHER): Payer: Medicare Other | Admitting: Family Medicine

## 2012-11-08 VITALS — BP 182/79 | HR 54 | Temp 97.1°F | Resp 14 | Wt 137.0 lb

## 2012-11-08 DIAGNOSIS — R21 Rash and other nonspecific skin eruption: Secondary | ICD-10-CM | POA: Diagnosis not present

## 2012-11-08 DIAGNOSIS — I10 Essential (primary) hypertension: Secondary | ICD-10-CM | POA: Diagnosis not present

## 2012-11-08 DIAGNOSIS — R3915 Urgency of urination: Secondary | ICD-10-CM | POA: Diagnosis not present

## 2012-11-08 DIAGNOSIS — B351 Tinea unguium: Secondary | ICD-10-CM

## 2012-11-08 MED ORDER — TAMSULOSIN HCL 0.4 MG PO CAPS: 0.4000 mg | ORAL_CAPSULE | Freq: Every day | ORAL | Status: DC

## 2012-11-08 MED ORDER — LOSARTAN POTASSIUM 100 MG PO TABS: 100.0000 mg | ORAL_TABLET | Freq: Every day | ORAL | Status: DC

## 2012-11-08 MED ORDER — TERBINAFINE HCL 250 MG PO TABS: 250.0000 mg | ORAL_TABLET | Freq: Every day | ORAL | Status: DC

## 2012-11-08 NOTE — Patient Instructions (Addendum)
STOP prednisone. If you develop any persistent pain in your right upper abdomen or notice any jaundice color to your eyes or skin then stop your terbinafine and call/return to office ASAP.

## 2012-11-08 NOTE — Assessment & Plan Note (Signed)
Terbinafine 250mg  po qd x 12 wks. He has had hepatic panel as recently as summer 2013 so we'll use this as his baseline in case we need to recheck hepatic panel while on terbinafine.

## 2012-11-08 NOTE — Assessment & Plan Note (Signed)
Bx results nonspecific, but with his severe left foot onychomycosis I think I will treat this as an Id reaction. Will stop his prednisone. If treating toe fungus does not result in gradual resolution of his skin rash on back and anterior tibial surfaces, then will start peeling away meds that could possibly causing a drug eruption (hctz and/or tamsulosin).

## 2012-11-08 NOTE — Assessment & Plan Note (Signed)
Slightly improved since back on meds. Will increase his losartan to 100mg  qd.   Continue on 12.5 mg hctz, 5mg  bid bystolic.

## 2012-11-08 NOTE — Progress Notes (Signed)
OFFICE NOTE  11/08/2012  CC:  Chief Complaint  Patient presents with  . Follow-up    1 week, rash and HTN     HPI: Patient is a 79 y.o. African-American male who is here for 1 week f/u rash (skin bx was done last week) and uncontrolled hypertension.  Reviewed biopsy result with pt: nonspecific dermatitis that could be from drug, id reaction, nummular dermatitis, contact dermatitis, or urticarial bullous pemphigoid.   Pt's rash is essentially unchanged on 1 wk of prednisone. He has no home bp measurements to report but has been compliant with his meds. He denies any complaint other than the rash.  Specifically, he denies any HA, blurry vision, chest pain, shortness of breath, or dizziness.  No fatigue.  He does say he has a prominent problem with his toenails on left foot; has applied topical antifungal without success.  This has been present for at least 6 mo, maybe longer--he is not sure.  Pertinent PMH:  Past Medical History  Diagnosis Date  . HTN (hypertension) 09/12/2011  . Urinary urgency 10/12/2011  . Elevated PSA 11/03/2011  . Thrombocytopenia 11/17/2011    slight  . Rash, skin 01/02/2012    Left lower leg  . Constipation 02/10/2012  . Intertrigo 03/20/2012  . Urinary hesitancy 10/12/2011  . Dermatitis 09/13/2012    MEDS:  Outpatient Prescriptions Prior to Visit  Medication Sig Dispense Refill  . bifidobacterium infantis (ALIGN) capsule Take 1 capsule by mouth daily.  30 capsule    . Coenzyme Q10 (CO Q 10) 100 MG CAPS Take 1 capsule by mouth daily.      . diazepam (VALIUM) 5 MG tablet 1-2 tabs po daily prn anxiety  20 tablet  0  . hydrochlorothiazide (MICROZIDE) 12.5 MG capsule Take 1 capsule (12.5 mg total) by mouth every morning. For bp Both in the morning  30 capsule  3  . losartan (COZAAR) 50 MG tablet Take 1 tablet (50 mg total) by mouth daily.  30 tablet  3  . nebivolol (BYSTOLIC) 10 MG tablet Take 0.5 tablets (5 mg total) by mouth 2 (two) times daily. For bp  28 tablet     . predniSONE (DELTASONE) 10 MG tablet 2 tabs po qd x 5d, then 1 tab po qd x 5d, then 1/2 tab po qd x 6d, then stop  18 tablet  0  . Tamsulosin HCl (FLOMAX) 0.4 MG CAPS Take 1 capsule (0.4 mg total) by mouth daily after supper. For prostate and bladder  30 capsule  3  . vitamin C (ASCORBIC ACID) 500 MG tablet Take 1,000 mg by mouth daily.       No facility-administered medications prior to visit.    PE: Blood pressure 182/79, pulse 54, temperature 97.1 F (36.2 C), temperature source Temporal, resp. rate 14, weight 137 lb (62.143 kg), SpO2 98.00%. Gen: Alert, well appearing.  Patient is oriented to person, place, time, and situation. AFFECT: pleasant, lucid thought and speech. SKIN: diffuse coalesced papular rash that is violacious in hue---covering most of back and also on anterior tibial surfaces bilat.  No vesicles, no pustules, no hives, no petechiae. FEET: both feet exhibit dry/flaky texture, with mild pinkish inflammation appearance to the soles. Right foot toenails are all normal.  Left foot toenails 1-5 are extremely thickened, with dirty-white substance under each nail.  IMPRESSION AND PLAN:  Uncontrolled hypertension Slightly improved since back on meds. Will increase his losartan to 100mg  qd.   Continue on 12.5 mg hctz, 5mg  bid  bystolic.  Rash and nonspecific skin eruption Bx results nonspecific, but with his severe left foot onychomycosis I think I will treat this as an Id reaction. Will stop his prednisone. If treating toe fungus does not result in gradual resolution of his skin rash on back and anterior tibial surfaces, then will start peeling away meds that could possibly causing a drug eruption (hctz and/or tamsulosin).   Onychomycosis of toenail Terbinafine 250mg  po qd x 12 wks. He has had hepatic panel as recently as summer 2013 so we'll use this as his baseline in case we need to recheck hepatic panel while on terbinafine.   An After Visit Summary was printed  and given to the patient.  FOLLOW UP:  wks

## 2012-11-22 ENCOUNTER — Ambulatory Visit (INDEPENDENT_AMBULATORY_CARE_PROVIDER_SITE_OTHER): Payer: Medicare Other | Admitting: Family Medicine

## 2012-11-22 ENCOUNTER — Encounter: Payer: Self-pay | Admitting: Family Medicine

## 2012-11-22 VITALS — BP 146/73 | HR 51 | Temp 97.4°F | Ht 62.5 in | Wt 138.8 lb

## 2012-11-22 DIAGNOSIS — H6122 Impacted cerumen, left ear: Secondary | ICD-10-CM

## 2012-11-22 DIAGNOSIS — L309 Dermatitis, unspecified: Secondary | ICD-10-CM

## 2012-11-22 DIAGNOSIS — H6120 Impacted cerumen, unspecified ear: Secondary | ICD-10-CM | POA: Diagnosis not present

## 2012-11-22 DIAGNOSIS — L259 Unspecified contact dermatitis, unspecified cause: Secondary | ICD-10-CM | POA: Diagnosis not present

## 2012-11-22 DIAGNOSIS — I10 Essential (primary) hypertension: Secondary | ICD-10-CM | POA: Diagnosis not present

## 2012-11-22 MED ORDER — NEOMYCIN-POLYMYXIN-HC 3.5-10000-1 OT SOLN: 3.0000 [drp] | Freq: Every evening | OTIC | Status: DC | PRN

## 2012-11-22 MED ORDER — CLOBETASOL PROPIONATE 0.05 % EX LOTN: TOPICAL_LOTION | CUTANEOUS | Status: DC

## 2012-11-22 NOTE — Assessment & Plan Note (Signed)
Bad on his back and very pruritic, will d/c HCTZ and given a steroid lotion to apply, try Benadryl qhs and report if no improvement

## 2012-11-22 NOTE — Patient Instructions (Addendum)
Benadryl/Diphenhydramine 25 to 50 mg at bed time for sleep or itching  Contact Dermatitis Contact dermatitis is a reaction to certain substances that touch the skin. Contact dermatitis can be either irritant contact dermatitis or allergic contact dermatitis. Irritant contact dermatitis does not require previous exposure to the substance for a reaction to occur.Allergic contact dermatitis only occurs if you have been exposed to the substance before. Upon a repeat exposure, your body reacts to the substance.  CAUSES  Many substances can cause contact dermatitis. Irritant dermatitis is most commonly caused by repeated exposure to mildly irritating substances, such as:  Makeup.  Soaps.  Detergents.  Bleaches.  Acids.  Metal salts, such as nickel. Allergic contact dermatitis is most commonly caused by exposure to:  Poisonous plants.  Chemicals (deodorants, shampoos).  Jewelry.  Latex.  Neomycin in triple antibiotic cream.  Preservatives in products, including clothing. SYMPTOMS  The area of skin that is exposed may develop:  Dryness or flaking.  Redness.  Cracks.  Itching.  Pain or a burning sensation.  Blisters. With allergic contact dermatitis, there may also be swelling in areas such as the eyelids, mouth, or genitals.  DIAGNOSIS  Your caregiver can usually tell what the problem is by doing a physical exam. In cases where the cause is uncertain and an allergic contact dermatitis is suspected, a patch skin test may be performed to help determine the cause of your dermatitis. TREATMENT Treatment includes protecting the skin from further contact with the irritating substance by avoiding that substance if possible. Barrier creams, powders, and gloves may be helpful. Your caregiver may also recommend:  Steroid creams or ointments applied 2 times daily. For best results, soak the rash area in cool water for 20 minutes. Then apply the medicine. Cover the area with a plastic  wrap. You can store the steroid cream in the refrigerator for a "chilly" effect on your rash. That may decrease itching. Oral steroid medicines may be needed in more severe cases.  Antibiotics or antibacterial ointments if a skin infection is present.  Antihistamine lotion or an antihistamine taken by mouth to ease itching.  Lubricants to keep moisture in your skin.  Burow's solution to reduce redness and soreness or to dry a weeping rash. Mix one packet or tablet of solution in 2 cups cool water. Dip a clean washcloth in the mixture, wring it out a bit, and put it on the affected area. Leave the cloth in place for 30 minutes. Do this as often as possible throughout the day.  Taking several cornstarch or baking soda baths daily if the area is too large to cover with a washcloth. Harsh chemicals, such as alkalis or acids, can cause skin damage that is like a burn. You should flush your skin for 15 to 20 minutes with cold water after such an exposure. You should also seek immediate medical care after exposure. Bandages (dressings), antibiotics, and pain medicine may be needed for severely irritated skin.  HOME CARE INSTRUCTIONS  Avoid the substance that caused your reaction.  Keep the area of skin that is affected away from hot water, soap, sunlight, chemicals, acidic substances, or anything else that would irritate your skin.  Do not scratch the rash. Scratching may cause the rash to become infected.  You may take cool baths to help stop the itching.  Only take over-the-counter or prescription medicines as directed by your caregiver.  See your caregiver for follow-up care as directed to make sure your skin is healing properly.  SEEK MEDICAL CARE IF:   Your condition is not better after 3 days of treatment.  You seem to be getting worse.  You see signs of infection such as swelling, tenderness, redness, soreness, or warmth in the affected area.  You have any problems related to your  medicines. Document Released: 07/30/2000 Document Revised: 10/25/2011 Document Reviewed: 01/05/2011 [facility] Patient Information 2013 [place], [place].

## 2012-11-22 NOTE — Progress Notes (Signed)
Patient ID: Steven Morrison, male   DOB: 04-07-1928, 79 y.o.   MRN: [id number] Steven Morrison [id number] 17-Oct-1927 11/22/2012      Progress Note-Follow Up  Subjective  Chief Complaint  Chief Complaint  Patient presents with  . Follow-up    2 week    HPI  Patient is an 80 year old male who is in today for blood pressure followup. His blood pressure is well controlled and he is taking his medications regularly. Unfortunately his rash has worsened again and is very bad on his back at the moment with significant itching and making it difficult to sleep. No f/c/HA/cp/palp/sob/gi or gu concerns noted  Past Medical History  Diagnosis Date  . HTN (hypertension) 09/12/2011  . Urinary urgency 10/12/2011  . Elevated PSA 11/03/2011  . Thrombocytopenia 11/17/2011    slight  . Rash, skin 01/02/2012    Left lower leg  . Constipation 02/10/2012  . Intertrigo 03/20/2012  . Urinary hesitancy 10/12/2011  . Dermatitis 09/13/2012    History reviewed. No pertinent past surgical history.  Family History  Problem Relation Age of Onset  . Migraines Daughter   . Allergies Daughter   . Allergies Son     History   Social History  . Marital Status: Divorced    Spouse Name: N/A    Number of Children: N/A  . Years of Education: N/A   Occupational History  . Not on file.   Social History Main Topics  . Smoking status: Never Smoker   . Smokeless tobacco: Never Used  . Alcohol Use: Yes     Comment: occasionally  . Drug Use: No  . Sexually Active: No   Other Topics Concern  . Not on file   Social History Narrative  . No narrative on file    Current Outpatient Prescriptions on File Prior to Visit  Medication Sig Dispense Refill  . bifidobacterium infantis (ALIGN) capsule Take 1 capsule by mouth daily.  30 capsule    . Coenzyme Q10 (CO Q 10) 100 MG CAPS Take 1 capsule by mouth daily.      . diazepam (VALIUM) 5 MG tablet 1-2 tabs po daily prn anxiety  20 tablet  0  . losartan  (COZAAR) 100 MG tablet Take 1 tablet (100 mg total) by mouth daily.  30 tablet  6  . nebivolol (BYSTOLIC) 10 MG tablet Take 0.5 tablets (5 mg total) by mouth 2 (two) times daily. For bp  28 tablet    . tamsulosin (FLOMAX) 0.4 MG CAPS Take 1 capsule (0.4 mg total) by mouth daily after supper. For prostate and bladder  30 capsule  6  . terbinafine (LAMISIL) 250 MG tablet Take 1 tablet (250 mg total) by mouth daily.  30 tablet  2  . vitamin C (ASCORBIC ACID) 500 MG tablet Take 1,000 mg by mouth daily.       No current facility-administered medications on file prior to visit.    Allergies  Allergen Reactions  . Hctz (Hydrochlorothiazide) Rash  . Bactrim (Sulfamethoxazole W-Trimethoprim) Rash    Review of Systems Review of Systems  Constitutional: Negative for fever and malaise/fatigue.  HENT: Negative for congestion.   Eyes: Negative for discharge.  Respiratory: Negative for shortness of breath.   Cardiovascular: Negative for chest pain, palpitations and leg swelling.  Gastrointestinal: Negative for nausea, abdominal pain and diarrhea.  Genitourinary: Negative for dysuria.  Musculoskeletal: Negative for falls.  Skin: Positive for itching and rash.  Neurological: Negative for loss of consciousness and  headaches.  Endo/Heme/Allergies: Negative for polydipsia.  Psychiatric/Behavioral: Negative for depression and suicidal ideas. The patient is not nervous/anxious and does not have insomnia.     Objective  BP 146/73  Pulse 51  Temp(Src) 97.4 F (36.3 C) (Temporal)  Ht 5' 2.5" (1.588 m)  Wt 138 lb 12.8 oz (62.959 kg)  BMI 24.97 kg/m2  SpO2 100%  Physical Exam  Physical Exam  Constitutional: He is oriented to person, place, and time and well-developed, well-nourished, and in no distress. No distress.  HENT:  Head: Normocephalic and atraumatic.  Eyes: Conjunctivae are normal.  Neck: Neck supple. No thyromegaly present.  Cardiovascular: Normal rate, regular rhythm and normal  heart sounds.   No murmur heard. Pulmonary/Chest: Effort normal and breath sounds normal. No respiratory distress.  Abdominal: He exhibits no distension and no mass. There is no tenderness.  Musculoskeletal: He exhibits no edema.  Neurological: He is alert and oriented to person, place, and time.  Skin: Skin is warm. Rash noted. There is erythema.  Raised, edematous, erythematous rash across back  Psychiatric: Memory, affect and judgment normal.    Lab Results  Component Value Date   TSH 2.57 02/03/2012   Lab Results  Component Value Date   WBC 5.4 02/10/2012   HGB 14.6 02/10/2012   HCT 45.6 02/10/2012   MCV 85.7 02/10/2012   PLT 139.0* 02/10/2012   Lab Results  Component Value Date   CREATININE 1.13 10/06/2012   BUN 12 10/06/2012   NA 138 10/06/2012   K 4.2 10/06/2012   CL 103 10/06/2012   CO2 26 10/06/2012   Lab Results  Component Value Date   ALT 26 02/03/2012   AST 28 02/03/2012   ALKPHOS 48 02/03/2012   BILITOT 0.9 02/03/2012   Lab Results  Component Value Date   CHOL 194 10/12/2011   Lab Results  Component Value Date   HDL 46.70 10/12/2011   Lab Results  Component Value Date   LDLCALC 118* 10/12/2011   Lab Results  Component Value Date   TRIG 147.0 10/12/2011   Lab Results  Component Value Date   CHOLHDL 4 10/12/2011     Assessment & Plan  Dermatitis Bad on his back and very pruritic, will d/c HCTZ and given a steroid lotion to apply, try Benadryl qhs and report if no improvement  HTN (hypertension) bp improved, d/c hctz due to rash

## 2012-11-22 NOTE — Assessment & Plan Note (Signed)
bp improved, d/c hctz due to rash

## 2012-11-29 ENCOUNTER — Ambulatory Visit (INDEPENDENT_AMBULATORY_CARE_PROVIDER_SITE_OTHER): Payer: Medicare Other | Admitting: *Deleted

## 2012-11-29 VITALS — BP 140/76 | HR 48

## 2012-11-29 DIAGNOSIS — H6120 Impacted cerumen, unspecified ear: Secondary | ICD-10-CM | POA: Diagnosis not present

## 2012-11-29 DIAGNOSIS — I10 Essential (primary) hypertension: Secondary | ICD-10-CM

## 2012-11-29 DIAGNOSIS — H6123 Impacted cerumen, bilateral: Secondary | ICD-10-CM

## 2012-11-29 NOTE — Progress Notes (Signed)
Procedure :  Ear irrigation  Indication:  Cerumen impaction  Risks, including pain, dizziness, eardrum perforation, bleeding, infection and others as well as benefits were explained to the patient in detail. Verbal consent was obtained and the patient agreed to proceed.   We used "The Elephant Ear Irrigation Device" filled with lukewarm water for irrigation. A large amount of wax was recovered from the left ear and a small amount recovered from the right ear.   Tolerated well. Complications: None.  Postprocedure instructions :  Call if problems.  Pt also had BP check today.  BP was 140/76, pulse 48.  Dr. Abner Greenspan notified of BP results and checked pt's ears.  Rash continues to improve so pt is to remain off HCTZ.  Advised pt to use ear drops given at last visit in each ear before bed x 1 week.  Pt agreeable.

## 2013-01-22 ENCOUNTER — Encounter: Payer: Self-pay | Admitting: Family Medicine

## 2013-01-22 ENCOUNTER — Ambulatory Visit (INDEPENDENT_AMBULATORY_CARE_PROVIDER_SITE_OTHER): Payer: Medicare Other | Admitting: Family Medicine

## 2013-01-22 VITALS — BP 204/100 | HR 79 | Temp 98.5°F | Ht 62.5 in | Wt 140.0 lb

## 2013-01-22 DIAGNOSIS — I10 Essential (primary) hypertension: Secondary | ICD-10-CM

## 2013-01-22 DIAGNOSIS — L309 Dermatitis, unspecified: Secondary | ICD-10-CM

## 2013-01-22 DIAGNOSIS — L259 Unspecified contact dermatitis, unspecified cause: Secondary | ICD-10-CM | POA: Diagnosis not present

## 2013-01-22 LAB — CBC
HCT: 39.9 % (ref 39.0–52.0)
Hemoglobin: 13.5 g/dL (ref 13.0–17.0)
MCHC: 33.8 g/dL (ref 30.0–36.0)
MCV: 79.5 fL (ref 78.0–100.0)
RDW: 16.5 % — ABNORMAL HIGH (ref 11.5–15.5)

## 2013-01-22 LAB — HEPATIC FUNCTION PANEL
ALT: 20 U/L (ref 0–53)
AST: 21 U/L (ref 0–37)
Bilirubin, Direct: 0.1 mg/dL (ref 0.0–0.3)
Total Protein: 7.1 g/dL (ref 6.0–8.3)

## 2013-01-22 LAB — RENAL FUNCTION PANEL
BUN: 10 mg/dL (ref 6–23)
CO2: 26 mEq/L (ref 19–32)
Chloride: 104 mEq/L (ref 96–112)
Creat: 0.98 mg/dL (ref 0.50–1.35)
Glucose, Bld: 140 mg/dL — ABNORMAL HIGH (ref 70–99)
Phosphorus: 2.5 mg/dL (ref 2.3–4.6)
Potassium: 4.1 mEq/L (ref 3.5–5.3)

## 2013-01-22 MED ORDER — NEBIVOLOL HCL 10 MG PO TABS: 5.0000 mg | ORAL_TABLET | Freq: Every day | ORAL | Status: DC

## 2013-01-22 NOTE — Assessment & Plan Note (Addendum)
Stopped his Bystolic a couple days ago and unfortunately his numbers are up. He says he actually feels better and offers no c/o. Agrees to go back on just 5 mg of Bystolic daily and continue Losartan

## 2013-01-22 NOTE — Patient Instructions (Signed)

## 2013-01-23 ENCOUNTER — Encounter: Payer: Self-pay | Admitting: Family Medicine

## 2013-01-23 NOTE — Progress Notes (Signed)
Patient ID: Steven Morrison, male   DOB: Apr 01, 1928, 79 y.o.   MRN: [id number] Steven Morrison [id number] [birth year] 01/23/2013      Progress Note-Follow Up  Subjective  Chief Complaint  Chief Complaint  Patient presents with  . Follow-up    2 month    HPI  Patient is an 79 year old American male who is in today for followup. Unfortunately he ran out of his systolic a couple weeks ago and stopped it. He reports he feels well. He says he feels better with less fatigue off of the medication. He denies any headaches or chest pain. No shortness of breath or malaise. Says he feels well he had no rash and no fever or concerns. Does continue to take a slow start in his Flomax daily. Complaints of difficulty sleeping. Says he falls asleep okay  Past Medical History  Diagnosis Date  . HTN (hypertension) 09/12/2011  . Urinary urgency 10/12/2011  . Elevated PSA 11/03/2011  . Thrombocytopenia 11/17/2011    slight  . Rash, skin 01/02/2012    Left lower leg  . Constipation 02/10/2012  . Intertrigo 03/20/2012  . Urinary hesitancy 10/12/2011  . Dermatitis 09/13/2012    History reviewed. No pertinent past surgical history.  Family History  Problem Relation Age of Onset  . Migraines Daughter   . Allergies Daughter   . Allergies Son     History   Social History  . Marital Status: Divorced    Spouse Name: N/A    Number of Children: N/A  . Years of Education: N/A   Occupational History  . Not on file.   Social History Main Topics  . Smoking status: Never Smoker   . Smokeless tobacco: Never Used  . Alcohol Use: Yes     Comment: occasionally  . Drug Use: No  . Sexually Active: No   Other Topics Concern  . Not on file   Social History Narrative  . No narrative on file    Current Outpatient Prescriptions on File Prior to Visit  Medication Sig Dispense Refill  . losartan (COZAAR) 100 MG tablet Take 1 tablet (100 mg total) by mouth daily.  30 tablet  6  . tamsulosin (FLOMAX)  0.4 MG CAPS Take 1 capsule (0.4 mg total) by mouth daily after supper. For prostate and bladder  30 capsule  6   No current facility-administered medications on file prior to visit.    Allergies  Allergen Reactions  . Hctz (Hydrochlorothiazide) Rash  . Bactrim (Sulfamethoxazole W-Trimethoprim) Rash    Review of Systems  Review of Systems  Constitutional: Negative for fever and malaise/fatigue.  HENT: Negative for congestion.   Eyes: Negative for discharge.  Respiratory: Negative for shortness of breath.   Cardiovascular: Negative for chest pain, palpitations and leg swelling.  Gastrointestinal: Negative for nausea, abdominal pain and diarrhea.  Genitourinary: Negative for dysuria.  Musculoskeletal: Negative for falls.  Skin: Negative for rash.  Neurological: Negative for loss of consciousness and headaches.  Endo/Heme/Allergies: Negative for polydipsia.  Psychiatric/Behavioral: Negative for depression and suicidal ideas. The patient is not nervous/anxious and does not have insomnia.     Objective  BP 220/100  Pulse 79  Temp(Src) 98.5 F (36.9 C) (Oral)  Ht 5' 2.5" (1.588 m)  Wt 140 lb 0.6 oz (63.522 kg)  BMI 25.19 kg/m2  SpO2 98%  Physical Exam  Physical Exam  Constitutional: He is oriented to person, place, and time and well-developed, well-nourished, and in no distress. No distress.  HENT:  Head: Normocephalic and atraumatic.  Eyes: Conjunctivae are normal.  Neck: Neck supple. No thyromegaly present.  Cardiovascular: Normal rate, regular rhythm and normal heart sounds.   Pulmonary/Chest: Effort normal and breath sounds normal. No respiratory distress.  Abdominal: He exhibits no distension and no mass. There is no tenderness.  Musculoskeletal: He exhibits no edema.  Neurological: He is alert and oriented to person, place, and time.  Skin: Skin is warm.  Psychiatric: Memory, affect and judgment normal.    Lab Results  Component Value Date   TSH 2.494 01/22/2013    Lab Results  Component Value Date   WBC 4.9 01/22/2013   HGB 13.5 01/22/2013   HCT 39.9 01/22/2013   MCV 79.5 01/22/2013   PLT 150 01/22/2013   Lab Results  Component Value Date   CREATININE 0.98 01/22/2013   BUN 10 01/22/2013   NA 139 01/22/2013   K 4.1 01/22/2013   CL 104 01/22/2013   CO2 26 01/22/2013   Lab Results  Component Value Date   ALT 20 01/22/2013   AST 21 01/22/2013   ALKPHOS 46 01/22/2013   BILITOT 0.5 01/22/2013   Lab Results  Component Value Date   CHOL 194 10/12/2011   Lab Results  Component Value Date   HDL 46.70 10/12/2011   Lab Results  Component Value Date   LDLCALC 118* 10/12/2011   Lab Results  Component Value Date   TRIG 147.0 10/12/2011   Lab Results  Component Value Date   CHOLHDL 4 10/12/2011     Assessment & Plan  Uncontrolled hypertension Stopped his Bystolic a couple days ago and unfortunately his numbers are up. He says he actually feels better and offers no c/o. Agrees to go back on just 5 mg of Bystolic daily and continue Losartan  Dermatitis Rash has not returned since stopping the HCTZ

## 2013-01-23 NOTE — Assessment & Plan Note (Signed)
Rash has not returned since stopping the HCTZ

## 2013-02-19 ENCOUNTER — Encounter: Payer: Self-pay | Admitting: Family Medicine

## 2013-02-19 ENCOUNTER — Ambulatory Visit (INDEPENDENT_AMBULATORY_CARE_PROVIDER_SITE_OTHER): Payer: Medicare Other | Admitting: Family Medicine

## 2013-02-19 VITALS — BP 168/86 | HR 67 | Temp 98.2°F | Ht 62.5 in | Wt 139.0 lb

## 2013-02-19 DIAGNOSIS — I10 Essential (primary) hypertension: Secondary | ICD-10-CM

## 2013-02-19 DIAGNOSIS — G4700 Insomnia, unspecified: Secondary | ICD-10-CM

## 2013-02-19 MED ORDER — LOSARTAN POTASSIUM 100 MG PO TABS: 100.0000 mg | ORAL_TABLET | Freq: Every day | ORAL | Status: DC

## 2013-02-19 NOTE — Progress Notes (Signed)
Patient ID: Steven Morrison, male   DOB: 07-02-1928, 79 y.o.   MRN: [id number] KHARTER BREW [id number] [birth year] 02/19/2013      Progress Note-Follow Up  Subjective  Chief Complaint  Chief Complaint  Patient presents with  . Follow-up    4 week    HPI  History-year-old African American male who is in today for followup. He is taking his blood pressure medicine as prescribed. He says he feels well. No chest pain, headaches, palpitations, shortness of. His biggest complaint is trouble falling asleep and staying asleep.  Past Medical History  Diagnosis Date  . HTN (hypertension) 09/12/2011  . Urinary urgency 10/12/2011  . Elevated PSA 11/03/2011  . Thrombocytopenia 11/17/2011    slight  . Rash, skin 01/02/2012    Left lower leg  . Constipation 02/10/2012  . Intertrigo 03/20/2012  . Urinary hesitancy 10/12/2011  . Dermatitis 09/13/2012  . Uncontrolled hypertension 09/12/2011    History reviewed. No pertinent past surgical history.  Family History  Problem Relation Age of Onset  . Migraines Daughter   . Allergies Daughter   . Allergies Son     History   Social History  . Marital Status: Divorced    Spouse Name: N/A    Number of Children: N/A  . Years of Education: N/A   Occupational History  . Not on file.   Social History Main Topics  . Smoking status: Never Smoker   . Smokeless tobacco: Never Used  . Alcohol Use: Yes     Comment: occasionally  . Drug Use: No  . Sexually Active: No   Other Topics Concern  . Not on file   Social History Narrative  . No narrative on file    Current Outpatient Prescriptions on File Prior to Visit  Medication Sig Dispense Refill  . nebivolol (BYSTOLIC) 10 MG tablet Take 0.5 tablets (5 mg total) by mouth daily.  21 tablet  0  . tamsulosin (FLOMAX) 0.4 MG CAPS Take 1 capsule (0.4 mg total) by mouth daily after supper. For prostate and bladder  30 capsule  6   No current facility-administered medications on file prior to  visit.    Allergies  Allergen Reactions  . Hctz (Hydrochlorothiazide) Rash  . Bactrim (Sulfamethoxazole W-Trimethoprim) Rash    Review of Systems  Review of Systems  Constitutional: Negative for fever and malaise/fatigue.  HENT: Negative for congestion.   Eyes: Negative for pain and discharge.  Respiratory: Negative for shortness of breath.   Cardiovascular: Negative for chest pain, palpitations and leg swelling.  Gastrointestinal: Negative for nausea, abdominal pain and diarrhea.  Genitourinary: Negative for dysuria.  Musculoskeletal: Negative for falls.  Skin: Negative for rash.  Neurological: Negative for loss of consciousness and headaches.  Endo/Heme/Allergies: Negative for polydipsia.  Psychiatric/Behavioral: Negative for depression and suicidal ideas. The patient has insomnia. The patient is not nervous/anxious.     Objective  BP 168/86  Pulse 67  Temp(Src) 98.2 F (36.8 C) (Oral)  Ht 5' 2.5" (1.588 m)  Wt 139 lb (63.05 kg)  BMI 25 kg/m2  SpO2 97%  Physical Exam  Physical Exam  Constitutional: He is oriented to person, place, and time and well-developed, well-nourished, and in no distress. No distress.  HENT:  Head: Normocephalic and atraumatic.  Eyes: Conjunctivae are normal.  Neck: Neck supple. No thyromegaly present.  Cardiovascular: Normal rate, regular rhythm and normal heart sounds.   No murmur heard. Pulmonary/Chest: Effort normal and breath sounds normal. No respiratory distress.  Abdominal:  He exhibits no distension and no mass. There is no tenderness.  Musculoskeletal: He exhibits no edema.  Neurological: He is alert and oriented to person, place, and time.  Skin: Skin is warm.  Psychiatric: Memory, affect and judgment normal.    Lab Results  Component Value Date   TSH 2.494 01/22/2013   Lab Results  Component Value Date   WBC 4.9 01/22/2013   HGB 13.5 01/22/2013   HCT 39.9 01/22/2013   MCV 79.5 01/22/2013   PLT 150 01/22/2013   Lab Results   Component Value Date   CREATININE 0.98 01/22/2013   BUN 10 01/22/2013   NA 139 01/22/2013   K 4.1 01/22/2013   CL 104 01/22/2013   CO2 26 01/22/2013   Lab Results  Component Value Date   ALT 20 01/22/2013   AST 21 01/22/2013   ALKPHOS 46 01/22/2013   BILITOT 0.5 01/22/2013   Lab Results  Component Value Date   CHOL 194 10/12/2011   Lab Results  Component Value Date   HDL 46.70 10/12/2011   Lab Results  Component Value Date   LDLCALC 118* 10/12/2011   Lab Results  Component Value Date   TRIG 147.0 10/12/2011   Lab Results  Component Value Date   CHOLHDL 4 10/12/2011     Assessment & Plan  Uncontrolled hypertension Improved control today, no change in meds today  Insomnia Naps frequently in evening, encouraged to try not napping during th evening

## 2013-02-19 NOTE — Patient Instructions (Addendum)
Try Unisom or Valerian root  Insomnia Insomnia is frequent trouble falling and/or staying asleep. Insomnia can be a long term problem or a short term problem. Both are common. Insomnia can be a short term problem when the wakefulness is related to a certain stress or worry. Long term insomnia is often related to ongoing stress during waking hours and/or poor sleeping habits. Overtime, sleep deprivation itself can make the problem worse. Every little thing feels more severe because you are overtired and your ability to cope is decreased. CAUSES   Stress, anxiety, and depression.  Poor sleeping habits.  Distractions such as TV in the bedroom.  Naps close to bedtime.  Engaging in emotionally charged conversations before bed.  Technical reading before sleep.  Alcohol and other sedatives. They may make the problem worse. They can hurt normal sleep patterns and normal dream activity.  Stimulants such as caffeine for several hours prior to bedtime.  Pain syndromes and shortness of breath can cause insomnia.  Exercise late at night.  Changing time zones may cause sleeping problems (jet lag). It is sometimes helpful to have someone observe your sleeping patterns. They should look for periods of not breathing during the night (sleep apnea). They should also look to see how long those periods last. If you live alone or observers are uncertain, you can also be observed at a sleep clinic where your sleep patterns will be professionally monitored. Sleep apnea requires a checkup and treatment. Give your caregivers your medical history. Give your caregivers observations your family has made about your sleep.  SYMPTOMS   Not feeling rested in the morning.  Anxiety and restlessness at bedtime.  Difficulty falling and staying asleep. TREATMENT   Your caregiver may prescribe treatment for an underlying medical disorders. Your caregiver can give advice or help if you are using alcohol or other drugs  for self-medication. Treatment of underlying problems will usually eliminate insomnia problems.  Medications can be prescribed for short time use. They are generally not recommended for lengthy use.  Over-the-counter sleep medicines are not recommended for lengthy use. They can be habit forming.  You can promote easier sleeping by making lifestyle changes such as:  Using relaxation techniques that help with breathing and reduce muscle tension.  Exercising earlier in the day.  Changing your diet and the time of your last meal. No night time snacks.  Establish a regular time to go to bed.  Counseling can help with stressful problems and worry.  Soothing music and white noise may be helpful if there are background noises you cannot remove.  Stop tedious detailed work at least one hour before bedtime. HOME CARE INSTRUCTIONS   Keep a diary. Inform your caregiver about your progress. This includes any medication side effects. See your caregiver regularly. Take note of:  Times when you are asleep.  Times when you are awake during the night.  The quality of your sleep.  How you feel the next day. This information will help your caregiver care for you.  Get out of bed if you are still awake after 15 minutes. Read or do some quiet activity. Keep the lights down. Wait until you feel sleepy and go back to bed.  Keep regular sleeping and waking hours. Avoid naps.  Exercise regularly.  Avoid distractions at bedtime. Distractions include watching television or engaging in any intense or detailed activity like attempting to balance the household checkbook.  Develop a bedtime ritual. Keep a familiar routine of bathing, brushing your teeth,  climbing into bed at the same time each night, listening to soothing music. Routines increase the success of falling to sleep faster.  Use relaxation techniques. This can be using breathing and muscle tension release routines. It can also include  visualizing peaceful scenes. You can also help control troubling or intruding thoughts by keeping your mind occupied with boring or repetitive thoughts like the old concept of counting sheep. You can make it more creative like imagining planting one beautiful flower after another in your backyard garden.  During your day, work to eliminate stress. When this is not possible use some of the previous suggestions to help reduce the anxiety that accompanies stressful situations. MAKE SURE YOU:   Understand these instructions.  Will watch your condition.  Will get help right away if you are not doing well or get worse. Document Released: 07/30/2000 Document Revised: 10/25/2011 Document Reviewed: 08/30/2007 [facility] Patient Information 2014 [place], [place].

## 2013-02-19 NOTE — Assessment & Plan Note (Signed)
Improved control today, no change in meds today

## 2013-02-19 NOTE — Assessment & Plan Note (Signed)
Naps frequently in evening, encouraged to try not napping during th evening

## 2013-03-21 ENCOUNTER — Other Ambulatory Visit: Payer: Self-pay

## 2013-03-23 DIAGNOSIS — H2510 Age-related nuclear cataract, unspecified eye: Secondary | ICD-10-CM | POA: Diagnosis not present

## 2013-03-23 DIAGNOSIS — H40019 Open angle with borderline findings, low risk, unspecified eye: Secondary | ICD-10-CM | POA: Diagnosis not present

## 2013-04-17 ENCOUNTER — Encounter: Payer: Self-pay | Admitting: Family Medicine

## 2013-04-17 ENCOUNTER — Ambulatory Visit (INDEPENDENT_AMBULATORY_CARE_PROVIDER_SITE_OTHER): Payer: Medicare Other | Admitting: Family Medicine

## 2013-04-17 VITALS — BP 168/88 | HR 56 | Temp 97.6°F | Ht 62.5 in | Wt 141.1 lb

## 2013-04-17 DIAGNOSIS — I10 Essential (primary) hypertension: Secondary | ICD-10-CM | POA: Diagnosis not present

## 2013-04-17 DIAGNOSIS — R3911 Hesitancy of micturition: Secondary | ICD-10-CM

## 2013-04-17 DIAGNOSIS — R21 Rash and other nonspecific skin eruption: Secondary | ICD-10-CM | POA: Diagnosis not present

## 2013-04-17 DIAGNOSIS — G4700 Insomnia, unspecified: Secondary | ICD-10-CM

## 2013-04-17 MED ORDER — TERAZOSIN HCL 1 MG PO CAPS: 1.0000 mg | ORAL_CAPSULE | Freq: Every day | ORAL | Status: DC

## 2013-04-17 NOTE — Assessment & Plan Note (Signed)
Sleeping about 5 hours, up for a while then sleeping up to 4 hours more most days.

## 2013-04-17 NOTE — Assessment & Plan Note (Addendum)
Recurrent over past year. We thought it was related to his HCTZ and when we stopped it it initially improved but erythematous scattered eruptions have returned. Will refer to dermatology for further consideration at this time

## 2013-04-17 NOTE — Progress Notes (Signed)
Patient ID: Steven Morrison, male   DOB: 12-30-1927, 79 y.o.   MRN: [id number] Steven Morrison [id number] [birth year] 04/17/2013      Progress Note-Follow Up  Subjective  Chief Complaint  Chief Complaint  Patient presents with  . Follow-up    1 month    HPI  Patient is an 79 year old male who is in today for routine followup. He has been taking his medications as prescribed. She reports she's been feeling well but does acknowledge she has high-level anxiety. Does also acknowledge that he continues to have some trouble sleeping. He sleeps about 5 hours wakes up around her feet for more hours. He manages worrying about things quite a bit. Denies chest pain, palpitations, shortness of breath, recent illness, GI or GU complaints. He stopped his Flomax and does not note any worsening nocturia. He has had a recurrence of some red lesions on his leg. Previously we stop his HCTZ thinking that would help his symptoms and initially it did but now his rash has returned. It is mildly erythematous  Past Medical History  Diagnosis Date  . HTN (hypertension) 09/12/2011  . Urinary urgency 10/12/2011  . Elevated PSA 11/03/2011  . Thrombocytopenia 11/17/2011    slight  . Rash, skin 01/02/2012    Left lower leg  . Constipation 02/10/2012  . Intertrigo 03/20/2012  . Urinary hesitancy 10/12/2011  . Dermatitis 09/13/2012  . Uncontrolled hypertension 09/12/2011    No past surgical history on file.  Family History  Problem Relation Age of Onset  . Migraines Daughter   . Allergies Daughter   . Allergies Son     History   Social History  . Marital Status: Divorced    Spouse Name: N/A    Number of Children: N/A  . Years of Education: N/A   Occupational History  . Not on file.   Social History Main Topics  . Smoking status: Never Smoker   . Smokeless tobacco: Never Used  . Alcohol Use: Yes     Comment: occasionally  . Drug Use: No  . Sexual Activity: No   Other Topics Concern  . Not on  file   Social History Narrative  . No narrative on file    Current Outpatient Prescriptions on File Prior to Visit  Medication Sig Dispense Refill  . losartan (COZAAR) 100 MG tablet Take 1 tablet (100 mg total) by mouth daily.  30 tablet  6  . nebivolol (BYSTOLIC) 10 MG tablet Take 0.5 tablets (5 mg total) by mouth daily.  21 tablet  0   No current facility-administered medications on file prior to visit.    Allergies  Allergen Reactions  . Hctz [Hydrochlorothiazide] Rash  . Bactrim [Sulfamethoxazole W-Trimethoprim] Rash    Review of Systems  Review of Systems  Constitutional: Negative for fever and malaise/fatigue.  HENT: Positive for tinnitus. Negative for congestion.        Short lived episode of right sided tinnitus last week without any other symptoms  Eyes: Negative for discharge.  Respiratory: Negative for shortness of breath.   Cardiovascular: Negative for chest pain, palpitations and leg swelling.  Gastrointestinal: Negative for nausea, abdominal pain and diarrhea.  Genitourinary: Negative for dysuria.  Musculoskeletal: Negative for falls.  Skin: Negative for rash.  Neurological: Negative for loss of consciousness and headaches.  Endo/Heme/Allergies: Negative for polydipsia.  Psychiatric/Behavioral: Negative for depression and suicidal ideas. The patient is not nervous/anxious and does not have insomnia.     Objective  BP 168/88  Pulse 56  Temp(Src) 97.6 F (36.4 C) (Oral)  Ht 5' 2.5" (1.588 m)  Wt 141 lb 1.9 oz (64.012 kg)  BMI 25.38 kg/m2  SpO2 97%  Physical Exam  Physical Exam  Constitutional: He is oriented to person, place, and time and well-developed, well-nourished, and in no distress. No distress.  HENT:  Head: Normocephalic and atraumatic.  Eyes: Conjunctivae are normal.  Neck: Neck supple. No thyromegaly present.  Cardiovascular: Normal rate, regular rhythm and normal heart sounds.   No murmur heard. Pulmonary/Chest: Effort normal and  breath sounds normal. No respiratory distress.  Abdominal: He exhibits no distension and no mass. There is no tenderness.  Musculoskeletal: He exhibits no edema.  Neurological: He is alert and oriented to person, place, and time.  Skin: Skin is warm. Rash noted. There is erythema.  Oval shaped erythemaotus raised patches on right tibial plateau  Psychiatric: Memory, affect and judgment normal.    Lab Results  Component Value Date   TSH 2.494 01/22/2013   Lab Results  Component Value Date   WBC 4.9 01/22/2013   HGB 13.5 01/22/2013   HCT 39.9 01/22/2013   MCV 79.5 01/22/2013   PLT 150 01/22/2013   Lab Results  Component Value Date   CREATININE 0.98 01/22/2013   BUN 10 01/22/2013   NA 139 01/22/2013   K 4.1 01/22/2013   CL 104 01/22/2013   CO2 26 01/22/2013   Lab Results  Component Value Date   ALT 20 01/22/2013   AST 21 01/22/2013   ALKPHOS 46 01/22/2013   BILITOT 0.5 01/22/2013   Lab Results  Component Value Date   CHOL 194 10/12/2011   Lab Results  Component Value Date   HDL 46.70 10/12/2011   Lab Results  Component Value Date   LDLCALC 118* 10/12/2011   Lab Results  Component Value Date   TRIG 147.0 10/12/2011   Lab Results  Component Value Date   CHOLHDL 4 10/12/2011     Assessment & Plan  Elevated PSA Is doing better even without Tamulosin will not restart at this time.   Insomnia Sleeping about 5 hours, up for a while then sleeping up to 4 hours more most days.  Rash and nonspecific skin eruption Recurrent over past year. We thought it was related to his HCTZ and when we stopped it it initially improved but erythematous scattered eruptions have returned. Will refer to dermatology for further consideration at this time  Uncontrolled hypertension Will try a course of Hytrin 1 mg qhs, to help his prostate and his bp, patient will report any concerning symptoms. Minimize sodium and caffeine

## 2013-04-17 NOTE — Patient Instructions (Addendum)

## 2013-04-17 NOTE — Assessment & Plan Note (Signed)
Is doing better even without Tamulosin will not restart at this time.

## 2013-04-17 NOTE — Assessment & Plan Note (Signed)
Will try a course of Hytrin 1 mg qhs, to help his prostate and his bp, patient will report any concerning symptoms. Minimize sodium and caffeine

## 2013-04-30 ENCOUNTER — Encounter: Payer: Self-pay | Admitting: Family Medicine

## 2013-04-30 DIAGNOSIS — I10 Essential (primary) hypertension: Secondary | ICD-10-CM

## 2013-04-30 MED ORDER — NEBIVOLOL HCL 10 MG PO TABS: 5.0000 mg | ORAL_TABLET | Freq: Every day | ORAL | Status: DC

## 2013-05-15 ENCOUNTER — Encounter: Payer: Self-pay | Admitting: Family Medicine

## 2013-05-15 ENCOUNTER — Ambulatory Visit (INDEPENDENT_AMBULATORY_CARE_PROVIDER_SITE_OTHER): Payer: Medicare Other | Admitting: Family Medicine

## 2013-05-15 VITALS — BP 160/64 | HR 54 | Temp 97.8°F | Ht 62.5 in | Wt 140.0 lb

## 2013-05-15 DIAGNOSIS — I10 Essential (primary) hypertension: Secondary | ICD-10-CM | POA: Diagnosis not present

## 2013-05-15 DIAGNOSIS — L259 Unspecified contact dermatitis, unspecified cause: Secondary | ICD-10-CM

## 2013-05-15 DIAGNOSIS — L309 Dermatitis, unspecified: Secondary | ICD-10-CM

## 2013-05-15 MED ORDER — LOSARTAN POTASSIUM 100 MG PO TABS: 100.0000 mg | ORAL_TABLET | Freq: Every day | ORAL | Status: DC

## 2013-05-15 MED ORDER — ASPIRIN EC 81 MG PO TBEC: 81.0000 mg | DELAYED_RELEASE_TABLET | Freq: Every day | ORAL | Status: DC

## 2013-05-15 NOTE — Assessment & Plan Note (Signed)
Patient had to reschedule his dermatology appt until October due to a family illness, his dermatitis is improved at this time.

## 2013-05-15 NOTE — Patient Instructions (Addendum)

## 2013-05-15 NOTE — Assessment & Plan Note (Signed)
Improved control on recheck, he reports checking his bp infrequently at home and seeing improved numbers at home,he is asymptomatic. He is asked to check his bp weekly at home. Minimize sodium

## 2013-05-15 NOTE — Progress Notes (Signed)
Patient ID: Steven Morrison, male   DOB: 1927/10/25, 79 y.o.   MRN: [id number] Steven Morrison [id number] 02-Mar-1928 05/15/2013      Progress Note-Follow Up  Subjective  Chief Complaint  Chief Complaint  Patient presents with  . Follow-up    4 week    HPI  Patient is an 79 year old male who is in today for followup on his blood pressure. He reports feeling well. No recent illness. And headaches, chest pain, palpitations, shortness of breath, congestion, GI or GU complaints. He is taking his medications routinely. He notes he continues to struggle with dermatitis but his symptoms are improving. He has an appointment with dermatology but is not felt for  Past Medical History  Diagnosis Date  . HTN (hypertension) 09/12/2011  . Urinary urgency 10/12/2011  . Elevated PSA 11/03/2011  . Thrombocytopenia 11/17/2011    slight  . Rash, skin 01/02/2012    Left lower leg  . Constipation 02/10/2012  . Intertrigo 03/20/2012  . Urinary hesitancy 10/12/2011  . Dermatitis 09/13/2012  . Uncontrolled hypertension 09/12/2011    History reviewed. No pertinent past surgical history.  Family History  Problem Relation Age of Onset  . Migraines Daughter   . Allergies Daughter   . Allergies Son     History   Social History  . Marital Status: Divorced    Spouse Name: N/A    Number of Children: N/A  . Years of Education: N/A   Occupational History  . Not on file.   Social History Main Topics  . Smoking status: Never Smoker   . Smokeless tobacco: Never Used  . Alcohol Use: Yes     Comment: occasionally  . Drug Use: No  . Sexual Activity: No   Other Topics Concern  . Not on file   Social History Narrative  . No narrative on file    Current Outpatient Prescriptions on File Prior to Visit  Medication Sig Dispense Refill  . losartan (COZAAR) 100 MG tablet Take 1 tablet (100 mg total) by mouth daily.  30 tablet  6  . nebivolol (BYSTOLIC) 10 MG tablet Take 0.5 tablets (5 mg total) by  mouth daily.  21 tablet  1  . terazosin (HYTRIN) 1 MG capsule Take 1 capsule (1 mg total) by mouth at bedtime.  30 capsule  2   No current facility-administered medications on file prior to visit.    Allergies  Allergen Reactions  . Hctz [Hydrochlorothiazide] Rash  . Bactrim [Sulfamethoxazole-Trimethoprim] Rash    Review of Systems  Review of Systems  Constitutional: Negative for fever and malaise/fatigue.  HENT: Negative for congestion.   Eyes: Negative for discharge.  Respiratory: Negative for shortness of breath.   Cardiovascular: Negative for chest pain, palpitations and leg swelling.  Gastrointestinal: Negative for nausea, abdominal pain and diarrhea.  Genitourinary: Negative for dysuria.  Musculoskeletal: Negative for falls.  Skin: Negative for rash.  Neurological: Negative for loss of consciousness and headaches.  Endo/Heme/Allergies: Negative for polydipsia.  Psychiatric/Behavioral: Negative for depression and suicidal ideas. The patient is not nervous/anxious and does not have insomnia.     Objective  BP 184/72  Pulse 54  Temp(Src) 97.8 F (36.6 C) (Oral)  Ht 5' 2.5" (1.588 m)  Wt 140 lb 0.6 oz (63.522 kg)  BMI 25.19 kg/m2  SpO2 96%  Physical Exam  Physical Exam  Constitutional: He is oriented to person, place, and time and well-developed, well-nourished, and in no distress. No distress.  HENT:  Head: Normocephalic and  atraumatic.  Eyes: Conjunctivae are normal.  Neck: Neck supple. No thyromegaly present.  Cardiovascular: Normal rate, regular rhythm and normal heart sounds.   No murmur heard. Pulmonary/Chest: Effort normal and breath sounds normal. No respiratory distress.  Abdominal: He exhibits no distension and no mass. There is no tenderness.  Musculoskeletal: He exhibits no edema.  Neurological: He is alert and oriented to person, place, and time.  Skin: Skin is warm.  Psychiatric: Memory, affect and judgment normal.    Lab Results  Component  Value Date   TSH 2.494 01/22/2013   Lab Results  Component Value Date   WBC 4.9 01/22/2013   HGB 13.5 01/22/2013   HCT 39.9 01/22/2013   MCV 79.5 01/22/2013   PLT 150 01/22/2013   Lab Results  Component Value Date   CREATININE 0.98 01/22/2013   BUN 10 01/22/2013   NA 139 01/22/2013   K 4.1 01/22/2013   CL 104 01/22/2013   CO2 26 01/22/2013   Lab Results  Component Value Date   ALT 20 01/22/2013   AST 21 01/22/2013   ALKPHOS 46 01/22/2013   BILITOT 0.5 01/22/2013   Lab Results  Component Value Date   CHOL 194 10/12/2011   Lab Results  Component Value Date   HDL 46.70 10/12/2011   Lab Results  Component Value Date   LDLCALC 118* 10/12/2011   Lab Results  Component Value Date   TRIG 147.0 10/12/2011   Lab Results  Component Value Date   CHOLHDL 4 10/12/2011     Assessment & Plan  Dermatitis Patient had to reschedule his dermatology appt until October due to a family illness, his dermatitis is improved at this time.  Uncontrolled hypertension Improved control on recheck, he reports checking his bp infrequently at home and seeing improved numbers at home,he is asymptomatic. He is asked to check his bp weekly at home. Minimize sodium

## 2013-05-22 DIAGNOSIS — H40019 Open angle with borderline findings, low risk, unspecified eye: Secondary | ICD-10-CM | POA: Diagnosis not present

## 2013-06-20 DIAGNOSIS — L259 Unspecified contact dermatitis, unspecified cause: Secondary | ICD-10-CM | POA: Diagnosis not present

## 2013-07-03 ENCOUNTER — Ambulatory Visit (INDEPENDENT_AMBULATORY_CARE_PROVIDER_SITE_OTHER): Payer: Medicare Other | Admitting: Family Medicine

## 2013-07-03 ENCOUNTER — Encounter: Payer: Self-pay | Admitting: Family Medicine

## 2013-07-03 VITALS — BP 174/94 | HR 64 | Temp 98.2°F | Ht 62.5 in | Wt 143.1 lb

## 2013-07-03 DIAGNOSIS — I10 Essential (primary) hypertension: Secondary | ICD-10-CM

## 2013-07-03 DIAGNOSIS — L259 Unspecified contact dermatitis, unspecified cause: Secondary | ICD-10-CM

## 2013-07-03 DIAGNOSIS — L309 Dermatitis, unspecified: Secondary | ICD-10-CM

## 2013-07-03 MED ORDER — NEBIVOLOL HCL 10 MG PO TABS: 10.0000 mg | ORAL_TABLET | Freq: Every day | ORAL | Status: DC

## 2013-07-03 NOTE — Progress Notes (Signed)
Patient ID: Steven Morrison, male   DOB: [birth year], 79 y.o.   MRN: [id number] Steven Morrison [id number] 11-04-1927 07/03/2013      Progress Note-Follow Up  Subjective  Chief Complaint  Chief Complaint  Patient presents with  . Follow-up    7 week    HPI  Patient is an 79 year old male who is in today for followup on his blood pressure. He continues to feel well. He denies any headaches, chest pain, palpitations, shortness of breath he, flushing, GI or GU concerns. He acknowledges being under stress as he is trying to move but otherwise offers no complaints. Taught to avoid sodium and maintain a heart healthy diet and has been taking medications as prescribed  Past Medical History  Diagnosis Date  . HTN (hypertension) 09/12/2011  . Urinary urgency 10/12/2011  . Elevated PSA 11/03/2011  . Thrombocytopenia 11/17/2011    slight  . Rash, skin 01/02/2012    Left lower leg  . Constipation 02/10/2012  . Intertrigo 03/20/2012  . Urinary hesitancy 10/12/2011  . Dermatitis 09/13/2012  . Uncontrolled hypertension 09/12/2011    History reviewed. No pertinent past surgical history.  Family History  Problem Relation Age of Onset  . Migraines Daughter   . Allergies Daughter   . Allergies Son     History   Social History  . Marital Status: Divorced    Spouse Name: N/A    Number of Children: N/A  . Years of Education: N/A   Occupational History  . Not on file.   Social History Main Topics  . Smoking status: Never Smoker   . Smokeless tobacco: Never Used  . Alcohol Use: Yes     Comment: occasionally  . Drug Use: No  . Sexual Activity: No   Other Topics Concern  . Not on file   Social History Narrative  . No narrative on file    Current Outpatient Prescriptions on File Prior to Visit  Medication Sig Dispense Refill  . aspirin EC 81 MG tablet Take 1 tablet (81 mg total) by mouth daily.      Marland Kitchen losartan (COZAAR) 100 MG tablet Take 1 tablet (100 mg total) by mouth  daily.  30 tablet  6  . nebivolol (BYSTOLIC) 10 MG tablet Take 0.5 tablets (5 mg total) by mouth daily.  21 tablet  1  . terazosin (HYTRIN) 1 MG capsule Take 1 capsule (1 mg total) by mouth at bedtime.  30 capsule  2   No current facility-administered medications on file prior to visit.    Allergies  Allergen Reactions  . Hctz [Hydrochlorothiazide] Rash  . Bactrim [Sulfamethoxazole-Trimethoprim] Rash    Review of Systems  Review of Systems  Constitutional: Negative for fever and malaise/fatigue.  HENT: Negative for congestion.   Eyes: Negative for discharge.  Respiratory: Negative for shortness of breath.   Cardiovascular: Negative for chest pain, palpitations and leg swelling.  Gastrointestinal: Negative for nausea, abdominal pain and diarrhea.  Genitourinary: Negative for dysuria.  Musculoskeletal: Negative for falls.  Skin: Negative for rash.  Neurological: Negative for loss of consciousness and headaches.  Endo/Heme/Allergies: Negative for polydipsia.  Psychiatric/Behavioral: Negative for depression and suicidal ideas. The patient is not nervous/anxious and does not have insomnia.     Objective  BP 212/88  Pulse 55  Temp(Src) 98.2 F (36.8 C) (Oral)  Ht 5' 2.5" (1.588 m)  Wt 143 lb 1.3 oz (64.901 kg)  BMI 25.74 kg/m2  SpO2 98%  Physical Exam  Physical Exam  Constitutional: He is oriented to person, place, and time and well-developed, well-nourished, and in no distress. No distress.  HENT:  Head: Normocephalic and atraumatic.  Eyes: Conjunctivae are normal.  Neck: Neck supple. No thyromegaly present.  Cardiovascular: Normal rate, regular rhythm and normal heart sounds.   No murmur heard. Pulmonary/Chest: Effort normal and breath sounds normal. No respiratory distress.  Abdominal: He exhibits no distension and no mass. There is no tenderness.  Musculoskeletal: He exhibits no edema.  Neurological: He is alert and oriented to person, place, and time.  Skin: Skin  is warm.  Psychiatric: Memory, affect and judgment normal.    Lab Results  Component Value Date   TSH 2.494 01/22/2013   Lab Results  Component Value Date   WBC 4.9 01/22/2013   HGB 13.5 01/22/2013   HCT 39.9 01/22/2013   MCV 79.5 01/22/2013   PLT 150 01/22/2013   Lab Results  Component Value Date   CREATININE 0.98 01/22/2013   BUN 10 01/22/2013   NA 139 01/22/2013   K 4.1 01/22/2013   CL 104 01/22/2013   CO2 26 01/22/2013   Lab Results  Component Value Date   ALT 20 01/22/2013   AST 21 01/22/2013   ALKPHOS 46 01/22/2013   BILITOT 0.5 01/22/2013   Lab Results  Component Value Date   CHOL 194 10/12/2011   Lab Results  Component Value Date   HDL 46.70 10/12/2011   Lab Results  Component Value Date   LDLCALC 118* 10/12/2011   Lab Results  Component Value Date   TRIG 147.0 10/12/2011   Lab Results  Component Value Date   CHOLHDL 4 10/12/2011     Assessment & Plan    Dermatitis Encouraged hydration and does have a cream from dermatology he finds helpful may continue the same cream  Uncontrolled hypertension Will increase Bystolic to 10 mg daily and recheck next month. Patient unable to do blood work today agrees to do labs at next visit

## 2013-07-03 NOTE — Patient Instructions (Signed)

## 2013-07-03 NOTE — Assessment & Plan Note (Signed)
Encouraged hydration and does have a cream from dermatology he finds helpful may continue the same cream

## 2013-07-03 NOTE — Progress Notes (Signed)
Pre visit review using our clinic review tool, if applicable. No additional management support is needed unless otherwise documented below in the visit note. 

## 2013-07-03 NOTE — Assessment & Plan Note (Signed)
Will increase Bystolic to 10 mg daily and recheck next month. Patient unable to do blood work today agrees to do labs at next visit

## 2013-08-02 ENCOUNTER — Encounter: Payer: Self-pay | Admitting: Family Medicine

## 2013-08-02 ENCOUNTER — Ambulatory Visit (INDEPENDENT_AMBULATORY_CARE_PROVIDER_SITE_OTHER): Payer: Medicare Other | Admitting: Family Medicine

## 2013-08-02 VITALS — BP 174/84 | HR 50 | Temp 97.9°F | Ht 62.5 in | Wt 142.1 lb

## 2013-08-02 DIAGNOSIS — R21 Rash and other nonspecific skin eruption: Secondary | ICD-10-CM

## 2013-08-02 DIAGNOSIS — I10 Essential (primary) hypertension: Secondary | ICD-10-CM

## 2013-08-02 NOTE — Patient Instructions (Signed)

## 2013-08-02 NOTE — Progress Notes (Signed)
Pre visit review using our clinic review tool, if applicable. No additional management support is needed unless otherwise documented below in the visit note. 

## 2013-08-02 NOTE — Progress Notes (Signed)
Patient ID: Steven Morrison, male   DOB: 06/16/1928, 79 y.o.   MRN: [id number] Steven Morrison [id number] April 04, 1928 08/02/2013      Progress Note-Follow Up  Subjective  Chief Complaint  Chief Complaint  Patient presents with  . Follow-up    4 week    HPI  Patient is an 79 year old male who is in today for followup on his blood pressure. He feels well. He denies any headaches, chest pain, palpitations, increased fatigue, GI or GU concerns. He is taking his medications as prescribed.  Past Medical History  Diagnosis Date  . HTN (hypertension) 09/12/2011  . Urinary urgency 10/12/2011  . Elevated PSA 11/03/2011  . Thrombocytopenia 11/17/2011    slight  . Rash, skin 01/02/2012    Left lower leg  . Constipation 02/10/2012  . Intertrigo 03/20/2012  . Urinary hesitancy 10/12/2011  . Dermatitis 09/13/2012  . Uncontrolled hypertension 09/12/2011    History reviewed. No pertinent past surgical history.  Family History  Problem Relation Age of Onset  . Migraines Daughter   . Allergies Daughter   . Allergies Son     History   Social History  . Marital Status: Divorced    Spouse Name: N/A    Number of Children: N/A  . Years of Education: N/A   Occupational History  . Not on file.   Social History Main Topics  . Smoking status: Never Smoker   . Smokeless tobacco: Never Used  . Alcohol Use: Yes     Comment: occasionally  . Drug Use: No  . Sexual Activity: No   Other Topics Concern  . Not on file   Social History Narrative  . No narrative on file    Current Outpatient Prescriptions on File Prior to Visit  Medication Sig Dispense Refill  . aspirin EC 81 MG tablet Take 1 tablet (81 mg total) by mouth daily.      Marland Kitchen losartan (COZAAR) 100 MG tablet Take 1 tablet (100 mg total) by mouth daily.  30 tablet  6  . nebivolol (BYSTOLIC) 10 MG tablet Take 1 tablet (10 mg total) by mouth daily.  30 tablet  1  . terazosin (HYTRIN) 1 MG capsule Take 1 capsule (1 mg total) by  mouth at bedtime.  30 capsule  2   No current facility-administered medications on file prior to visit.    Allergies  Allergen Reactions  . Hctz [Hydrochlorothiazide] Rash  . Bactrim [Sulfamethoxazole-Trimethoprim] Rash    Review of Systems  Review of Systems  Constitutional: Negative for fever, chills and malaise/fatigue.  HENT: Negative for congestion, hearing loss and nosebleeds.   Eyes: Negative for discharge.  Respiratory: Negative for cough, sputum production, shortness of breath and wheezing.   Cardiovascular: Negative for chest pain, palpitations and leg swelling.  Gastrointestinal: Negative for heartburn, nausea, vomiting, abdominal pain, diarrhea, constipation and blood in stool.  Genitourinary: Negative for dysuria, urgency, frequency and hematuria.  Musculoskeletal: Negative for back pain, falls and myalgias.  Skin: Negative for rash.  Neurological: Negative for dizziness, tremors, sensory change, focal weakness, loss of consciousness, weakness and headaches.  Endo/Heme/Allergies: Negative for polydipsia. Does not bruise/bleed easily.  Psychiatric/Behavioral: Negative for depression and suicidal ideas. The patient is not nervous/anxious and does not have insomnia.     Objective  BP 200/86  Pulse 50  Temp(Src) 97.9 F (36.6 C) (Oral)  Ht 5' 2.5" (1.588 m)  Wt 142 lb 1.3 oz (64.447 kg)  BMI 25.56 kg/m2  SpO2 98%  Physical Exam  Physical Exam  Constitutional: He is oriented to person, place, and time and well-developed, well-nourished, and in no distress. No distress.  HENT:  Head: Normocephalic and atraumatic.  Eyes: Conjunctivae are normal.  Neck: Neck supple. No thyromegaly present.  Cardiovascular: Normal rate, regular rhythm and normal heart sounds.   No murmur heard. Pulmonary/Chest: Effort normal and breath sounds normal. No respiratory distress.  Abdominal: He exhibits no distension and no mass. There is no tenderness.  Musculoskeletal: He exhibits  no edema.  Neurological: He is alert and oriented to person, place, and time.  Skin: Skin is warm.  Psychiatric: Memory, affect and judgment normal.    Lab Results  Component Value Date   TSH 2.494 01/22/2013   Lab Results  Component Value Date   WBC 4.9 01/22/2013   HGB 13.5 01/22/2013   HCT 39.9 01/22/2013   MCV 79.5 01/22/2013   PLT 150 01/22/2013   Lab Results  Component Value Date   CREATININE 0.98 01/22/2013   BUN 10 01/22/2013   NA 139 01/22/2013   K 4.1 01/22/2013   CL 104 01/22/2013   CO2 26 01/22/2013   Lab Results  Component Value Date   ALT 20 01/22/2013   AST 21 01/22/2013   ALKPHOS 46 01/22/2013   BILITOT 0.5 01/22/2013   Lab Results  Component Value Date   CHOL 194 10/12/2011   Lab Results  Component Value Date   HDL 46.70 10/12/2011   Lab Results  Component Value Date   LDLCALC 118* 10/12/2011   Lab Results  Component Value Date   TRIG 147.0 10/12/2011   Lab Results  Component Value Date   CHOLHDL 4 10/12/2011     Assessment & Plan  Uncontrolled hypertension Patient reports he feels well and agrees to begin checking bp at home no changes in meds today.   Rash and nonspecific skin eruption Skin doing better off HCTZ

## 2013-08-06 NOTE — Assessment & Plan Note (Signed)
Skin doing better off HCTZ

## 2013-08-06 NOTE — Assessment & Plan Note (Signed)
Patient reports he feels well and agrees to begin checking bp at home no changes in meds today.

## 2013-08-12 ENCOUNTER — Other Ambulatory Visit: Payer: Self-pay | Admitting: Family Medicine

## 2013-11-01 ENCOUNTER — Encounter: Payer: Self-pay | Admitting: Family Medicine

## 2013-11-01 DIAGNOSIS — I10 Essential (primary) hypertension: Secondary | ICD-10-CM

## 2013-11-01 MED ORDER — NEBIVOLOL HCL 10 MG PO TABS: 10.0000 mg | ORAL_TABLET | Freq: Every day | ORAL | Status: DC

## 2013-12-17 ENCOUNTER — Other Ambulatory Visit: Payer: Self-pay | Admitting: Family Medicine

## 2013-12-17 ENCOUNTER — Encounter: Payer: Self-pay | Admitting: Family Medicine

## 2013-12-17 ENCOUNTER — Telehealth: Payer: Self-pay | Admitting: Family Medicine

## 2013-12-17 ENCOUNTER — Ambulatory Visit (INDEPENDENT_AMBULATORY_CARE_PROVIDER_SITE_OTHER): Payer: Medicare Other | Admitting: Family Medicine

## 2013-12-17 VITALS — BP 204/80 | HR 53 | Temp 98.3°F | Ht 62.5 in | Wt 139.0 lb

## 2013-12-17 DIAGNOSIS — F411 Generalized anxiety disorder: Secondary | ICD-10-CM

## 2013-12-17 DIAGNOSIS — I10 Essential (primary) hypertension: Secondary | ICD-10-CM

## 2013-12-17 DIAGNOSIS — E785 Hyperlipidemia, unspecified: Secondary | ICD-10-CM

## 2013-12-17 DIAGNOSIS — R0600 Dyspnea, unspecified: Secondary | ICD-10-CM

## 2013-12-17 DIAGNOSIS — R7309 Other abnormal glucose: Secondary | ICD-10-CM | POA: Diagnosis not present

## 2013-12-17 DIAGNOSIS — B351 Tinea unguium: Secondary | ICD-10-CM

## 2013-12-17 DIAGNOSIS — R739 Hyperglycemia, unspecified: Secondary | ICD-10-CM

## 2013-12-17 DIAGNOSIS — G4700 Insomnia, unspecified: Secondary | ICD-10-CM

## 2013-12-17 DIAGNOSIS — D696 Thrombocytopenia, unspecified: Secondary | ICD-10-CM

## 2013-12-17 DIAGNOSIS — F329 Major depressive disorder, single episode, unspecified: Secondary | ICD-10-CM

## 2013-12-17 DIAGNOSIS — Z0000 Encounter for general adult medical examination without abnormal findings: Secondary | ICD-10-CM | POA: Diagnosis not present

## 2013-12-17 DIAGNOSIS — F419 Anxiety disorder, unspecified: Secondary | ICD-10-CM

## 2013-12-17 LAB — CBC
HEMATOCRIT: 40.5 % (ref 39.0–52.0)
HEMOGLOBIN: 13.8 g/dL (ref 13.0–17.0)
MCH: 26.7 pg (ref 26.0–34.0)
MCHC: 34.1 g/dL (ref 30.0–36.0)
MCV: 78.5 fL (ref 78.0–100.0)
Platelets: 141 10*3/uL — ABNORMAL LOW (ref 150–400)
RBC: 5.16 MIL/uL (ref 4.22–5.81)
RDW: 15.2 % (ref 11.5–15.5)
WBC: 4.5 10*3/uL (ref 4.0–10.5)

## 2013-12-17 LAB — HEMOGLOBIN A1C
Hgb A1c MFr Bld: 5.9 % — ABNORMAL HIGH (ref <5.7)
Mean Plasma Glucose: 123 mg/dL — ABNORMAL HIGH (ref <117)

## 2013-12-17 MED ORDER — LORAZEPAM 0.5 MG PO TABS: 0.5000 mg | ORAL_TABLET | Freq: Two times a day (BID) | ORAL | Status: DC | PRN

## 2013-12-17 MED ORDER — ESCITALOPRAM OXALATE 5 MG PO TABS: 5.0000 mg | ORAL_TABLET | Freq: Every day | ORAL | Status: DC

## 2013-12-17 NOTE — Telephone Encounter (Signed)
Relevant patient education assigned to patient using Emmi. ° °

## 2013-12-17 NOTE — Progress Notes (Signed)
Patient ID: Steven Morrison, male   DOB: [birth year], 80 y.o.   MRN: [id number] TOBY BREITHAUPT [id number] [birth year] 12/17/2013      Progress Note-Follow Up  Subjective  Chief Complaint  Chief Complaint  Patient presents with  . Annual Exam    physical    HPI  Patient is a 80 year old male in today for routine medical care. He is feeling well. He denies any recent illness. He denies any headache, chest pain, palpitations or shortness of breath. He acknowledges he's had a stressful several months with her recent move. He did not his increased anxiety and worry. He does note whenever he coming in for a visit he feels very stressed. He has been taking his medications as prescribed. He is not taking any over-the-counter medications.  Past Medical History  Diagnosis Date  . HTN (hypertension) 09/12/2011  . Urinary urgency 10/12/2011  . Elevated PSA 11/03/2011  . Thrombocytopenia 11/17/2011    slight  . Rash, skin 01/02/2012    Left lower leg  . Constipation 02/10/2012  . Intertrigo 03/20/2012  . Urinary hesitancy 10/12/2011  . Dermatitis 09/13/2012  . Uncontrolled hypertension 09/12/2011    History reviewed. No pertinent past surgical history.  Family History  Problem Relation Age of Onset  . Migraines Daughter   . Allergies Daughter   . Allergies Son     History   Social History  . Marital Status: Divorced    Spouse Name: N/A    Number of Children: N/A  . Years of Education: N/A   Occupational History  . Not on file.   Social History Main Topics  . Smoking status: Never Smoker   . Smokeless tobacco: Never Used  . Alcohol Use: Yes     Comment: occasionally  . Drug Use: No  . Sexual Activity: No   Other Topics Concern  . Not on file   Social History Narrative  . No narrative on file    Current Outpatient Prescriptions on File Prior to Visit  Medication Sig Dispense Refill  . aspirin EC 81 MG tablet Take 1 tablet (81 mg total) by mouth daily.      Marland Kitchen  losartan (COZAAR) 100 MG tablet Take 1 tablet (100 mg total) by mouth daily.  30 tablet  6  . nebivolol (BYSTOLIC) 10 MG tablet Take 1 tablet (10 mg total) by mouth daily.  30 tablet  2  . terazosin (HYTRIN) 1 MG capsule TAKE 1 CAPSULE BY MOUTH EVERY NIGHT AT BEDTIME  30 capsule  3   No current facility-administered medications on file prior to visit.    Allergies  Allergen Reactions  . Hctz [Hydrochlorothiazide] Rash  . Bactrim [Sulfamethoxazole-Trimethoprim] Rash    Review of Systems  Review of Systems  Constitutional: Negative for fever and malaise/fatigue.  HENT: Negative for congestion.   Eyes: Negative for discharge.  Respiratory: Negative for shortness of breath.   Cardiovascular: Negative for chest pain, palpitations and leg swelling.  Gastrointestinal: Negative for nausea, abdominal pain and diarrhea.  Genitourinary: Negative for dysuria.  Musculoskeletal: Negative for falls.  Skin: Negative for rash.  Neurological: Negative for loss of consciousness and headaches.  Endo/Heme/Allergies: Negative for polydipsia.  Psychiatric/Behavioral: Negative for depression and suicidal ideas. The patient is not nervous/anxious and does not have insomnia.     Objective  BP 204/80  Pulse 53  Temp(Src) 98.3 F (36.8 C) (Oral)  Ht 5' 2.5" (1.588 m)  Wt 139 lb (63.05 kg)  BMI 25.00 kg/m2  SpO2 98%  Physical Exam  Physical Exam  Constitutional: He is oriented to person, place, and time and well-developed, well-nourished, and in no distress. No distress.  HENT:  Head: Normocephalic and atraumatic.  Eyes: Conjunctivae are normal.  Neck: Neck supple. No thyromegaly present.  Cardiovascular: Normal rate, regular rhythm and normal heart sounds.   No murmur heard. Pulmonary/Chest: Effort normal and breath sounds normal. No respiratory distress.  Abdominal: He exhibits no distension and no mass. There is no tenderness.  Musculoskeletal: He exhibits no edema.  Neurological: He is  alert and oriented to person, place, and time.  Skin: Skin is warm.  Psychiatric: Memory, affect and judgment normal.    Lab Results  Component Value Date   TSH 2.494 01/22/2013   Lab Results  Component Value Date   WBC 4.9 01/22/2013   HGB 13.5 01/22/2013   HCT 39.9 01/22/2013   MCV 79.5 01/22/2013   PLT 150 01/22/2013   Lab Results  Component Value Date   CREATININE 0.98 01/22/2013   BUN 10 01/22/2013   NA 139 01/22/2013   K 4.1 01/22/2013   CL 104 01/22/2013   CO2 26 01/22/2013   Lab Results  Component Value Date   ALT 20 01/22/2013   AST 21 01/22/2013   ALKPHOS 46 01/22/2013   BILITOT 0.5 01/22/2013   Lab Results  Component Value Date   CHOL 194 10/12/2011   Lab Results  Component Value Date   HDL 46.70 10/12/2011   Lab Results  Component Value Date   LDLCALC 118* 10/12/2011   Lab Results  Component Value Date   TRIG 147.0 10/12/2011   Lab Results  Component Value Date   CHOLHDL 4 10/12/2011     Assessment & Plan  Uncontrolled hypertension Continues to have poor control, he agrees to referral to cardiology for further consideration.   Onychomycosis of toenail Given rx for Jublia and encouraged to soak feet in Vinegar nightly.  Insomnia May try Melatonin 5- 10 mg prn  Thrombocytopenia Mild, stable  Hyperglycemia miniize simple carbs. Increase exercise as tolerated.   Medicare annual wellness visit, subsequent Patient denies any difficulties at home. No trouble with ADLs, depression or falls. No recent changes to vision or hearing. Is UTD with immunizations. Is UTD with screening. Discussed Advanced Directives, patient agrees to bring [place] copies of documents if can. Encouraged heart healthy diet, exercise as tolerated and adequate sleep.   Anxiety state, unspecified I believe this is contributing to his HTN, he agrees to start Escitalopram 5 mg daily and to start Lorazepam 0.5 mg prn as well

## 2013-12-17 NOTE — Progress Notes (Signed)
Pre visit review using our clinic review tool, if applicable. No additional management support is needed unless otherwise documented below in the visit note. 

## 2013-12-17 NOTE — Patient Instructions (Signed)

## 2013-12-18 ENCOUNTER — Telehealth: Payer: Self-pay

## 2013-12-18 LAB — RENAL FUNCTION PANEL
Albumin: 4.3 g/dL (ref 3.5–5.2)
BUN: 9 mg/dL (ref 6–23)
CHLORIDE: 101 meq/L (ref 96–112)
CO2: 26 meq/L (ref 19–32)
CREATININE: 1.05 mg/dL (ref 0.50–1.35)
Calcium: 9 mg/dL (ref 8.4–10.5)
Glucose, Bld: 96 mg/dL (ref 70–99)
POTASSIUM: 4.3 meq/L (ref 3.5–5.3)
Phosphorus: 2.8 mg/dL (ref 2.3–4.6)
Sodium: 137 mEq/L (ref 135–145)

## 2013-12-18 LAB — LIPID PANEL
Cholesterol: 148 mg/dL (ref 0–200)
HDL: 34 mg/dL — ABNORMAL LOW (ref >39)
LDL CALC: 90 mg/dL (ref 0–99)
TRIGLYCERIDES: 122 mg/dL (ref <150)
Total CHOL/HDL Ratio: 4.4 Ratio
VLDL: 24 mg/dL (ref 0–40)

## 2013-12-18 LAB — HEPATIC FUNCTION PANEL
ALBUMIN: 4.3 g/dL (ref 3.5–5.2)
ALK PHOS: 51 U/L (ref 39–117)
ALT: 17 U/L (ref 0–53)
AST: 16 U/L (ref 0–37)
BILIRUBIN INDIRECT: 0.5 mg/dL (ref 0.2–1.2)
BILIRUBIN TOTAL: 0.6 mg/dL (ref 0.2–1.2)
Bilirubin, Direct: 0.1 mg/dL (ref 0.0–0.3)
Total Protein: 6.8 g/dL (ref 6.0–8.3)

## 2013-12-18 LAB — TSH: TSH: 1.877 u[IU]/mL (ref 0.350–4.500)

## 2013-12-18 NOTE — Telephone Encounter (Signed)
pts daughter Werner Lean left a message stating that she would like a call back to why her father is being referred to cardiology? pts daughter stated on vm that pt doesn't understand is getting worried? Please advise (is it due to the high BP)?

## 2013-12-18 NOTE — Telephone Encounter (Signed)
Patients daughter informed

## 2013-12-18 NOTE — Telephone Encounter (Signed)
Yes it is just due to his persistently hi blood pressure and some very mild SOB. I have not found anything else. It is just time to see if they have any further suggestions for getting it down.

## 2013-12-19 ENCOUNTER — Encounter: Payer: Self-pay | Admitting: Family Medicine

## 2013-12-19 DIAGNOSIS — F411 Generalized anxiety disorder: Secondary | ICD-10-CM

## 2013-12-19 DIAGNOSIS — R739 Hyperglycemia, unspecified: Secondary | ICD-10-CM | POA: Insufficient documentation

## 2013-12-19 DIAGNOSIS — F419 Anxiety disorder, unspecified: Secondary | ICD-10-CM | POA: Insufficient documentation

## 2013-12-19 HISTORY — DX: Generalized anxiety disorder: F41.1

## 2013-12-19 NOTE — Assessment & Plan Note (Signed)
Mild, stable.  

## 2013-12-19 NOTE — Assessment & Plan Note (Signed)
miniize simple carbs. Increase exercise as tolerated.

## 2013-12-19 NOTE — Assessment & Plan Note (Signed)
I believe this is contributing to his HTN, he agrees to start Escitalopram 5 mg daily and to start Lorazepam 0.5 mg prn as well

## 2013-12-19 NOTE — Assessment & Plan Note (Signed)
Given rx for Jublia and encouraged to soak feet in Vinegar nightly.

## 2013-12-19 NOTE — Assessment & Plan Note (Signed)
Continues to have poor control, he agrees to referral to cardiology for further consideration.

## 2013-12-19 NOTE — Assessment & Plan Note (Signed)
Patient denies any difficulties at home. No trouble with ADLs, depression or falls. No recent changes to vision or hearing. Is UTD with immunizations. Is UTD with screening. Discussed Advanced Directives, patient agrees to bring us copies of documents if can. Encouraged heart healthy diet, exercise as tolerated and adequate sleep 

## 2013-12-19 NOTE — Assessment & Plan Note (Signed)
May try Melatonin 5- 10 mg prn

## 2014-01-01 ENCOUNTER — Ambulatory Visit (HOSPITAL_COMMUNITY): Payer: Medicare Other | Attending: Cardiovascular Disease | Admitting: Radiology

## 2014-01-01 DIAGNOSIS — I10 Essential (primary) hypertension: Secondary | ICD-10-CM | POA: Insufficient documentation

## 2014-01-01 DIAGNOSIS — R0609 Other forms of dyspnea: Secondary | ICD-10-CM | POA: Diagnosis not present

## 2014-01-01 DIAGNOSIS — R0602 Shortness of breath: Secondary | ICD-10-CM | POA: Diagnosis not present

## 2014-01-01 DIAGNOSIS — R0989 Other specified symptoms and signs involving the circulatory and respiratory systems: Secondary | ICD-10-CM | POA: Insufficient documentation

## 2014-01-01 DIAGNOSIS — R0600 Dyspnea, unspecified: Secondary | ICD-10-CM

## 2014-01-01 NOTE — Progress Notes (Signed)
Echocardiogram performed.  

## 2014-01-08 ENCOUNTER — Ambulatory Visit (INDEPENDENT_AMBULATORY_CARE_PROVIDER_SITE_OTHER): Payer: Medicare Other | Admitting: Cardiovascular Disease

## 2014-01-08 ENCOUNTER — Encounter: Payer: Self-pay | Admitting: Cardiovascular Disease

## 2014-01-08 VITALS — BP 162/78 | HR 61 | Ht 62.0 in | Wt 138.4 lb

## 2014-01-08 DIAGNOSIS — I10 Essential (primary) hypertension: Secondary | ICD-10-CM | POA: Diagnosis not present

## 2014-01-08 DIAGNOSIS — R0602 Shortness of breath: Secondary | ICD-10-CM | POA: Diagnosis not present

## 2014-01-08 MED ORDER — AMLODIPINE BESYLATE 5 MG PO TABS: 5.0000 mg | ORAL_TABLET | Freq: Every day | ORAL | Status: DC

## 2014-01-08 NOTE — Progress Notes (Signed)
PCP: Dr. Charlett Blake  HPI  This is an 80 year old male who was referred for evaluation of difficult to treat hypertension. He reports having hypertension for many years. However, he did not stop taking medications until about 3 years ago. His blood pressure has been difficult to control. He is currently on losartan and Bystolic. He had an allergic reaction to hydrochlorothiazide which caused a rash. He does not take any over-the-counter medications and denies use of nonsteroidal anti-inflammatory medications. He has no history of sleep apnea and does not have symptoms that are highly suggestive of this. He complains of exertional dyspnea if he goes uphill which has been noticeable recently. Again today this has been associated with vague chest discomfort. No orthopnea or PND. He is not a smoker and has no family history of premature coronary artery disease. Recent echo showed normal LVSF with moderate LVH. Recent labs were reviewed and overall unremarkable including normal electrolytes and thyroid function.   Allergies  Allergen Reactions  . Hctz [Hydrochlorothiazide] Rash  . Bactrim [Sulfamethoxazole-Trimethoprim] Rash     Current Outpatient Prescriptions on File Prior to Visit  Medication Sig Dispense Refill  . aspirin EC 81 MG tablet Take 1 tablet (81 mg total) by mouth daily.      Marland Kitchen escitalopram (LEXAPRO) 5 MG tablet Take 1 tablet (5 mg total) by mouth daily.  30 tablet  3  . LORazepam (ATIVAN) 0.5 MG tablet Take 1 tablet (0.5 mg total) by mouth 2 (two) times daily as needed for anxiety.  30 tablet  0  . losartan (COZAAR) 100 MG tablet Take 1 tablet (100 mg total) by mouth daily.  30 tablet  6  . nebivolol (BYSTOLIC) 10 MG tablet Take 1 tablet (10 mg total) by mouth daily.  30 tablet  2  . terazosin (HYTRIN) 1 MG capsule TAKE 1 CAPSULE BY MOUTH EVERY NIGHT AT BEDTIME  30 capsule  3   No current facility-administered medications on file prior to visit.     Past Medical History  Diagnosis  Date  . HTN (hypertension) 09/12/2011  . Urinary urgency 10/12/2011  . Elevated PSA 11/03/2011  . Thrombocytopenia 11/17/2011    slight  . Rash, skin 01/02/2012    Left lower leg  . Constipation 02/10/2012  . Intertrigo 03/20/2012  . Urinary hesitancy 10/12/2011  . Dermatitis 09/13/2012  . Uncontrolled hypertension 09/12/2011  . Medicare annual wellness visit, subsequent 09/12/2011  . Anxiety state, unspecified 12/19/2013     No past surgical history on file.   Family History  Problem Relation Age of Onset  . Migraines Daughter   . Allergies Daughter   . Allergies Son      History   Social History  . Marital Status: Divorced    Spouse Name: N/A    Number of Children: N/A  . Years of Education: N/A   Occupational History  . Not on file.   Social History Main Topics  . Smoking status: Never Smoker   . Smokeless tobacco: Never Used  . Alcohol Use: Yes     Comment: occasionally  . Drug Use: No  . Sexual Activity: No   Other Topics Concern  . Not on file   Social History Narrative  . No narrative on file     ROS A 10 point review of system was performed. It is negative other than that mentioned in the history of present illness.   PHYSICAL EXAM   BP 162/78  Pulse 61  Ht 5\' 2"  (  1.575 m)  Wt 138 lb 6.4 oz (62.778 kg)  BMI 25.31 kg/m2 Constitutional: He is oriented to person, place, and time. He appears well-developed and well-nourished. No distress.  HENT: No nasal discharge.  Head: Normocephalic and atraumatic.  Eyes: Pupils are equal and round.  No discharge. Neck: Normal range of motion. Neck supple. No JVD present. No thyromegaly present. No carotid bruit Cardiovascular: Normal rate, regular rhythm, normal heart sounds. Exam reveals no gallop and no friction rub. No murmur heard.  Pulmonary/Chest: Effort normal and breath sounds normal. No stridor. No respiratory distress. He has no wheezes. He has no rales. He exhibits no tenderness.  Abdominal: Soft. Bowel  sounds are normal. He exhibits no distension. There is no tenderness. There is no rebound and no guarding. No abdominal bruit.  Musculoskeletal: Normal range of motion. He exhibits no edema and no tenderness.  Neurological: He is alert and oriented to person, place, and time. Coordination normal.  Skin: Skin is warm and dry. No rash noted. He is not diaphoretic. No erythema. No pallor.  Psychiatric: He has a normal mood and affect. His behavior is normal. Judgment and thought content normal.       EKG: Normal sinus rhythm with first degree AV block and nonspecific T wave changes.   ASSESSMENT AND PLAN

## 2014-01-08 NOTE — Assessment & Plan Note (Signed)
The patient has uncontrolled hypertension. He is currently on 2 medications. Unfortunately, he is allergic to thiazide diuretics. He has no symptoms suggestive of sleep apnea and does not take NSAIDs. Hyperaldosteronism is unlikely in this age group especially with normal electrolytes. I think we should exclude renal artery stenosis as a culprit. Thus, I requested a renal artery duplex ultrasound. I added amlodipine 5 mg once daily. If blood pressure remains elevated, spironolactone can be considered Which can be useful in cases of refractory hypertension.

## 2014-01-08 NOTE — Patient Instructions (Signed)
Your physician has requested that you have a lexiscan myoview. For further information please visit [email or web address]. Please follow instruction sheet, as given.  Your physician has requested that you have a renal artery duplex. During this test, an ultrasound is used to evaluate blood flow to the kidneys. Allow one hour for this exam. Do not eat after midnight the day before and avoid carbonated beverages. Take your medications as you usually do.  Your physician has recommended you make the following change in your medication: START Amlodipine 5mg  take one by mouth daily  Your physician recommends that you schedule a follow-up appointment in: 1 MONTH with Dr Fletcher Anon

## 2014-01-08 NOTE — Assessment & Plan Note (Signed)
The patient has worsening exertional dyspnea with occasional chest discomfort. Recent echocardiogram was unremarkable except for moderate left ventricular hypertrophy. I requested a pharmacologic nuclear stress test given his age and risk factors for CAD.

## 2014-01-09 ENCOUNTER — Encounter: Payer: Self-pay | Admitting: Family Medicine

## 2014-01-09 NOTE — Telephone Encounter (Signed)
Appointment has already been cancelled in [place].  Do we need to schedule pt appointment for the future?

## 2014-01-11 ENCOUNTER — Ambulatory Visit (HOSPITAL_BASED_OUTPATIENT_CLINIC_OR_DEPARTMENT_OTHER): Payer: Medicare Other | Admitting: Cardiology

## 2014-01-11 ENCOUNTER — Ambulatory Visit: Payer: Medicare Other | Admitting: Family Medicine

## 2014-01-11 ENCOUNTER — Ambulatory Visit (HOSPITAL_COMMUNITY): Payer: Medicare Other | Attending: Cardiovascular Disease | Admitting: Radiology

## 2014-01-11 VITALS — BP 176/61 | HR 46 | Ht 62.0 in | Wt 143.0 lb

## 2014-01-11 DIAGNOSIS — R0989 Other specified symptoms and signs involving the circulatory and respiratory systems: Secondary | ICD-10-CM | POA: Diagnosis not present

## 2014-01-11 DIAGNOSIS — R0609 Other forms of dyspnea: Secondary | ICD-10-CM | POA: Diagnosis not present

## 2014-01-11 DIAGNOSIS — R0602 Shortness of breath: Secondary | ICD-10-CM | POA: Diagnosis not present

## 2014-01-11 DIAGNOSIS — R079 Chest pain, unspecified: Secondary | ICD-10-CM | POA: Insufficient documentation

## 2014-01-11 DIAGNOSIS — I10 Essential (primary) hypertension: Secondary | ICD-10-CM

## 2014-01-11 MED ORDER — TECHNETIUM TC 99M SESTAMIBI GENERIC - CARDIOLITE
33.0000 | Freq: Once | INTRAVENOUS | Status: AC | PRN
  Administered 2014-01-11: 33 via INTRAVENOUS

## 2014-01-11 MED ORDER — REGADENOSON 0.4 MG/5ML IV SOLN
0.4000 mg | Freq: Once | INTRAVENOUS | Status: AC
  Administered 2014-01-11: 0.4 mg via INTRAVENOUS

## 2014-01-11 MED ORDER — TECHNETIUM TC 99M SESTAMIBI GENERIC - CARDIOLITE
11.0000 | Freq: Once | INTRAVENOUS | Status: AC | PRN
  Administered 2014-01-11: 11 via INTRAVENOUS

## 2014-01-11 NOTE — Progress Notes (Signed)
Renal artery duplex complete

## 2014-01-11 NOTE — Progress Notes (Signed)
[facility] [address] [place], [place] [address] [phone]    Cardiology Nuclear Med Study  Steven Morrison is a 81 y.o. male     MRN : [id number]     DOB: March 14, 1928  Procedure Date: 01/11/2014  Nuclear Med Background Indication for Stress Test:  Evaluation for Ischemia and Abnormal EKG History:  no prior hx CAD; 12/2013 Echo-EF 55-60% Cardiac Risk Factors: Hypertension  Symptoms:  Chest Pain and DOE   Nuclear Pre-Procedure Caffeine/Decaff Intake:  None NPO After: 10:00pm   Lungs:  clear O2 Sat: 98% on room air. IV 0.9% NS with Angio Cath:  22g  IV Site: R Hand  IV Started by:  Annye Rusk, CNMT  Chest Size (in):  38 Cup Size: n/a  Height: 5\' 2"  (1.575 m)  Weight:  143 lb (64.864 kg)  BMI:  Body mass index is 26.15 kg/(m^2). Tech Comments:  Held bystolic 16SAY; took am meds    Nuclear Med Study 1 or 2 day study: 1 day  Stress Test Type:  Treadmill/Lexiscan  Reading MD: Mertie Moores, MD  Order Authorizing Provider:  Jerilynn Mages. Fletcher Anon, MD  Resting Radionuclide: Technetium 61m Sestamibi  Resting Radionuclide Dose: 11.0 mCi   Stress Radionuclide:  Technetium 76m Sestamibi  Stress Radionuclide Dose: 33.0 mCi           Stress Protocol Rest HR: 46 Stress HR: 94  Rest BP: 176/61 Stress BP: 127/54  Exercise Time (min): n/a METS: n/a           Dose of Adenosine (mg):  n/a Dose of Lexiscan: 0.4 mg  Dose of Atropine (mg): n/a Dose of Dobutamine: n/a mcg/kg/min (at max HR)  Stress Test Technologist: Glade Lloyd, BS-ES  Nuclear Technologist:  Charlton Amor, CNMT     Rest Procedure:  Myocardial perfusion imaging was performed at rest 45 minutes following the intravenous administration of Technetium 21m Sestamibi. Rest ECG: NSR - Normal EKG  Stress Procedure:  The patient received IV Lexiscan 0.4 mg over 15-seconds with concurrent low level exercise and then Technetium 39m Sestamibi was injected at 30-seconds while the patient continued  walking one more minute.  Quantitative spect images were obtained after a 45-minute delay. Stress ECG: No significant change from baseline ECG  QPS Raw Data Images:  Normal; no motion artifact; normal heart/lung ratio. Stress Images:  Normal homogeneous uptake in all areas of the myocardium. Rest Images:  Normal homogeneous uptake in all areas of the myocardium. Subtraction (SDS):  No evidence of ischemia. Transient Ischemic Dilatation (Normal <1.22):  0.99 Lung/Heart Ratio (Normal <0.45):  0.33  Quantitative Gated Spect Images QGS EDV:  72 ml QGS ESV:  24 ml  Impression Exercise Capacity:  Lexiscan with low level exercise. BP Response:  Normal blood pressure response. Clinical Symptoms:  No significant symptoms noted. ECG Impression:  No significant ST segment change suggestive of ischemia. Comparison with Prior Nuclear Study: No images to compare  Overall Impression:  Normal stress nuclear study.  No evidence of ischemia  LV Ejection Fraction: 66%.  LV Wall Motion:  NL LV Function; NL Wall Motion.   Thayer Headings, Brooke Bonito., MD, [facility] 01/11/2014, 4:27 PM [address]  [place] Pager [phone]

## 2014-01-14 ENCOUNTER — Encounter: Payer: Self-pay | Admitting: Family Medicine

## 2014-01-14 ENCOUNTER — Other Ambulatory Visit: Payer: Self-pay | Admitting: Family Medicine

## 2014-01-14 DIAGNOSIS — I10 Essential (primary) hypertension: Secondary | ICD-10-CM

## 2014-01-14 MED ORDER — LOSARTAN POTASSIUM 100 MG PO TABS: 100.0000 mg | ORAL_TABLET | Freq: Every day | ORAL | Status: DC

## 2014-01-14 NOTE — Telephone Encounter (Signed)
I sent the Losartan in.  Please advise the other question? Should he contact the cardiologist since that is who prescribed this?

## 2014-01-31 DIAGNOSIS — I10 Essential (primary) hypertension: Secondary | ICD-10-CM | POA: Diagnosis not present

## 2014-01-31 DIAGNOSIS — R002 Palpitations: Secondary | ICD-10-CM | POA: Diagnosis not present

## 2014-02-02 DIAGNOSIS — I369 Nonrheumatic tricuspid valve disorder, unspecified: Secondary | ICD-10-CM | POA: Diagnosis not present

## 2014-02-05 DIAGNOSIS — R002 Palpitations: Secondary | ICD-10-CM | POA: Diagnosis not present

## 2014-02-05 DIAGNOSIS — I10 Essential (primary) hypertension: Secondary | ICD-10-CM | POA: Diagnosis not present

## 2014-02-12 ENCOUNTER — Encounter: Payer: Self-pay | Admitting: Cardiovascular Disease

## 2014-02-12 ENCOUNTER — Ambulatory Visit (INDEPENDENT_AMBULATORY_CARE_PROVIDER_SITE_OTHER): Payer: Medicare Other | Admitting: Cardiovascular Disease

## 2014-02-12 VITALS — BP 168/78 | HR 65 | Ht 62.0 in | Wt 132.1 lb

## 2014-02-12 DIAGNOSIS — I10 Essential (primary) hypertension: Secondary | ICD-10-CM

## 2014-02-12 DIAGNOSIS — R0602 Shortness of breath: Secondary | ICD-10-CM | POA: Diagnosis not present

## 2014-02-12 NOTE — Progress Notes (Signed)
PCP: Dr. Charlett Blake  HPI  This is an 80 year old male who  is here today for a followup visit regarding refractory hypertension and exertional dyspnea. He reports having hypertension for many years. However, he did not start taking medications until about 3 years ago. His blood pressure has been difficult to control.  During her last visit, I added amlodipine 5 mg once daily. He underwent a nuclear stress test which showed no evidence of ischemia with normal ejection fraction. Renal artery duplex showed no evidence of renal artery stenosis. He is overall doing well. His daughter is with him today and he reports that he does not take his antihypertensive medications all the time especially if he is planning to drink alcohol.   Allergies  Allergen Reactions  . Hctz [Hydrochlorothiazide] Rash  . Bactrim [Sulfamethoxazole-Trimethoprim] Rash     Current Outpatient Prescriptions on File Prior to Visit  Medication Sig Dispense Refill  . amLODipine (NORVASC) 5 MG tablet Take 1 tablet (5 mg total) by mouth daily.  90 tablet  3  . aspirin EC 81 MG tablet Take 1 tablet (81 mg total) by mouth daily.      Marland Kitchen escitalopram (LEXAPRO) 5 MG tablet Take 1 tablet (5 mg total) by mouth daily.  30 tablet  3  . LORazepam (ATIVAN) 0.5 MG tablet Take 1 tablet (0.5 mg total) by mouth 2 (two) times daily as needed for anxiety.  30 tablet  0  . losartan (COZAAR) 100 MG tablet Take 1 tablet (100 mg total) by mouth daily.  30 tablet  6  . nebivolol (BYSTOLIC) 10 MG tablet Take 1 tablet (10 mg total) by mouth daily.  30 tablet  2  . terazosin (HYTRIN) 1 MG capsule TAKE 1 CAPSULE BY MOUTH EVERY NIGHT AT BEDTIME  30 capsule  3   No current facility-administered medications on file prior to visit.     Past Medical History  Diagnosis Date  . HTN (hypertension) 09/12/2011  . Urinary urgency 10/12/2011  . Elevated PSA 11/03/2011  . Thrombocytopenia 11/17/2011    slight  . Rash, skin 01/02/2012    Left lower leg  .  Constipation 02/10/2012  . Intertrigo 03/20/2012  . Urinary hesitancy 10/12/2011  . Dermatitis 09/13/2012  . Uncontrolled hypertension 09/12/2011  . Medicare annual wellness visit, subsequent 09/12/2011  . Anxiety state, unspecified 12/19/2013     No past surgical history on file.   Family History  Problem Relation Age of Onset  . Migraines Daughter   . Allergies Daughter   . Allergies Son      History   Social History  . Marital Status: Divorced    Spouse Name: N/A    Number of Children: N/A  . Years of Education: N/A   Occupational History  . Not on file.   Social History Main Topics  . Smoking status: Never Smoker   . Smokeless tobacco: Never Used  . Alcohol Use: Yes     Comment: occasionally  . Drug Use: No  . Sexual Activity: No   Other Topics Concern  . Not on file   Social History Narrative  . No narrative on file     ROS A 10 point review of system was performed. It is negative other than that mentioned in the history of present illness.   PHYSICAL EXAM   BP 168/78  Pulse 65  Ht 5\' 2"  (1.575 m)  Wt 132 lb 1.9 oz (59.929 kg)  BMI 24.16 kg/m2  SpO2 99%  Constitutional: He is oriented to person, place, and time. He appears well-developed and well-nourished. No distress.  HENT: No nasal discharge.  Head: Normocephalic and atraumatic.  Eyes: Pupils are equal and round.  No discharge. Neck: Normal range of motion. Neck supple. No JVD present. No thyromegaly present. No carotid bruit Cardiovascular: Normal rate, regular rhythm, normal heart sounds. Exam reveals no gallop and no friction rub. No murmur heard.  Pulmonary/Chest: Effort normal and breath sounds normal. No stridor. No respiratory distress. He has no wheezes. He has no rales. He exhibits no tenderness.  Abdominal: Soft. Bowel sounds are normal. He exhibits no distension. There is no tenderness. There is no rebound and no guarding. No abdominal bruit.  Musculoskeletal: Normal range of motion. He  exhibits no edema and no tenderness.  Neurological: He is alert and oriented to person, place, and time. Coordination normal.  Skin: Skin is warm and dry. No rash noted. He is not diaphoretic. No erythema. No pallor.  Psychiatric: He has a normal mood and affect. His behavior is normal. Judgment and thought content normal.       ASSESSMENT AND PLAN

## 2014-02-12 NOTE — Patient Instructions (Signed)
Continue same medications.  Follow up in 3 months.  

## 2014-02-12 NOTE — Assessment & Plan Note (Signed)
Recent stress test was normal. No further cardiac workup is recommended.

## 2014-02-12 NOTE — Assessment & Plan Note (Signed)
Blood pressure is still elevated. However, it appears that he has not been consistent taking his medications as prescribed. I discussed with him the importance of compliance. I suggested that we continue the same medications for now and reevaluate him in 3 months. If blood pressure remains elevated, I plan on adding spironolactone.

## 2014-05-07 ENCOUNTER — Ambulatory Visit (INDEPENDENT_AMBULATORY_CARE_PROVIDER_SITE_OTHER): Payer: Medicare Other | Admitting: Cardiovascular Disease

## 2014-05-07 ENCOUNTER — Encounter: Payer: Self-pay | Admitting: Cardiovascular Disease

## 2014-05-07 VITALS — BP 160/68 | HR 90 | Ht 62.0 in | Wt 130.8 lb

## 2014-05-07 DIAGNOSIS — I10 Essential (primary) hypertension: Secondary | ICD-10-CM | POA: Diagnosis not present

## 2014-05-07 DIAGNOSIS — Z79899 Other long term (current) drug therapy: Secondary | ICD-10-CM

## 2014-05-07 MED ORDER — SPIRONOLACTONE 25 MG PO TABS: 25.0000 mg | ORAL_TABLET | Freq: Every day | ORAL | Status: DC

## 2014-05-07 NOTE — Progress Notes (Signed)
PCP: Dr. Charlett Blake  HPI  This is an 79 year old male who  is here today for a followup visit regarding refractory hypertension and exertional dyspnea. He reports having hypertension for many years. However, he did not start taking medications until about 3 years ago. His blood pressure has been difficult to control.  A nuclear stress test in 12/2013 showed no evidence of ischemia with normal ejection fraction. Renal artery duplex showed no evidence of renal artery stenosis. There has been issues with compliance but that has gradually improved. Blood pressure improved but still not optimal.    Allergies  Allergen Reactions  . Hctz [Hydrochlorothiazide] Rash  . Bactrim [Sulfamethoxazole-Trimethoprim] Rash     Current Outpatient Prescriptions on File Prior to Visit  Medication Sig Dispense Refill  . amLODipine (NORVASC) 5 MG tablet Take 1 tablet (5 mg total) by mouth daily.  90 tablet  3  . aspirin EC 81 MG tablet Take 1 tablet (81 mg total) by mouth daily.      Marland Kitchen escitalopram (LEXAPRO) 5 MG tablet Take 1 tablet (5 mg total) by mouth daily.  30 tablet  3  . LORazepam (ATIVAN) 0.5 MG tablet Take 1 tablet (0.5 mg total) by mouth 2 (two) times daily as needed for anxiety.  30 tablet  0  . losartan (COZAAR) 100 MG tablet Take 1 tablet (100 mg total) by mouth daily.  30 tablet  6  . nebivolol (BYSTOLIC) 10 MG tablet Take 1 tablet (10 mg total) by mouth daily.  30 tablet  2  . terazosin (HYTRIN) 1 MG capsule TAKE 1 CAPSULE BY MOUTH EVERY NIGHT AT BEDTIME  30 capsule  3   No current facility-administered medications on file prior to visit.     Past Medical History  Diagnosis Date  . HTN (hypertension) 09/12/2011  . Urinary urgency 10/12/2011  . Elevated PSA 11/03/2011  . Thrombocytopenia 11/17/2011    slight  . Rash, skin 01/02/2012    Left lower leg  . Constipation 02/10/2012  . Intertrigo 03/20/2012  . Urinary hesitancy 10/12/2011  . Dermatitis 09/13/2012  . Uncontrolled hypertension 09/12/2011   . Medicare annual wellness visit, subsequent 09/12/2011  . Anxiety state, unspecified 12/19/2013     No past surgical history on file.   Family History  Problem Relation Age of Onset  . Migraines Daughter   . Allergies Daughter   . Allergies Son      History   Social History  . Marital Status: Divorced    Spouse Name: N/A    Number of Children: N/A  . Years of Education: N/A   Occupational History  . Not on file.   Social History Main Topics  . Smoking status: Never Smoker   . Smokeless tobacco: Never Used  . Alcohol Use: Yes     Comment: occasionally  . Drug Use: No  . Sexual Activity: No   Other Topics Concern  . Not on file   Social History Narrative  . No narrative on file     ROS A 10 point review of system was performed. It is negative other than that mentioned in the history of present illness.   PHYSICAL EXAM   BP 160/68  Pulse 90  Ht 5\' 2"  (1.575 m)  Wt 130 lb 12.8 oz (59.33 kg)  BMI 23.92 kg/m2 Constitutional: He is oriented to person, place, and time. He appears well-developed and well-nourished. No distress.  HENT: No nasal discharge.  Head: Normocephalic and atraumatic.  Eyes: Pupils  are equal and round.  No discharge. Neck: Normal range of motion. Neck supple. No JVD present. No thyromegaly present. No carotid bruit Cardiovascular: Normal rate, regular rhythm, normal heart sounds. Exam reveals no gallop and no friction rub. No murmur heard.  Pulmonary/Chest: Effort normal and breath sounds normal. No stridor. No respiratory distress. He has no wheezes. He has no rales. He exhibits no tenderness.  Abdominal: Soft. Bowel sounds are normal. He exhibits no distension. There is no tenderness. There is no rebound and no guarding. No abdominal bruit.  Musculoskeletal: Normal range of motion. He exhibits no edema and no tenderness.  Neurological: He is alert and oriented to person, place, and time. Coordination normal.  Skin: Skin is warm and dry.  No rash noted. He is not diaphoretic. No erythema. No pallor.  Psychiatric: He has a normal mood and affect. His behavior is normal. Judgment and thought content normal.       ASSESSMENT AND PLAN

## 2014-05-07 NOTE — Patient Instructions (Addendum)
Your physician recommends that you schedule a follow-up appointment in:   [place] has recommended you make the following change in your medication:  START  SPIRINOLACTONE  [address] Your physician recommends that you return for lab work in:  [place]    [place]

## 2014-05-07 NOTE — Assessment & Plan Note (Signed)
Blood pressure improved but is still not at target. He is allergic to thiazide diuretics. He reports that he takes terazosin for hypertension and BPH. This does not have very good antihypertensive effect. Thus, I discontinued this medication and started spironolactone 25 mg once daily. Check basic metabolic profile in one week. Continue other medications. I discussed with him the importance of taking medications regularly and cutting down on alcohol intake.

## 2014-05-14 ENCOUNTER — Other Ambulatory Visit (INDEPENDENT_AMBULATORY_CARE_PROVIDER_SITE_OTHER): Payer: Medicare Other | Admitting: *Deleted

## 2014-05-14 DIAGNOSIS — Z79899 Other long term (current) drug therapy: Secondary | ICD-10-CM

## 2014-05-14 LAB — BASIC METABOLIC PANEL
BUN: 13 mg/dL (ref 6–23)
CHLORIDE: 104 meq/L (ref 96–112)
CO2: 29 meq/L (ref 19–32)
CREATININE: 1.2 mg/dL (ref 0.4–1.5)
Calcium: 9.6 mg/dL (ref 8.4–10.5)
GFR: 59.01 mL/min — ABNORMAL LOW (ref >60.00)
GLUCOSE: 103 mg/dL — AB (ref 70–99)
POTASSIUM: 4 meq/L (ref 3.5–5.1)
Sodium: 137 mEq/L (ref 135–145)

## 2014-07-23 ENCOUNTER — Ambulatory Visit (INDEPENDENT_AMBULATORY_CARE_PROVIDER_SITE_OTHER): Payer: Medicare Other | Admitting: Cardiovascular Disease

## 2014-07-23 ENCOUNTER — Encounter: Payer: Self-pay | Admitting: Cardiovascular Disease

## 2014-07-23 VITALS — BP 170/82 | HR 75 | Ht 62.0 in | Wt 131.4 lb

## 2014-07-23 DIAGNOSIS — I10 Essential (primary) hypertension: Secondary | ICD-10-CM

## 2014-07-23 LAB — BASIC METABOLIC PANEL
BUN: 17 mg/dL (ref 6–23)
CO2: 26 mEq/L (ref 19–32)
Calcium: 9.7 mg/dL (ref 8.4–10.5)
Chloride: 98 mEq/L (ref 96–112)
Creatinine, Ser: 1.2 mg/dL (ref 0.4–1.5)
GFR: 60.1 mL/min (ref >60.00)
Glucose, Bld: 106 mg/dL — ABNORMAL HIGH (ref 70–99)
Potassium: 4.3 mEq/L (ref 3.5–5.1)
SODIUM: 133 meq/L — AB (ref 135–145)

## 2014-07-23 MED ORDER — TERAZOSIN HCL 1 MG PO CAPS: 1.0000 mg | ORAL_CAPSULE | Freq: Every day | ORAL | Status: DC

## 2014-07-23 MED ORDER — NEBIVOLOL HCL 10 MG PO TABS: 10.0000 mg | ORAL_TABLET | Freq: Every day | ORAL | Status: DC

## 2014-07-23 NOTE — Progress Notes (Signed)
PCP: Dr. Charlett Blake  HPI  This is an 80 year old male who  is here today for a followup visit regarding refractory hypertension and exertional dyspnea. He reports having hypertension for many years. However, he did not start taking medications until about 3 years ago. His blood pressure has been difficult to control.  A nuclear stress test in 12/2013 showed no evidence of ischemia with normal ejection fraction. Renal artery duplex showed no evidence of renal artery stenosis. There has been issues with compliance but that has gradually improved. Blood pressure improved but still not optimal. During last visit, I discontinued Terazosin and started Spironolactone 25 mg once daily. He ran out of Bystolic and amlodipine and has not been taking them. He also reports some difficulty voiding after to Terazosin was discontinued .    Allergies  Allergen Reactions  . Hctz [Hydrochlorothiazide] Rash  . Bactrim [Sulfamethoxazole-Trimethoprim] Rash     Current Outpatient Prescriptions on File Prior to Visit  Medication Sig Dispense Refill  . aspirin EC 81 MG tablet Take 1 tablet (81 mg total) by mouth daily.    Marland Kitchen escitalopram (LEXAPRO) 5 MG tablet Take 1 tablet (5 mg total) by mouth daily. 30 tablet 3  . LORazepam (ATIVAN) 0.5 MG tablet Take 1 tablet (0.5 mg total) by mouth 2 (two) times daily as needed for anxiety. 30 tablet 0  . losartan (COZAAR) 100 MG tablet Take 1 tablet (100 mg total) by mouth daily. 30 tablet 6  . spironolactone (ALDACTONE) 25 MG tablet Take 1 tablet (25 mg total) by mouth daily. 90 tablet 3   No current facility-administered medications on file prior to visit.     Past Medical History  Diagnosis Date  . HTN (hypertension) 09/12/2011  . Urinary urgency 10/12/2011  . Elevated PSA 11/03/2011  . Thrombocytopenia 11/17/2011    slight  . Rash, skin 01/02/2012    Left lower leg  . Constipation 02/10/2012  . Intertrigo 03/20/2012  . Urinary hesitancy 10/12/2011  . Dermatitis 09/13/2012    . Uncontrolled hypertension 09/12/2011  . Medicare annual wellness visit, subsequent 09/12/2011  . Anxiety state, unspecified 12/19/2013     No past surgical history on file.   Family History  Problem Relation Age of Onset  . Migraines Daughter   . Allergies Daughter   . Allergies Son      History   Social History  . Marital Status: Divorced    Spouse Name: N/A    Number of Children: N/A  . Years of Education: N/A   Occupational History  . Not on file.   Social History Main Topics  . Smoking status: Never Smoker   . Smokeless tobacco: Never Used  . Alcohol Use: Yes     Comment: occasionally  . Drug Use: No  . Sexual Activity: No   Other Topics Concern  . Not on file   Social History Narrative     ROS A 10 point review of system was performed. It is negative other than that mentioned in the history of present illness.   PHYSICAL EXAM   BP 170/82 mmHg  Pulse 75  Ht 5\' 2"  (1.575 m)  Wt 131 lb 6.4 oz (59.603 kg)  BMI 24.03 kg/m2 Constitutional: He is oriented to person, place, and time. He appears well-developed and well-nourished. No distress.  HENT: No nasal discharge.  Head: Normocephalic and atraumatic.  Eyes: Pupils are equal and round.  No discharge. Neck: Normal range of motion. Neck supple. No JVD present. No  thyromegaly present. No carotid bruit Cardiovascular: Normal rate, regular rhythm, normal heart sounds. Exam reveals no gallop and no friction rub. No murmur heard.  Pulmonary/Chest: Effort normal and breath sounds normal. No stridor. No respiratory distress. He has no wheezes. He has no rales. He exhibits no tenderness.  Abdominal: Soft. Bowel sounds are normal. He exhibits no distension. There is no tenderness. There is no rebound and no guarding. No abdominal bruit.  Musculoskeletal: Normal range of motion. He exhibits no edema and no tenderness.  Neurological: He is alert and oriented to person, place, and time. Coordination normal.  Skin:  Skin is warm and dry. No rash noted. He is not diaphoretic. No erythema. No pallor.  Psychiatric: He has a normal mood and affect. His behavior is normal. Judgment and thought content normal.       ASSESSMENT AND PLAN

## 2014-07-23 NOTE — Patient Instructions (Signed)
Your physician has recommended you make the following change in your medication:  START Terazosin 1mg  take one by mouth at bedtime  Your physician recommends that you have lab work today: [facility]  Your physician recommends that you schedule a follow-up appointment in: [place] with Dr Fletcher Anon

## 2014-07-25 NOTE — Assessment & Plan Note (Signed)
The patient continues to have compliance issues and ran out of 2 of his antihypertensive medications. I had a prolonged discussion with him about the importance of taking his medications regularly and not stopping any medication without discussing with [place].  It appears that he is now on hydrochlorothiazide and seems to be tolerating it. I requested basic metabolic profile given that he is also on spironolactone. I resumed Terazosin given that difficulty voiding. I also refilled Bystolic.

## 2014-09-26 ENCOUNTER — Other Ambulatory Visit: Payer: Self-pay | Admitting: Family Medicine

## 2014-09-26 ENCOUNTER — Encounter: Payer: Self-pay | Admitting: Family Medicine

## 2014-09-26 DIAGNOSIS — I10 Essential (primary) hypertension: Secondary | ICD-10-CM

## 2014-09-26 MED ORDER — LOSARTAN POTASSIUM 100 MG PO TABS: 100.0000 mg | ORAL_TABLET | Freq: Every day | ORAL | Status: DC

## 2014-10-22 ENCOUNTER — Encounter: Payer: Self-pay | Admitting: Cardiovascular Disease

## 2014-10-22 ENCOUNTER — Ambulatory Visit (INDEPENDENT_AMBULATORY_CARE_PROVIDER_SITE_OTHER): Payer: Medicare Other | Admitting: Cardiovascular Disease

## 2014-10-22 VITALS — BP 140/56 | HR 59 | Ht 62.0 in | Wt 134.4 lb

## 2014-10-22 DIAGNOSIS — I10 Essential (primary) hypertension: Secondary | ICD-10-CM | POA: Diagnosis not present

## 2014-10-22 NOTE — Patient Instructions (Signed)
Your physician wants you to follow-up in: 6 months with Dr. Arida You will receive a reminder letter in the mail two months in advance. If you don't receive a letter, please call our office to schedule the follow-up appointment.  

## 2014-10-22 NOTE — Progress Notes (Signed)
PCP: Dr. Charlett Blake  HPI  This is an 81 year old male who  is here today for a followup visit regarding refractory hypertension and exertional dyspnea. He reports having hypertension for many years. However, he did not start taking medications until about 3 years ago. His blood pressure has been difficult to control.  A nuclear stress test in 12/2013 showed no evidence of ischemia with normal ejection fraction. Renal artery duplex showed no evidence of renal artery stenosis. There has been issues with compliance but that has gradually improved. He does not seem to be following instructions. However, at least he is taking losartan, spironolactone and Bystolic. Blood pressure has been reasonably controlled on these 3 medications.   Allergies  Allergen Reactions  . Hctz [Hydrochlorothiazide] Rash  . Bactrim [Sulfamethoxazole-Trimethoprim] Rash     Current Outpatient Prescriptions on File Prior to Visit  Medication Sig Dispense Refill  . aspirin EC 81 MG tablet Take 1 tablet (81 mg total) by mouth daily.    Marland Kitchen escitalopram (LEXAPRO) 5 MG tablet Take 1 tablet (5 mg total) by mouth daily. 30 tablet 3  . hydrochlorothiazide (HYDRODIURIL) 25 MG tablet   3  . LORazepam (ATIVAN) 0.5 MG tablet Take 1 tablet (0.5 mg total) by mouth 2 (two) times daily as needed for anxiety. 30 tablet 0  . losartan (COZAAR) 100 MG tablet Take 1 tablet (100 mg total) by mouth daily. 30 tablet 6  . nebivolol (BYSTOLIC) 10 MG tablet Take 1 tablet (10 mg total) by mouth daily. 30 tablet 5  . spironolactone (ALDACTONE) 25 MG tablet Take 1 tablet (25 mg total) by mouth daily. 90 tablet 3  . terazosin (HYTRIN) 1 MG capsule Take 1 capsule (1 mg total) by mouth at bedtime. (Patient not taking: Reported on 10/22/2014) 30 capsule 5   No current facility-administered medications on file prior to visit.     Past Medical History  Diagnosis Date  . HTN (hypertension) 09/12/2011  . Urinary urgency 10/12/2011  . Elevated PSA 11/03/2011    . Thrombocytopenia 11/17/2011    slight  . Rash, skin 01/02/2012    Left lower leg  . Constipation 02/10/2012  . Intertrigo 03/20/2012  . Urinary hesitancy 10/12/2011  . Dermatitis 09/13/2012  . Uncontrolled hypertension 09/12/2011  . Medicare annual wellness visit, subsequent 09/12/2011  . Anxiety state, unspecified 12/19/2013     History reviewed. No pertinent past surgical history.   Family History  Problem Relation Age of Onset  . Migraines Daughter   . Allergies Daughter   . Allergies Son      History   Social History  . Marital Status: Divorced    Spouse Name: N/A  . Number of Children: N/A  . Years of Education: N/A   Occupational History  . Not on file.   Social History Main Topics  . Smoking status: Never Smoker   . Smokeless tobacco: Never Used  . Alcohol Use: Yes     Comment: occasionally  . Drug Use: No  . Sexual Activity: No   Other Topics Concern  . Not on file   Social History Narrative     ROS A 10 point review of system was performed. It is negative other than that mentioned in the history of present illness.   PHYSICAL EXAM   BP 140/56 mmHg  Pulse 59  Ht 5\' 2"  (1.575 m)  Wt 134 lb 6.4 oz (60.963 kg)  BMI 24.58 kg/m2 Constitutional: He is oriented to person, place, and time. He  appears well-developed and well-nourished. No distress.  HENT: No nasal discharge.  Head: Normocephalic and atraumatic.  Eyes: Pupils are equal and round.  No discharge. Neck: Normal range of motion. Neck supple. No JVD present. No thyromegaly present. No carotid bruit Cardiovascular: Normal rate, regular rhythm, normal heart sounds. Exam reveals no gallop and no friction rub. No murmur heard.  Pulmonary/Chest: Effort normal and breath sounds normal. No stridor. No respiratory distress. He has no wheezes. He has no rales. He exhibits no tenderness.  Abdominal: Soft. Bowel sounds are normal. He exhibits no distension. There is no tenderness. There is no rebound and no  guarding. No abdominal bruit.  Musculoskeletal: Normal range of motion. He exhibits no edema and no tenderness.  Neurological: He is alert and oriented to person, place, and time. Coordination normal.  Skin: Skin is warm and dry. No rash noted. He is not diaphoretic. No erythema. No pallor.  Psychiatric: He has a normal mood and affect. His behavior is normal. Judgment and thought content normal.       ASSESSMENT AND PLAN

## 2014-10-27 ENCOUNTER — Encounter: Payer: Self-pay | Admitting: Cardiovascular Disease

## 2014-10-27 NOTE — Assessment & Plan Note (Signed)
Blood pressure is now reasonably controlled on these current 3 medications. I had a discussion with him about the importance of taking medications as prescribed. I explained to him that he is going to stay on these medications long-term.

## 2015-03-24 ENCOUNTER — Encounter: Payer: Self-pay | Admitting: Family Medicine

## 2015-03-25 ENCOUNTER — Other Ambulatory Visit: Payer: Self-pay | Admitting: Family Medicine

## 2015-03-25 DIAGNOSIS — I10 Essential (primary) hypertension: Secondary | ICD-10-CM

## 2015-03-25 MED ORDER — NEBIVOLOL HCL 10 MG PO TABS: 10.0000 mg | ORAL_TABLET | Freq: Every day | ORAL | Status: DC

## 2015-04-17 ENCOUNTER — Encounter: Payer: Self-pay | Admitting: Family Medicine

## 2015-04-17 ENCOUNTER — Ambulatory Visit (INDEPENDENT_AMBULATORY_CARE_PROVIDER_SITE_OTHER): Payer: Medicare Other | Admitting: Family Medicine

## 2015-04-17 VITALS — BP 162/82 | HR 56 | Temp 98.1°F | Ht 62.0 in | Wt 129.5 lb

## 2015-04-17 DIAGNOSIS — E785 Hyperlipidemia, unspecified: Secondary | ICD-10-CM

## 2015-04-17 DIAGNOSIS — I10 Essential (primary) hypertension: Secondary | ICD-10-CM | POA: Diagnosis not present

## 2015-04-17 DIAGNOSIS — R739 Hyperglycemia, unspecified: Secondary | ICD-10-CM

## 2015-04-17 DIAGNOSIS — G4700 Insomnia, unspecified: Secondary | ICD-10-CM

## 2015-04-17 DIAGNOSIS — D696 Thrombocytopenia, unspecified: Secondary | ICD-10-CM

## 2015-04-17 DIAGNOSIS — F411 Generalized anxiety disorder: Secondary | ICD-10-CM

## 2015-04-17 HISTORY — DX: Hyperlipidemia, unspecified: E78.5

## 2015-04-17 MED ORDER — LOSARTAN POTASSIUM 100 MG PO TABS: 50.0000 mg | ORAL_TABLET | Freq: Two times a day (BID) | ORAL | Status: DC

## 2015-04-17 MED ORDER — NEBIVOLOL HCL 10 MG PO TABS: 5.0000 mg | ORAL_TABLET | Freq: Two times a day (BID) | ORAL | Status: DC

## 2015-04-17 NOTE — Patient Instructions (Signed)

## 2015-04-17 NOTE — Progress Notes (Signed)
Pre visit review using our clinic review tool, if applicable. No additional management support is needed unless otherwise documented below in the visit note. 

## 2015-04-17 NOTE — Progress Notes (Signed)
Subjective:    Patient ID: Steven Morrison, male    DOB: 1927-11-21, 81 y.o.   MRN: [id number]  Chief Complaint  Patient presents with  . Follow-up    HPI Patient is in today for follow-up. Overall feels well. Has just gotten back from some traveling and on his trip 14 was sleeping better and his blood pressure was improved. Now that he is back on his sleep is been interrupted again and his blood pressure has started to decline but he feels well. No recent illness. Denies any fevers or chills. Denies CP/palp/SOB/HA/congestion/fevers/GI or GU c/o. Taking meds as prescribed  Past Medical History  Diagnosis Date  . HTN (hypertension) 09/12/2011  . Urinary urgency 10/12/2011  . Elevated PSA 11/03/2011  . Thrombocytopenia 11/17/2011    slight  . Rash, skin 01/02/2012    Left lower leg  . Constipation 02/10/2012  . Intertrigo 03/20/2012  . Urinary hesitancy 10/12/2011  . Dermatitis 09/13/2012  . Uncontrolled hypertension 09/12/2011  . Medicare annual wellness visit, subsequent 09/12/2011  . Anxiety state, unspecified 12/19/2013    History reviewed. No pertinent past surgical history.  Family History  Problem Relation Age of Onset  . Migraines Daughter   . Allergies Daughter   . Allergies Son     Social History   Social History  . Marital Status: Divorced    Spouse Name: N/A  . Number of Children: N/A  . Years of Education: N/A   Occupational History  . Not on file.   Social History Main Topics  . Smoking status: Never Smoker   . Smokeless tobacco: Never Used  . Alcohol Use: Yes     Comment: occasionally  . Drug Use: No  . Sexual Activity: No   Other Topics Concern  . Not on file   Social History Narrative    Outpatient Prescriptions Prior to Visit  Medication Sig Dispense Refill  . escitalopram (LEXAPRO) 5 MG tablet Take 1 tablet (5 mg total) by mouth daily. 30 tablet 3  . losartan (COZAAR) 100 MG tablet Take 1 tablet (100 mg total) by mouth daily. 30 tablet 6  .  nebivolol (BYSTOLIC) 10 MG tablet Take 1 tablet (10 mg total) by mouth daily. 30 tablet 1  . aspirin EC 81 MG tablet Take 1 tablet (81 mg total) by mouth daily. (Patient not taking: Reported on 04/17/2015)    . hydrochlorothiazide (HYDRODIURIL) 25 MG tablet   3  . LORazepam (ATIVAN) 0.5 MG tablet Take 1 tablet (0.5 mg total) by mouth 2 (two) times daily as needed for anxiety. (Patient not taking: Reported on 04/17/2015) 30 tablet 0  . spironolactone (ALDACTONE) 25 MG tablet Take 1 tablet (25 mg total) by mouth daily. (Patient not taking: Reported on 04/17/2015) 90 tablet 3  . terazosin (HYTRIN) 1 MG capsule Take 1 capsule (1 mg total) by mouth at bedtime. (Patient not taking: Reported on 10/22/2014) 30 capsule 5   No facility-administered medications prior to visit.    Allergies  Allergen Reactions  . Hctz [Hydrochlorothiazide] Rash  . Bactrim [Sulfamethoxazole-Trimethoprim] Rash    Review of Systems  Constitutional: Negative for fever and malaise/fatigue.  HENT: Negative for congestion.   Eyes: Negative for discharge.  Respiratory: Negative for shortness of breath.   Cardiovascular: Negative for chest pain, palpitations and leg swelling.  Gastrointestinal: Negative for nausea and abdominal pain.  Genitourinary: Negative for dysuria.  Musculoskeletal: Negative for falls.  Skin: Negative for rash.  Neurological: Negative for loss of consciousness and  headaches.  Endo/Heme/Allergies: Negative for environmental allergies.  Psychiatric/Behavioral: Negative for depression. The patient is nervous/anxious and has insomnia.        Objective:    Physical Exam  Constitutional: He is oriented to person, place, and time. He appears well-developed and well-nourished. No distress.  HENT:  Head: Normocephalic and atraumatic.  Nose: Nose normal.  Eyes: Right eye exhibits no discharge. Left eye exhibits no discharge.  Neck: Normal range of motion. Neck supple.  Cardiovascular: Normal rate and regular  rhythm.   Pulmonary/Chest: Effort normal and breath sounds normal.  Abdominal: Soft. Bowel sounds are normal. There is no tenderness.  Musculoskeletal: He exhibits no edema.  Neurological: He is alert and oriented to person, place, and time.  Skin: Skin is warm and dry.  Psychiatric: He has a normal mood and affect.  Nursing note and vitals reviewed.   BP 162/82 mmHg  Pulse 56  Temp(Src) 98.1 F (36.7 C) (Oral)  Ht 5\' 2"  (1.575 m)  Wt 129 lb 8 oz (58.741 kg)  BMI 23.68 kg/m2  SpO2 97% Wt Readings from Last 3 Encounters:  04/17/15 129 lb 8 oz (58.741 kg)  10/22/14 134 lb 6.4 oz (60.963 kg)  07/23/14 131 lb 6.4 oz (59.603 kg)     Lab Results  Component Value Date   WBC 4.5 12/17/2013   HGB 13.8 12/17/2013   HCT 40.5 12/17/2013   PLT 141* 12/17/2013   GLUCOSE 106* 07/23/2014   CHOL 148 12/17/2013   TRIG 122 12/17/2013   HDL 34* 12/17/2013   LDLCALC 90 12/17/2013   ALT 17 12/17/2013   AST 16 12/17/2013   NA 133* 07/23/2014   K 4.3 07/23/2014   CL 98 07/23/2014   CREATININE 1.2 07/23/2014   BUN 17 07/23/2014   CO2 26 07/23/2014   TSH 1.877 12/17/2013   PSA 5.70* 10/12/2011   HGBA1C 5.9* 12/17/2013    Lab Results  Component Value Date   TSH 1.877 12/17/2013   Lab Results  Component Value Date   WBC 4.5 12/17/2013   HGB 13.8 12/17/2013   HCT 40.5 12/17/2013   MCV 78.5 12/17/2013   PLT 141* 12/17/2013   Lab Results  Component Value Date   NA 133* 07/23/2014   K 4.3 07/23/2014   CO2 26 07/23/2014   GLUCOSE 106* 07/23/2014   BUN 17 07/23/2014   CREATININE 1.2 07/23/2014   BILITOT 0.6 12/17/2013   ALKPHOS 51 12/17/2013   AST 16 12/17/2013   ALT 17 12/17/2013   PROT 6.8 12/17/2013   ALBUMIN 4.3 12/17/2013   ALBUMIN 4.3 12/17/2013   CALCIUM 9.7 07/23/2014   GFR 60.10 07/23/2014   Lab Results  Component Value Date   CHOL 148 12/17/2013   Lab Results  Component Value Date   HDL 34* 12/17/2013   Lab Results  Component Value Date   LDLCALC 90  12/17/2013   Lab Results  Component Value Date   TRIG 122 12/17/2013   Lab Results  Component Value Date   CHOLHDL 4.4 12/17/2013   Lab Results  Component Value Date   HGBA1C 5.9* 12/17/2013       Assessment & Plan:  Hyperlipidemia, mild Encouraged heart healthy diet, increase exercise, avoid trans fats, consider a krill oil cap daily  Hyperglycemia minimize simple carbs. Increase exercise as tolerated.   Uncontrolled hypertension Encouraged Losartan and Bystolic bid. Minimize sodium and caffeine  Anxiety state Doing better with low dose Lexapro  Insomnia Encouraged good sleep hygiene such as dark,  quiet room. No blue/green glowing lights such as computer screens in bedroom. No alcohol or stimulants in evening. Cut down on caffeine as able. Regular exercise is helpful but not just prior to bed time.     I am having Mr. Solanki maintain his aspirin EC, escitalopram, LORazepam, spironolactone, hydrochlorothiazide, terazosin, losartan, and nebivolol.  No orders of the defined types were placed in this encounter.     [name], MD.von

## 2015-04-18 LAB — COMPREHENSIVE METABOLIC PANEL
ALT: 13 U/L (ref 0–53)
AST: 18 U/L (ref 0–37)
Albumin: 4.2 g/dL (ref 3.5–5.2)
Alkaline Phosphatase: 42 U/L (ref 39–117)
BILIRUBIN TOTAL: 0.5 mg/dL (ref 0.2–1.2)
BUN: 17 mg/dL (ref 6–23)
CO2: 28 meq/L (ref 19–32)
Calcium: 9.1 mg/dL (ref 8.4–10.5)
Chloride: 105 mEq/L (ref 96–112)
Creatinine, Ser: 1.22 mg/dL (ref 0.40–1.50)
GFR: 59.99 mL/min — ABNORMAL LOW (ref >60.00)
GLUCOSE: 71 mg/dL (ref 70–99)
Potassium: 4.5 mEq/L (ref 3.5–5.1)
Sodium: 140 mEq/L (ref 135–145)
Total Protein: 7.4 g/dL (ref 6.0–8.3)

## 2015-04-18 LAB — LIPID PANEL
CHOL/HDL RATIO: 4
Cholesterol: 159 mg/dL (ref 0–200)
HDL: 36.7 mg/dL — ABNORMAL LOW (ref >39.00)
LDL Cholesterol: 98 mg/dL (ref 0–99)
NONHDL: 122.42
Triglycerides: 121 mg/dL (ref 0.0–149.0)
VLDL: 24.2 mg/dL (ref 0.0–40.0)

## 2015-04-18 LAB — CBC
HCT: 42 % (ref 39.0–52.0)
HEMOGLOBIN: 13.8 g/dL (ref 13.0–17.0)
MCHC: 32.8 g/dL (ref 30.0–36.0)
MCV: 84.4 fl (ref 78.0–100.0)
Platelets: 158 10*3/uL (ref 150.0–400.0)
RBC: 4.97 Mil/uL (ref 4.22–5.81)
RDW: 14.2 % (ref 11.5–15.5)
WBC: 6 10*3/uL (ref 4.0–10.5)

## 2015-04-18 LAB — HEMOGLOBIN A1C: HEMOGLOBIN A1C: 5.8 % (ref 4.6–6.5)

## 2015-04-18 LAB — TSH: TSH: 2.89 u[IU]/mL (ref 0.35–4.50)

## 2015-04-30 ENCOUNTER — Encounter: Payer: Self-pay | Admitting: Cardiovascular Disease

## 2015-05-02 ENCOUNTER — Other Ambulatory Visit: Payer: Self-pay

## 2015-05-02 ENCOUNTER — Encounter: Payer: Self-pay | Admitting: Family Medicine

## 2015-05-02 DIAGNOSIS — F329 Major depressive disorder, single episode, unspecified: Secondary | ICD-10-CM

## 2015-05-02 DIAGNOSIS — I10 Essential (primary) hypertension: Secondary | ICD-10-CM

## 2015-05-02 DIAGNOSIS — F419 Anxiety disorder, unspecified: Principal | ICD-10-CM

## 2015-05-02 MED ORDER — ESCITALOPRAM OXALATE 5 MG PO TABS: 5.0000 mg | ORAL_TABLET | Freq: Every day | ORAL | Status: DC

## 2015-05-04 NOTE — Assessment & Plan Note (Signed)
Encouraged Losartan and Bystolic bid. Minimize sodium and caffeine

## 2015-05-04 NOTE — Assessment & Plan Note (Signed)
Encouraged heart healthy diet, increase exercise, avoid trans fats, consider a krill oil cap daily 

## 2015-05-04 NOTE — Assessment & Plan Note (Signed)
Encouraged good sleep hygiene such as dark, quiet room. No blue/green glowing lights such as computer screens in bedroom. No alcohol or stimulants in evening. Cut down on caffeine as able. Regular exercise is helpful but not just prior to bed time.  

## 2015-05-04 NOTE — Assessment & Plan Note (Signed)
minimize simple carbs. Increase exercise as tolerated.  

## 2015-05-04 NOTE — Assessment & Plan Note (Signed)
Doing better with low dose Lexapro

## 2015-05-13 ENCOUNTER — Ambulatory Visit (INDEPENDENT_AMBULATORY_CARE_PROVIDER_SITE_OTHER): Payer: Medicare Other | Admitting: Cardiovascular Disease

## 2015-05-13 ENCOUNTER — Encounter: Payer: Self-pay | Admitting: Cardiovascular Disease

## 2015-05-13 VITALS — BP 148/72 | HR 46 | Ht 62.0 in | Wt 132.4 lb

## 2015-05-13 DIAGNOSIS — E785 Hyperlipidemia, unspecified: Secondary | ICD-10-CM | POA: Diagnosis not present

## 2015-05-13 DIAGNOSIS — I10 Essential (primary) hypertension: Secondary | ICD-10-CM | POA: Diagnosis not present

## 2015-05-13 MED ORDER — NEBIVOLOL HCL 10 MG PO TABS: 5.0000 mg | ORAL_TABLET | Freq: Every day | ORAL | Status: DC

## 2015-05-13 NOTE — Patient Instructions (Signed)
Your physician has recommended you make the following change in your medication:  1.) decrease Bystolic to 5 mg once daily  Your physician recommends that you schedule a follow-up appointment as needed with Dr. Fletcher Anon.

## 2015-05-13 NOTE — Progress Notes (Signed)
PCP: Dr. Charlett Blake  HPI  This is an 81 year old male who  is here today for a followup visit regarding refractory hypertension and exertional dyspnea. He reports having hypertension for many years. However, he did not start taking medications until few years ago. His blood pressure has been difficult to control.  A nuclear stress test in 12/2013 showed no evidence of ischemia with normal ejection fraction. Renal artery duplex showed no evidence of renal artery stenosis. He reports feeling bad when he takes his blood pressure medications. Thus, compliance has been an issue. He is currently taking only losartan and Bystolic. He was supposed to be on hydrochlorothiazide and spironolactone. He denies any chest pain or shortness of breath.   Allergies  Allergen Reactions  . Hctz [Hydrochlorothiazide] Rash  . Bactrim [Sulfamethoxazole-Trimethoprim] Rash     Current Outpatient Prescriptions on File Prior to Visit  Medication Sig Dispense Refill  . aspirin EC 81 MG tablet Take 1 tablet (81 mg total) by mouth daily.    Marland Kitchen escitalopram (LEXAPRO) 5 MG tablet Take 1 tablet (5 mg total) by mouth daily. 30 tablet 6  . losartan (COZAAR) 100 MG tablet Take 0.5 tablets (50 mg total) by mouth 2 (two) times daily. 30 tablet 5  . nebivolol (BYSTOLIC) 10 MG tablet Take 0.5 tablets (5 mg total) by mouth 2 (two) times daily. 30 tablet 5   No current facility-administered medications on file prior to visit.     Past Medical History  Diagnosis Date  . HTN (hypertension) 09/12/2011  . Urinary urgency 10/12/2011  . Elevated PSA 11/03/2011  . Thrombocytopenia 11/17/2011    slight  . Rash, skin 01/02/2012    Left lower leg  . Constipation 02/10/2012  . Intertrigo 03/20/2012  . Urinary hesitancy 10/12/2011  . Dermatitis 09/13/2012  . Uncontrolled hypertension 09/12/2011  . Medicare annual wellness visit, subsequent 09/12/2011  . Anxiety state, unspecified 12/19/2013  . Hyperlipidemia, mild 04/17/2015     No past  surgical history on file.   Family History  Problem Relation Age of Onset  . Migraines Daughter   . Allergies Daughter   . Allergies Son      Social History   Social History  . Marital Status: Divorced    Spouse Name: N/A  . Number of Children: N/A  . Years of Education: N/A   Occupational History  . Not on file.   Social History Main Topics  . Smoking status: Never Smoker   . Smokeless tobacco: Never Used  . Alcohol Use: Yes     Comment: occasionally  . Drug Use: No  . Sexual Activity: No   Other Topics Concern  . Not on file   Social History Narrative     ROS A 10 point review of system was performed. It is negative other than that mentioned in the history of present illness.   PHYSICAL EXAM   BP 148/72 mmHg  Pulse 46  Ht 5\' 2"  (1.575 m)  Wt 132 lb 6.4 oz (60.056 kg)  BMI 24.21 kg/m2 Constitutional: He is oriented to person, place, and time. He appears well-developed and well-nourished. No distress.  HENT: No nasal discharge.  Head: Normocephalic and atraumatic.  Eyes: Pupils are equal and round.  No discharge. Neck: Normal range of motion. Neck supple. No JVD present. No thyromegaly present. No carotid bruit Cardiovascular: Normal rate, regular rhythm, normal heart sounds. Exam reveals no gallop and no friction rub. No murmur heard.  Pulmonary/Chest: Effort normal and breath sounds  normal. No stridor. No respiratory distress. He has no wheezes. He has no rales. He exhibits no tenderness.  Abdominal: Soft. Bowel sounds are normal. He exhibits no distension. There is no tenderness. There is no rebound and no guarding. No abdominal bruit.  Musculoskeletal: Normal range of motion. He exhibits no edema and no tenderness.  Neurological: He is alert and oriented to person, place, and time. Coordination normal.  Skin: Skin is warm and dry. No rash noted. He is not diaphoretic. No erythema. No pallor.  Psychiatric: He has a normal mood and affect. His behavior is  normal. Judgment and thought content normal.     EKG: Sinus bradycardia with first-degree AV block. Nonspecific T wave changes.   ASSESSMENT AND PLAN

## 2015-05-13 NOTE — Assessment & Plan Note (Addendum)
Blood pressure seems to be more controlled than before. He is very bradycardic today but overall is asymptomatic. I elected to decrease the dose of Bystolic to 5 mg once daily. If blood pressure remains uncontrolled, spironolactone or amlodipine can be added. It appears that he had a rash with hydrochlorothiazide in the  Past. I discussed with him the importance of compliance. He can follow-up with me as needed.

## 2015-05-21 ENCOUNTER — Other Ambulatory Visit: Payer: Self-pay | Admitting: Family Medicine

## 2015-05-21 NOTE — Telephone Encounter (Signed)
Pt is seen by Dr. Fletcher Anon and states medication was just renewed. Are you willing to refill? Please advise.

## 2015-12-16 ENCOUNTER — Telehealth: Payer: Self-pay

## 2015-12-16 NOTE — Telephone Encounter (Signed)
Left patient a message to schedule AWV with nurse

## 2016-02-22 DIAGNOSIS — L239 Allergic contact dermatitis, unspecified cause: Secondary | ICD-10-CM | POA: Diagnosis not present

## 2016-03-09 ENCOUNTER — Ambulatory Visit: Payer: Medicare Other | Admitting: Family Medicine

## 2016-04-08 ENCOUNTER — Encounter: Payer: Self-pay | Admitting: Family Medicine

## 2016-04-08 NOTE — Telephone Encounter (Signed)
error:[id number] ° °

## 2016-04-09 ENCOUNTER — Other Ambulatory Visit: Payer: Self-pay | Admitting: Family Medicine

## 2016-04-09 DIAGNOSIS — I10 Essential (primary) hypertension: Secondary | ICD-10-CM

## 2016-04-29 ENCOUNTER — Encounter: Payer: Self-pay | Admitting: Family Medicine

## 2016-04-29 ENCOUNTER — Ambulatory Visit (INDEPENDENT_AMBULATORY_CARE_PROVIDER_SITE_OTHER): Payer: Medicare Other | Admitting: Family Medicine

## 2016-04-29 DIAGNOSIS — D696 Thrombocytopenia, unspecified: Secondary | ICD-10-CM

## 2016-04-29 DIAGNOSIS — I10 Essential (primary) hypertension: Secondary | ICD-10-CM | POA: Diagnosis not present

## 2016-04-29 DIAGNOSIS — G4700 Insomnia, unspecified: Secondary | ICD-10-CM | POA: Diagnosis not present

## 2016-04-29 DIAGNOSIS — R739 Hyperglycemia, unspecified: Secondary | ICD-10-CM

## 2016-04-29 DIAGNOSIS — E785 Hyperlipidemia, unspecified: Secondary | ICD-10-CM

## 2016-04-29 MED ORDER — NEBIVOLOL HCL 10 MG PO TABS: ORAL_TABLET | ORAL | 1 refills | Status: DC

## 2016-04-29 NOTE — Patient Instructions (Signed)
Hypertension Hypertension, commonly called high blood pressure, is when the force of blood pumping through your arteries is too strong. Your arteries are the blood vessels that carry blood from your heart throughout your body. A blood pressure reading consists of a higher number over a lower number, such as 110/72. The higher number (systolic) is the pressure inside your arteries when your heart pumps. The lower number (diastolic) is the pressure inside your arteries when your heart relaxes. Ideally you want your blood pressure below 120/80. Hypertension forces your heart to work harder to pump blood. Your arteries may become narrow or stiff. Having untreated or uncontrolled hypertension can cause heart attack, stroke, kidney disease, and other problems. RISK FACTORS Some risk factors for high blood pressure are controllable. Others are not.  Risk factors you cannot control include:   Race. You may be at higher risk if you are African American.  Age. Risk increases with age.  Gender. Men are at higher risk than women before age 45 years. After age 65, women are at higher risk than men. Risk factors you can control include:  Not getting enough exercise or physical activity.  Being overweight.  Getting too much fat, sugar, calories, or salt in your diet.  Drinking too much alcohol. SIGNS AND SYMPTOMS Hypertension does not usually cause signs or symptoms. Extremely high blood pressure (hypertensive crisis) may cause headache, anxiety, shortness of breath, and nosebleed. DIAGNOSIS To check if you have hypertension, your health care provider will measure your blood pressure while you are seated, with your arm held at the level of your heart. It should be measured at least twice using the same arm. Certain conditions can cause a difference in blood pressure between your right and left arms. A blood pressure reading that is higher than normal on one occasion does not mean that you need treatment. If  it is not clear whether you have high blood pressure, you may be asked to return on a different day to have your blood pressure checked again. Or, you may be asked to monitor your blood pressure at home for 1 or more weeks. TREATMENT Treating high blood pressure includes making lifestyle changes and possibly taking medicine. Living a healthy lifestyle can help lower high blood pressure. You may need to change some of your habits. Lifestyle changes may include:  Following the DASH diet. This diet is high in fruits, vegetables, and whole grains. It is low in salt, red meat, and added sugars.  Keep your sodium intake below 2,300 mg per day.  Getting at least 30-45 minutes of aerobic exercise at least 4 times per week.  Losing weight if necessary.  Not smoking.  Limiting alcoholic beverages.  Learning ways to reduce stress. Your health care provider may prescribe medicine if lifestyle changes are not enough to get your blood pressure under control, and if one of the following is true:  You are 18-59 years of age and your systolic blood pressure is above 140.  You are 60 years of age or older, and your systolic blood pressure is above 150.  Your diastolic blood pressure is above 90.  You have diabetes, and your systolic blood pressure is over 140 or your diastolic blood pressure is over 90.  You have kidney disease and your blood pressure is above 140/90.  You have heart disease and your blood pressure is above 140/90. Your personal target blood pressure may vary depending on your medical conditions, your age, and other factors. HOME CARE INSTRUCTIONS    Have your blood pressure rechecked as directed by your health care provider.   Take medicines only as directed by your health care provider. Follow the directions carefully. Blood pressure medicines must be taken as prescribed. The medicine does not work as well when you skip doses. Skipping doses also puts you at risk for  problems.  Do not smoke.   Monitor your blood pressure at home as directed by your health care provider. SEEK MEDICAL CARE IF:   You think you are having a reaction to medicines taken.  You have recurrent headaches or feel dizzy.  You have swelling in your ankles.  You have trouble with your vision. SEEK IMMEDIATE MEDICAL CARE IF:  You develop a severe headache or confusion.  You have unusual weakness, numbness, or feel faint.  You have severe chest or abdominal pain.  You vomit repeatedly.  You have trouble breathing. MAKE SURE YOU:   Understand these instructions.  Will watch your condition.  Will get help right away if you are not doing well or get worse.   This information is not intended to replace advice given to you by your health care provider. Make sure you discuss any questions you have with your health care provider.   Document Released: 08/02/2005 Document Revised: 12/17/2014 Document Reviewed: 05/25/2013 Elsevier Interactive Patient Education 2016 [employer]  

## 2016-04-29 NOTE — Progress Notes (Signed)
Pre visit review using our clinic review tool, if applicable. No additional management support is needed unless otherwise documented below in the visit note. 

## 2016-05-09 ENCOUNTER — Encounter: Payer: Self-pay | Admitting: Family Medicine

## 2016-05-09 NOTE — Assessment & Plan Note (Signed)
Slight, asymptomatic, will continue to monitor

## 2016-05-09 NOTE — Assessment & Plan Note (Signed)
minimize simple carbs. Increase exercise as tolerated.  

## 2016-05-09 NOTE — Progress Notes (Signed)
Patient ID: Steven Morrison, male   DOB: 03/16/1928, 82 y.o.   MRN: [id number]   Subjective:    Patient ID: Steven Morrison, male    DOB: 24-Jun-1928, 82 y.o.   MRN: [id number]  Chief Complaint  Patient presents with  . Follow-up    HPI Patient is in today for follow up. He is feeling well today. No recent illness or acute concerns. Is taking his blood pressure medications routinely. Denies CP/palp/SOB/HA/congestion/fevers/GI or GU c/o. Taking meds as prescribed  Past Medical History:  Diagnosis Date  . Anxiety state, unspecified 12/19/2013  . Benign essential HTN 09/12/2011   Does not tolerate thiazide diuretics developed very bad dermatitis   . Constipation 02/10/2012  . Dermatitis 09/13/2012  . Elevated PSA 11/03/2011  . HTN (hypertension) 09/12/2011  . Hyperlipidemia, mild 04/17/2015  . Intertrigo 03/20/2012  . Medicare annual wellness visit, subsequent 09/12/2011  . Rash, skin 01/02/2012   Left lower leg  . Thrombocytopenia (HCC) 11/17/2011   slight  . Uncontrolled hypertension 09/12/2011  . Urinary hesitancy 10/12/2011  . Urinary urgency 10/12/2011    No past surgical history on file.  Family History  Problem Relation Age of Onset  . Migraines Daughter   . Allergies Daughter   . Allergies Son     Social History   Social History  . Marital status: Divorced    Spouse name: N/A  . Number of children: N/A  . Years of education: N/A   Occupational History  . Not on file.   Social History Main Topics  . Smoking status: Never Smoker  . Smokeless tobacco: Never Used  . Alcohol use Yes     Comment: occasionally  . Drug use: No  . Sexual activity: No   Other Topics Concern  . Not on file   Social History Narrative  . No narrative on file    Outpatient Medications Prior to Visit  Medication Sig Dispense Refill  . aspirin EC 81 MG tablet Take 1 tablet (81 mg total) by mouth daily.    Marland Kitchen BYSTOLIC 10 MG tablet TAKE 1/2 TABLET(5 MG) BY MOUTH TWICE DAILY 30 tablet 0    . escitalopram (LEXAPRO) 5 MG tablet Take 1 tablet (5 mg total) by mouth daily. 30 tablet 6  . losartan (COZAAR) 100 MG tablet Take 0.5 tablets (50 mg total) by mouth 2 (two) times daily. (Patient taking differently: Take 50 mg by mouth daily. ) 30 tablet 5  . BYSTOLIC 10 MG tablet TAKE 1 TABLET(10 MG) BY MOUTH DAILY 90 tablet 1  . nebivolol (BYSTOLIC) 10 MG tablet Take 0.5 tablets (5 mg total) by mouth daily. 30 tablet 5   No facility-administered medications prior to visit.     Allergies  Allergen Reactions  . Hctz [Hydrochlorothiazide] Rash  . Bactrim [Sulfamethoxazole-Trimethoprim] Rash    Review of Systems  Constitutional: Negative for fever and malaise/fatigue.  HENT: Negative for congestion.   Eyes: Negative for blurred vision.  Respiratory: Negative for shortness of breath.   Cardiovascular: Negative for chest pain, palpitations and leg swelling.  Gastrointestinal: Negative for abdominal pain, blood in stool and nausea.  Genitourinary: Negative for dysuria and frequency.  Musculoskeletal: Negative for falls.  Skin: Negative for rash.  Neurological: Negative for dizziness, loss of consciousness and headaches.  Endo/Heme/Allergies: Negative for environmental allergies.  Psychiatric/Behavioral: Negative for depression. The patient is not nervous/anxious.        Objective:    Physical Exam  Constitutional: He is oriented to person, place,  and time. He appears well-developed and well-nourished. No distress.  HENT:  Head: Normocephalic and atraumatic.  Nose: Nose normal.  Eyes: Right eye exhibits no discharge. Left eye exhibits no discharge.  Neck: Normal range of motion. Neck supple.  Cardiovascular: Normal rate and regular rhythm.   No murmur heard. Pulmonary/Chest: Effort normal and breath sounds normal.  Abdominal: Soft. Bowel sounds are normal. There is no tenderness.  Musculoskeletal: He exhibits no edema.  Neurological: He is alert and oriented to person, place,  and time.  Skin: Skin is warm and dry.  Psychiatric: He has a normal mood and affect.  Nursing note and vitals reviewed.   BP 128/78 (BP Location: Left Arm, Patient Position: Sitting, Cuff Size: Normal)   Pulse (!) 53   Temp 98.6 F (37 C) (Oral)   Ht 5\' 2"  (1.575 m)   Wt 133 lb 6 oz (60.5 kg)   SpO2 97%   BMI 24.39 kg/m  Wt Readings from Last 3 Encounters:  04/29/16 133 lb 6 oz (60.5 kg)  05/13/15 132 lb 6.4 oz (60.1 kg)  04/17/15 129 lb 8 oz (58.7 kg)     Lab Results  Component Value Date   WBC 6.0 04/17/2015   HGB 13.8 04/17/2015   HCT 42.0 04/17/2015   PLT 158.0 04/17/2015   GLUCOSE 71 04/17/2015   CHOL 159 04/17/2015   TRIG 121.0 04/17/2015   HDL 36.70 (L) 04/17/2015   LDLCALC 98 04/17/2015   ALT 13 04/17/2015   AST 18 04/17/2015   NA 140 04/17/2015   K 4.5 04/17/2015   CL 105 04/17/2015   CREATININE 1.22 04/17/2015   BUN 17 04/17/2015   CO2 28 04/17/2015   TSH 2.89 04/17/2015   PSA 5.70 (H) 10/12/2011   HGBA1C 5.8 04/17/2015    Lab Results  Component Value Date   TSH 2.89 04/17/2015   Lab Results  Component Value Date   WBC 6.0 04/17/2015   HGB 13.8 04/17/2015   HCT 42.0 04/17/2015   MCV 84.4 04/17/2015   PLT 158.0 04/17/2015   Lab Results  Component Value Date   NA 140 04/17/2015   K 4.5 04/17/2015   CO2 28 04/17/2015   GLUCOSE 71 04/17/2015   BUN 17 04/17/2015   CREATININE 1.22 04/17/2015   BILITOT 0.5 04/17/2015   ALKPHOS 42 04/17/2015   AST 18 04/17/2015   ALT 13 04/17/2015   PROT 7.4 04/17/2015   ALBUMIN 4.2 04/17/2015   CALCIUM 9.1 04/17/2015   GFR 59.99 (L) 04/17/2015   Lab Results  Component Value Date   CHOL 159 04/17/2015   Lab Results  Component Value Date   HDL 36.70 (L) 04/17/2015   Lab Results  Component Value Date   LDLCALC 98 04/17/2015   Lab Results  Component Value Date   TRIG 121.0 04/17/2015   Lab Results  Component Value Date   CHOLHDL 4 04/17/2015   Lab Results  Component Value Date   HGBA1C  5.8 04/17/2015       Assessment & Plan:   Problem List Items Addressed This Visit    Benign essential HTN    Well controlled, no changes to meds. Encouraged heart healthy diet such as the DASH diet and exercise as tolerated.       Relevant Medications   nebivolol (BYSTOLIC) 10 MG tablet   Insomnia   Thrombocytopenia (HCC)    Slight, asymptomatic, will continue to monitor      Hyperglycemia     minimize simple  carbs. Increase exercise as tolerated.       Hyperlipidemia, mild    Encouraged heart healthy diet, increase exercise, avoid trans fats, consider a krill oil cap daily      Relevant Medications   nebivolol (BYSTOLIC) 10 MG tablet    Other Visit Diagnoses    Essential hypertension       Relevant Medications   nebivolol (BYSTOLIC) 10 MG tablet      I have discontinued Mr. Speier losartan, escitalopram, nebivolol, and BYSTOLIC. I have also changed his BYSTOLIC to nebivolol. Additionally, I am having him maintain his aspirin EC.  Meds ordered this encounter  Medications  . nebivolol (BYSTOLIC) 10 MG tablet    Sig: TAKE 1/2 TABLET(5 MG) BY MOUTH TWICE DAILY    Dispense:  90 tablet    Refill:  1     Kinya Meine, MD

## 2016-05-09 NOTE — Assessment & Plan Note (Signed)
Well controlled, no changes to meds. Encouraged heart healthy diet such as the DASH diet and exercise as tolerated.  °

## 2016-05-09 NOTE — Assessment & Plan Note (Signed)
Encouraged heart healthy diet, increase exercise, avoid trans fats, consider a krill oil cap daily 

## 2016-05-11 ENCOUNTER — Other Ambulatory Visit: Payer: Self-pay | Admitting: Family Medicine

## 2016-05-11 DIAGNOSIS — I10 Essential (primary) hypertension: Secondary | ICD-10-CM

## 2016-05-11 MED ORDER — ASPIRIN EC 81 MG PO TBEC: 81.0000 mg | DELAYED_RELEASE_TABLET | Freq: Every day | ORAL | Status: AC

## 2016-05-11 NOTE — Telephone Encounter (Signed)
Relation to [id number]  Call back number:[phone] Pharmacy:  [facility] [place], [address] [phone] (Phone) [phone] (Fax)     Reason for call:  Patient requesting a refill nebivolol (BYSTOLIC) 10 MG tablet

## 2016-07-01 ENCOUNTER — Telehealth: Payer: Self-pay | Admitting: Family Medicine

## 2016-07-01 NOTE — Telephone Encounter (Signed)
Patient called stating that he is experiencing dizziness. Transferred to [facility]. Spoke with Judson Roch

## 2016-07-01 NOTE — Telephone Encounter (Signed)
Patient Name: Steven Morrison  DOB: 11/08/1927    Initial Comment Caller states he is having dizziness and a ringing noise in his right ear.    Nurse Assessment  Nurse: Raphael Gibney, RN, Vanita Ingles Date/Time Eilene Ghazi Time): 07/01/2016 11:20:35 AM  Confirm and document reason for call. If symptomatic, describe symptoms. You must click the next button to save text entered. ---Caller states he has ringing in his right ear. Has some dizziness. He is able to ambulate. dizziness subsided after a couple of hrs. No pain.  Has the patient traveled out of the country within the last 30 days? ---Not Applicable  Does the patient have any new or worsening symptoms? ---Yes  Will a triage be completed? ---Yes  Related visit to physician within the last 2 weeks? ---No  Does the PT have any chronic conditions? (i.e. diabetes, asthma, etc.) ---No  Is this a behavioral health or substance abuse call? ---No     Guidelines    Guideline Title Affirmed Question Affirmed Notes  Tinnitus Symptoms only or mainly in 1 ear (unilateral tinnitus)    Final Disposition User   See PCP When Office is Open (within 3 days) Raphael Gibney, RN, Vanita Ingles    Comments  appt scheduled for 07/02/2016 at 10:15 am with Dr. Charlett Blake   Referrals  REFERRED TO PCP OFFICE   Disagree/Comply: Comply

## 2016-07-01 NOTE — Telephone Encounter (Signed)
Pt scheduled for acute appt w/ PCP 07/02/16.

## 2016-07-01 NOTE — Telephone Encounter (Signed)
See [facility] telephone note.

## 2016-07-02 ENCOUNTER — Encounter: Payer: Self-pay | Admitting: Family Medicine

## 2016-07-02 ENCOUNTER — Ambulatory Visit (INDEPENDENT_AMBULATORY_CARE_PROVIDER_SITE_OTHER): Payer: Medicare Other | Admitting: Family Medicine

## 2016-07-02 VITALS — BP 180/90 | HR 55 | Temp 98.1°F | Wt 137.4 lb

## 2016-07-02 DIAGNOSIS — H8111 Benign paroxysmal vertigo, right ear: Secondary | ICD-10-CM | POA: Diagnosis not present

## 2016-07-02 DIAGNOSIS — I10 Essential (primary) hypertension: Secondary | ICD-10-CM

## 2016-07-02 DIAGNOSIS — H9191 Unspecified hearing loss, right ear: Secondary | ICD-10-CM | POA: Diagnosis not present

## 2016-07-02 DIAGNOSIS — G44209 Tension-type headache, unspecified, not intractable: Secondary | ICD-10-CM

## 2016-07-02 DIAGNOSIS — H9311 Tinnitus, right ear: Secondary | ICD-10-CM | POA: Diagnosis not present

## 2016-07-02 DIAGNOSIS — R739 Hyperglycemia, unspecified: Secondary | ICD-10-CM

## 2016-07-02 NOTE — Patient Instructions (Signed)
Tinnitus  Tinnitus refers to hearing a sound when there is no actual source for that sound. This is often described as ringing in the ears. However, people with this condition may hear a variety of noises. A person may hear the sound in one ear or in both ears.  The sounds of tinnitus can be soft, loud, or somewhere in between. Tinnitus can last for a few seconds or can be constant for days. It may go away without treatment and come back at various times. When tinnitus is constant or happens often, it can lead to other problems, such as trouble sleeping and trouble concentrating.  Almost everyone experiences tinnitus at some point. Tinnitus that is long-lasting (chronic) or comes back often is a problem that may require medical attention.  What are the causes?  The cause of tinnitus is often not known. In some cases, it can result from other problems or conditions, including:  · Exposure to loud noises from machinery, music, or other sources.  · Hearing loss.  · Ear or sinus infections.  · Earwax buildup.  · A foreign object in the ear.  · Use of certain medicines.  · Use of alcohol and caffeine.  · High blood pressure.  · Heart diseases.  · Anemia.  · Allergies.  · Meniere disease.  · Thyroid problems.  · Tumors.  · An enlarged part of a weakened blood vessel (aneurysm).    What are the signs or symptoms?  The main symptom of tinnitus is hearing a sound when there is no source for that sound. It may sound like:  · Buzzing.  · Roaring.  · Ringing.  · Blowing air, similar to the sound heard when you listen to a seashell.  · Hissing.  · Whistling.  · Sizzling.  · Humming.  · Running water.  · A sustained musical note.    How is this diagnosed?  Tinnitus is diagnosed based on your symptoms. Your health care provider will do a physical exam. A comprehensive hearing exam (audiologic exam) will be done if your tinnitus:  · Affects only one ear (unilateral).  · Causes hearing difficulties.  · Lasts 6 months or  longer.    You may also need to see a health care provider who specializes in hearing disorders (audiologist). You may be asked to complete a questionnaire to determine the severity of your tinnitus. Tests may be done to help determine the cause and to rule out other conditions. These can include:  · Imaging studies of your head and brain, such as:  ? A CT scan.  ? An MRI.  · An imaging study of your blood vessels (angiogram).    How is this treated?  Treating an underlying medical condition can sometimes make tinnitus go away. If your tinnitus continues, other treatments may include:  · Medicines, such as certain antidepressants or sleeping aids.  · Sound generators to mask the tinnitus. These include:  ? Tabletop sound machines that play relaxing sounds to help you fall asleep.  ? Wearable devices that fit in your ear and play sounds or music.  ? A small device that uses headphones to deliver a signal embedded in music (acoustic neural stimulation). In time, this may change the pathways of your brain and make you less sensitive to tinnitus. This device is used for very severe cases when no other treatment is working.  · Therapy and counseling to help you manage the stress of living with tinnitus.  · Using   hearing aids or cochlear implants, if your tinnitus is related to hearing loss.    Follow these instructions at home:  · When possible, avoid being in loud places and being exposed to loud sounds.  · Wear hearing protection, such as earplugs, when you are exposed to loud noises.  · Do not take stimulants, such as nicotine, alcohol, or caffeine.  · Practice techniques for reducing stress, such as meditation, yoga, or deep breathing.  · Use a white noise machine, a humidifier, or other devices to mask the sound of tinnitus.  · Sleep with your head slightly raised. This may reduce the impact of tinnitus.  · Try to get plenty of rest each night.  Contact a health care provider if:  · You have tinnitus in just one  ear.  · Your tinnitus continues for 3 weeks or longer without stopping.  · Home care measures are not helping.  · You have tinnitus after a head injury.  · You have tinnitus along with any of the following:  ? Dizziness.  ? Loss of balance.  ? Nausea and vomiting.  This information is not intended to replace advice given to you by your health care provider. Make sure you discuss any questions you have with your health care provider.  Document Released: 08/02/2005 Document Revised: 04/04/2016 Document Reviewed: 01/02/2014  Elsevier Interactive Patient Education © 2017 [employer]

## 2016-07-02 NOTE — Progress Notes (Signed)
Pre visit review using our clinic review tool, if applicable. No additional management support is needed unless otherwise documented below in the visit note. 

## 2016-07-03 ENCOUNTER — Ambulatory Visit (HOSPITAL_BASED_OUTPATIENT_CLINIC_OR_DEPARTMENT_OTHER)
Admission: RE | Admit: 2016-07-03 | Discharge: 2016-07-03 | Disposition: A | Discharge status: Home / Self Care | Payer: Medicare Other | Source: Ambulatory Visit | Attending: Family Medicine | Admitting: Family Medicine

## 2016-07-03 DIAGNOSIS — G44209 Tension-type headache, unspecified, not intractable: Secondary | ICD-10-CM | POA: Insufficient documentation

## 2016-07-03 DIAGNOSIS — H9311 Tinnitus, right ear: Secondary | ICD-10-CM | POA: Diagnosis not present

## 2016-07-03 DIAGNOSIS — H9191 Unspecified hearing loss, right ear: Secondary | ICD-10-CM | POA: Diagnosis not present

## 2016-07-03 DIAGNOSIS — H8111 Benign paroxysmal vertigo, right ear: Secondary | ICD-10-CM | POA: Diagnosis not present

## 2016-07-03 DIAGNOSIS — R42 Dizziness and giddiness: Secondary | ICD-10-CM | POA: Diagnosis not present

## 2016-07-18 DIAGNOSIS — H9311 Tinnitus, right ear: Secondary | ICD-10-CM | POA: Insufficient documentation

## 2016-07-18 NOTE — Assessment & Plan Note (Signed)
Improved on recheck, no further changes at this time

## 2016-07-18 NOTE — Assessment & Plan Note (Signed)
With vertigo and hearing loss in right ear. CT of head unremarkable, will refer to ENT for further consideration at this time.

## 2016-07-18 NOTE — Progress Notes (Signed)
Patient ID: Steven Morrison, male   DOB: 08/19/1927, 82 y.o.   MRN: [id number]   Subjective:    Patient ID: Steven Morrison, male    DOB: 10/27/1927, 82 y.o.   MRN: [id number]  No chief complaint on file.   HPI Patient is in today for evaluation of ringing in his right ear worsening. He also notes flare in vertigo and dizziness and hearing loss. Endorses mild generalized headache also. No photophobia, phonophobia or vision changes. No other neurologic complaints and no fevers or signs of acute illness. Does endorse some mild head congestion similar to his ongoing allergic symptoms. No fall or recent head trauma. Denies CP/palp/SOB/fevers/GI or GU c/o. Taking meds as prescribed  Past Medical History:  Diagnosis Date  . Anxiety state, unspecified 12/19/2013  . Benign essential HTN 09/12/2011   Does not tolerate thiazide diuretics developed very bad dermatitis   . Constipation 02/10/2012  . Dermatitis 09/13/2012  . Elevated PSA 11/03/2011  . HTN (hypertension) 09/12/2011  . Hyperlipidemia, mild 04/17/2015  . Intertrigo 03/20/2012  . Medicare annual wellness visit, subsequent 09/12/2011  . Rash, skin 01/02/2012   Left lower leg  . Thrombocytopenia ([place]) 11/17/2011   slight  . Uncontrolled hypertension 09/12/2011  . Urinary hesitancy 10/12/2011  . Urinary urgency 10/12/2011    No past surgical history on file.  Family History  Problem Relation Age of Onset  . Migraines Daughter   . Allergies Daughter   . Allergies Son     Social History   Social History  . Marital status: Divorced    Spouse name: N/A  . Number of children: N/A  . Years of education: N/A   Occupational History  . Not on file.   Social History Main Topics  . Smoking status: Never Smoker  . Smokeless tobacco: Never Used  . Alcohol use Yes     Comment: occasionally  . Drug use: No  . Sexual activity: No   Other Topics Concern  . Not on file   Social History Narrative  . No narrative on file     Outpatient Medications Prior to Visit  Medication Sig Dispense Refill  . aspirin EC 81 MG tablet Take 1 tablet (81 mg total) by mouth daily.    . nebivolol (BYSTOLIC) 10 MG tablet TAKE 1/2 TABLET(5 MG) BY MOUTH TWICE DAILY 90 tablet 1   No facility-administered medications prior to visit.     Allergies  Allergen Reactions  . Hctz [Hydrochlorothiazide] Rash  . Bactrim [Sulfamethoxazole-Trimethoprim] Rash    Review of Systems  Constitutional: Negative for fever and malaise/fatigue.  HENT: Positive for hearing loss and tinnitus. Negative for congestion, ear discharge and ear pain.   Eyes: Negative for blurred vision.  Respiratory: Negative for shortness of breath.   Cardiovascular: Negative for chest pain, palpitations and leg swelling.  Gastrointestinal: Negative for abdominal pain, blood in stool and nausea.  Genitourinary: Negative for dysuria and frequency.  Musculoskeletal: Negative for falls.  Skin: Negative for rash.  Neurological: Positive for headaches. Negative for dizziness and loss of consciousness.  Endo/Heme/Allergies: Negative for environmental allergies.  Psychiatric/Behavioral: Negative for depression. The patient is nervous/anxious.        Objective:    Physical Exam  Constitutional: He is oriented to person, place, and time. He appears well-developed and well-nourished. No distress.  HENT:  Head: Normocephalic and atraumatic.  Nose: Nose normal.  Eyes: Right eye exhibits no discharge. Left eye exhibits no discharge.  Neck: Normal range of motion. Neck  supple.  Cardiovascular: Normal rate and regular rhythm.   No murmur heard. Pulmonary/Chest: Effort normal and breath sounds normal.  Abdominal: Soft. Bowel sounds are normal. There is no tenderness.  Musculoskeletal: He exhibits no edema.  Neurological: He is alert and oriented to person, place, and time. He has normal reflexes. No cranial nerve deficit. Coordination normal.  Skin: Skin is warm and  dry.  Psychiatric: He has a normal mood and affect.  Nursing note and vitals reviewed.   BP (!) 180/90 (BP Location: Left Arm, Patient Position: Sitting, Cuff Size: Normal)   Pulse (!) 55   Temp 98.1 F (36.7 C) (Oral)   Wt 137 lb 6.4 oz (62.3 kg)   SpO2 98%   BMI 25.13 kg/m  Wt Readings from Last 3 Encounters:  07/02/16 137 lb 6.4 oz (62.3 kg)  04/29/16 133 lb 6 oz (60.5 kg)  05/13/15 132 lb 6.4 oz (60.1 kg)     Lab Results  Component Value Date   WBC 6.0 04/17/2015   HGB 13.8 04/17/2015   HCT 42.0 04/17/2015   PLT 158.0 04/17/2015   GLUCOSE 71 04/17/2015   CHOL 159 04/17/2015   TRIG 121.0 04/17/2015   HDL 36.70 (L) 04/17/2015   LDLCALC 98 04/17/2015   ALT 13 04/17/2015   AST 18 04/17/2015   NA 140 04/17/2015   K 4.5 04/17/2015   CL 105 04/17/2015   CREATININE 1.22 04/17/2015   BUN 17 04/17/2015   CO2 28 04/17/2015   TSH 2.89 04/17/2015   PSA 5.70 (H) 10/12/2011   HGBA1C 5.8 04/17/2015    Lab Results  Component Value Date   TSH 2.89 04/17/2015   Lab Results  Component Value Date   WBC 6.0 04/17/2015   HGB 13.8 04/17/2015   HCT 42.0 04/17/2015   MCV 84.4 04/17/2015   PLT 158.0 04/17/2015   Lab Results  Component Value Date   NA 140 04/17/2015   K 4.5 04/17/2015   CO2 28 04/17/2015   GLUCOSE 71 04/17/2015   BUN 17 04/17/2015   CREATININE 1.22 04/17/2015   BILITOT 0.5 04/17/2015   ALKPHOS 42 04/17/2015   AST 18 04/17/2015   ALT 13 04/17/2015   PROT 7.4 04/17/2015   ALBUMIN 4.2 04/17/2015   CALCIUM 9.1 04/17/2015   GFR 59.99 (L) 04/17/2015   Lab Results  Component Value Date   CHOL 159 04/17/2015   Lab Results  Component Value Date   HDL 36.70 (L) 04/17/2015   Lab Results  Component Value Date   LDLCALC 98 04/17/2015   Lab Results  Component Value Date   TRIG 121.0 04/17/2015   Lab Results  Component Value Date   CHOLHDL 4 04/17/2015   Lab Results  Component Value Date   HGBA1C 5.8 04/17/2015       Assessment & Plan:    Problem List Items Addressed This Visit    Benign essential HTN    Improved on recheck, no further changes at this time      Hyperglycemia     minimize simple carbs. Increase exercise as tolerated.      Tinnitus of right ear    With vertigo and hearing loss in right ear. CT of head unremarkable, will refer to ENT for further consideration at this time.      Relevant Orders   CT Head Wo Contrast (Completed)    Other Visit Diagnoses    Benign paroxysmal positional vertigo of right ear    -  Primary  Relevant Orders   CT Head Wo Contrast (Completed)   Hearing loss of right ear, unspecified hearing loss type       Relevant Orders   CT Head Wo Contrast (Completed)   Tension-type headache, not intractable, unspecified chronicity pattern       Relevant Orders   CT Head Wo Contrast (Completed)      I am having Mr. Hardiman maintain his nebivolol and aspirin EC.  No orders of the defined types were placed in this encounter.    [name], MD

## 2016-07-18 NOTE — Assessment & Plan Note (Signed)
minimize simple carbs. Increase exercise as tolerated.  

## 2016-07-19 ENCOUNTER — Telehealth: Payer: Self-pay | Admitting: Family Medicine

## 2016-07-19 NOTE — Telephone Encounter (Signed)
Called the patient informed of results/He is having no symptoms at this time they are all gone.  He does not want a referral at this time and can discuss at his next appt. In January.

## 2016-07-19 NOTE — Telephone Encounter (Signed)
-----   Message from [name], MD sent at 07/18/2016  6:02 PM EST ----- His CT head was unremarkable but if he is still having tinnitus and dizziness I would like to refer him to ENT for further consideration, please check with him and see if he is having symptoms and if that is ok with him

## 2016-09-03 ENCOUNTER — Ambulatory Visit: Payer: Medicare Other | Admitting: Family Medicine

## 2016-09-06 ENCOUNTER — Encounter: Payer: Self-pay | Admitting: Family Medicine

## 2016-09-06 ENCOUNTER — Ambulatory Visit (INDEPENDENT_AMBULATORY_CARE_PROVIDER_SITE_OTHER): Payer: Medicare Other | Admitting: Family Medicine

## 2016-09-06 DIAGNOSIS — D696 Thrombocytopenia, unspecified: Secondary | ICD-10-CM | POA: Diagnosis not present

## 2016-09-06 DIAGNOSIS — I10 Essential (primary) hypertension: Secondary | ICD-10-CM

## 2016-09-06 DIAGNOSIS — E785 Hyperlipidemia, unspecified: Secondary | ICD-10-CM

## 2016-09-06 DIAGNOSIS — R739 Hyperglycemia, unspecified: Secondary | ICD-10-CM

## 2016-09-06 DIAGNOSIS — L309 Dermatitis, unspecified: Secondary | ICD-10-CM

## 2016-09-06 LAB — COMPREHENSIVE METABOLIC PANEL
ALBUMIN: 4.3 g/dL (ref 3.5–5.2)
ALT: 17 U/L (ref 0–53)
AST: 22 U/L (ref 0–37)
Alkaline Phosphatase: 55 U/L (ref 39–117)
BUN: 13 mg/dL (ref 6–23)
CHLORIDE: 102 meq/L (ref 96–112)
CO2: 31 meq/L (ref 19–32)
Calcium: 9.6 mg/dL (ref 8.4–10.5)
Creatinine, Ser: 1.11 mg/dL (ref 0.40–1.50)
GFR: 66.68 mL/min (ref >60.00)
Glucose, Bld: 89 mg/dL (ref 70–99)
POTASSIUM: 4.8 meq/L (ref 3.5–5.1)
Sodium: 139 mEq/L (ref 135–145)
Total Bilirubin: 0.6 mg/dL (ref 0.2–1.2)
Total Protein: 7.8 g/dL (ref 6.0–8.3)

## 2016-09-06 LAB — CBC
HEMATOCRIT: 45.6 % (ref 39.0–52.0)
Hemoglobin: 15.1 g/dL (ref 13.0–17.0)
MCHC: 33.1 g/dL (ref 30.0–36.0)
MCV: 84.5 fl (ref 78.0–100.0)
Platelets: 174 10*3/uL (ref 150.0–400.0)
RBC: 5.39 Mil/uL (ref 4.22–5.81)
RDW: 14.9 % (ref 11.5–15.5)
WBC: 7.5 10*3/uL (ref 4.0–10.5)

## 2016-09-06 LAB — HEMOGLOBIN A1C: HEMOGLOBIN A1C: 6 % (ref 4.6–6.5)

## 2016-09-06 LAB — LIPID PANEL
CHOL/HDL RATIO: 5
CHOLESTEROL: 197 mg/dL (ref 0–200)
HDL: 41.7 mg/dL (ref >39.00)
NonHDL: 155.66
Triglycerides: 206 mg/dL — ABNORMAL HIGH (ref 0.0–149.0)
VLDL: 41.2 mg/dL — AB (ref 0.0–40.0)

## 2016-09-06 LAB — LDL CHOLESTEROL, DIRECT: LDL DIRECT: 124 mg/dL

## 2016-09-06 LAB — TSH: TSH: 2.34 u[IU]/mL (ref 0.35–4.50)

## 2016-09-06 NOTE — Assessment & Plan Note (Signed)
Check CBC today.  

## 2016-09-06 NOTE — Patient Instructions (Signed)
Hypertension Hypertension, commonly called high blood pressure, is when the force of blood pumping through your arteries is too strong. Your arteries are the blood vessels that carry blood from your heart throughout your body. A blood pressure reading consists of a higher number over a lower number, such as 110/72. The higher number (systolic) is the pressure inside your arteries when your heart pumps. The lower number (diastolic) is the pressure inside your arteries when your heart relaxes. Ideally you want your blood pressure below 120/80. Hypertension forces your heart to work harder to pump blood. Your arteries may become narrow or stiff. Having untreated or uncontrolled hypertension can cause heart attack, stroke, kidney disease, and other problems. What increases the risk? Some risk factors for high blood pressure are controllable. Others are not. Risk factors you cannot control include:  Race. You may be at higher risk if you are African American.  Age. Risk increases with age.  Gender. Men are at higher risk than women before age 45 years. After age 65, women are at higher risk than men. Risk factors you can control include:  Not getting enough exercise or physical activity.  Being overweight.  Getting too much fat, sugar, calories, or salt in your diet.  Drinking too much alcohol. What are the signs or symptoms? Hypertension does not usually cause signs or symptoms. Extremely high blood pressure (hypertensive crisis) may cause headache, anxiety, shortness of breath, and nosebleed. How is this diagnosed? To check if you have hypertension, your health care provider will measure your blood pressure while you are seated, with your arm held at the level of your heart. It should be measured at least twice using the same arm. Certain conditions can cause a difference in blood pressure between your right and left arms. A blood pressure reading that is higher than normal on one occasion does  not mean that you need treatment. If it is not clear whether you have high blood pressure, you may be asked to return on a different day to have your blood pressure checked again. Or, you may be asked to monitor your blood pressure at home for 1 or more weeks. How is this treated? Treating high blood pressure includes making lifestyle changes and possibly taking medicine. Living a healthy lifestyle can help lower high blood pressure. You may need to change some of your habits. Lifestyle changes may include:  Following the DASH diet. This diet is high in fruits, vegetables, and whole grains. It is low in salt, red meat, and added sugars.  Keep your sodium intake below 2,300 mg per day.  Getting at least 30-45 minutes of aerobic exercise at least 4 times per week.  Losing weight if necessary.  Not smoking.  Limiting alcoholic beverages.  Learning ways to reduce stress. Your health care provider may prescribe medicine if lifestyle changes are not enough to get your blood pressure under control, and if one of the following is true:  You are 18-59 years of age and your systolic blood pressure is above 140.  You are 60 years of age or older, and your systolic blood pressure is above 150.  Your diastolic blood pressure is above 90.  You have diabetes, and your systolic blood pressure is over 140 or your diastolic blood pressure is over 90.  You have kidney disease and your blood pressure is above 140/90.  You have heart disease and your blood pressure is above 140/90. Your personal target blood pressure may vary depending on your medical   conditions, your age, and other factors. Follow these instructions at home:  Have your blood pressure rechecked as directed by your health care provider.  Take medicines only as directed by your health care provider. Follow the directions carefully. Blood pressure medicines must be taken as prescribed. The medicine does not work as well when you skip  doses. Skipping doses also puts you at risk for problems.  Do not smoke.  Monitor your blood pressure at home as directed by your health care provider. Contact a health care provider if:  You think you are having a reaction to medicines taken.  You have recurrent headaches or feel dizzy.  You have swelling in your ankles.  You have trouble with your vision. Get help right away if:  You develop a severe headache or confusion.  You have unusual weakness, numbness, or feel faint.  You have severe chest or abdominal pain.  You vomit repeatedly.  You have trouble breathing. This information is not intended to replace advice given to you by your health care provider. Make sure you discuss any questions you have with your health care provider. Document Released: 08/02/2005 Document Revised: 01/08/2016 Document Reviewed: 05/25/2013 Elsevier Interactive Patient Education  2017 [employer]  

## 2016-09-06 NOTE — Progress Notes (Signed)
Pre visit review using our clinic review tool, if applicable. No additional management support is needed unless otherwise documented below in the visit note. 

## 2016-09-06 NOTE — Assessment & Plan Note (Signed)
No recent flares 

## 2016-09-06 NOTE — Assessment & Plan Note (Addendum)
Adequate controlled, improved on recheck, no changes to meds. Encouraged heart healthy diet such as the DASH diet and exercise as tolerated.

## 2016-09-06 NOTE — Progress Notes (Signed)
Subjective:    Patient ID: Steven Morrison, male    DOB: Nov 17, 1927, 83 y.o.   MRN: [id number]  Chief Complaint  Patient presents with  . Follow-up  . Hypertension  . Hyperlipidemia    HPI Patient is in today for a two month follow up on HTN, hyperlipidemia and more. No acute concerns noted. He feels well today. He continues to stay active and try to eat a heart healthy diet. He minimizes sodium intake and eats lots of fresh fruits and veg. No recent hospitalizations. No recurrence of his rash. Denies CP/palp/SOB/HA/congestion/fevers/GI or GU c/o. Taking meds as prescribed  Past Medical History:  Diagnosis Date  . Anxiety state, unspecified 12/19/2013  . Benign essential HTN 09/12/2011   Does not tolerate thiazide diuretics developed very bad dermatitis   . Constipation 02/10/2012  . Dermatitis 09/13/2012  . Elevated PSA 11/03/2011  . HTN (hypertension) 09/12/2011  . Hyperlipidemia, mild 04/17/2015  . Intertrigo 03/20/2012  . Medicare annual wellness visit, subsequent 09/12/2011  . Rash, skin 01/02/2012   Left lower leg  . Thrombocytopenia ([place]) 11/17/2011   slight  . Uncontrolled hypertension 09/12/2011  . Urinary hesitancy 10/12/2011  . Urinary urgency 10/12/2011    No past surgical history on file.  Family History  Problem Relation Age of Onset  . Migraines Daughter   . Allergies Daughter   . Allergies Son     Social History   Social History  . Marital status: Divorced    Spouse name: N/A  . Number of children: N/A  . Years of education: N/A   Occupational History  . Not on file.   Social History Main Topics  . Smoking status: Never Smoker  . Smokeless tobacco: Never Used  . Alcohol use Yes     Comment: occasionally  . Drug use: No  . Sexual activity: No   Other Topics Concern  . Not on file   Social History Narrative  . No narrative on file    Outpatient Medications Prior to Visit  Medication Sig Dispense Refill  . aspirin EC 81 MG tablet Take 1 tablet  (81 mg total) by mouth daily.    . nebivolol (BYSTOLIC) 10 MG tablet TAKE 1/2 TABLET(5 MG) BY MOUTH TWICE DAILY 90 tablet 1   No facility-administered medications prior to visit.     Allergies  Allergen Reactions  . Hctz [Hydrochlorothiazide] Rash  . Bactrim [Sulfamethoxazole-Trimethoprim] Rash    Review of Systems  Constitutional: Negative for fever and malaise/fatigue.  HENT: Negative for congestion.   Eyes: Negative for blurred vision.  Respiratory: Negative for cough and shortness of breath.   Cardiovascular: Negative for chest pain, palpitations and leg swelling.  Gastrointestinal: Negative for vomiting.  Musculoskeletal: Negative for back pain.  Skin: Negative for rash.  Neurological: Negative for loss of consciousness and headaches.       Objective:    Physical Exam  Constitutional: He is oriented to person, place, and time. He appears well-developed and well-nourished. No distress.  HENT:  Head: Normocephalic and atraumatic.  Eyes: Conjunctivae are normal.  Neck: Normal range of motion. No thyromegaly present.  Cardiovascular: Normal rate and regular rhythm.   Pulmonary/Chest: Effort normal and breath sounds normal. He has no wheezes.  Abdominal: Soft. Bowel sounds are normal. There is no tenderness.  Musculoskeletal: He exhibits no edema or deformity.  Neurological: He is alert and oriented to person, place, and time.  Skin: Skin is warm and dry. He is not diaphoretic.  Psychiatric:  He has a normal mood and affect.    BP (!) 142/82   Pulse (!) 54   Temp 98.2 F (36.8 C) (Oral)   Wt 137 lb 3.2 oz (62.2 kg)   SpO2 97% Comment: RA  BMI 25.09 kg/m  Wt Readings from Last 3 Encounters:  09/06/16 137 lb 3.2 oz (62.2 kg)  07/02/16 137 lb 6.4 oz (62.3 kg)  04/29/16 133 lb 6 oz (60.5 kg)     Lab Results  Component Value Date   WBC 7.5 09/06/2016   HGB 15.1 09/06/2016   HCT 45.6 09/06/2016   PLT 174.0 09/06/2016   GLUCOSE 89 09/06/2016   CHOL 197  09/06/2016   TRIG 206.0 (H) 09/06/2016   HDL 41.70 09/06/2016   LDLDIRECT 124.0 09/06/2016   LDLCALC 98 04/17/2015   ALT 17 09/06/2016   AST 22 09/06/2016   NA 139 09/06/2016   K 4.8 09/06/2016   CL 102 09/06/2016   CREATININE 1.11 09/06/2016   BUN 13 09/06/2016   CO2 31 09/06/2016   TSH 2.34 09/06/2016   PSA 5.70 (H) 10/12/2011   HGBA1C 6.0 09/06/2016    Lab Results  Component Value Date   TSH 2.34 09/06/2016   Lab Results  Component Value Date   WBC 7.5 09/06/2016   HGB 15.1 09/06/2016   HCT 45.6 09/06/2016   MCV 84.5 09/06/2016   PLT 174.0 09/06/2016   Lab Results  Component Value Date   NA 139 09/06/2016   K 4.8 09/06/2016   CO2 31 09/06/2016   GLUCOSE 89 09/06/2016   BUN 13 09/06/2016   CREATININE 1.11 09/06/2016   BILITOT 0.6 09/06/2016   ALKPHOS 55 09/06/2016   AST 22 09/06/2016   ALT 17 09/06/2016   PROT 7.8 09/06/2016   ALBUMIN 4.3 09/06/2016   CALCIUM 9.6 09/06/2016   GFR 66.68 09/06/2016   Lab Results  Component Value Date   CHOL 197 09/06/2016   Lab Results  Component Value Date   HDL 41.70 09/06/2016   Lab Results  Component Value Date   LDLCALC 98 04/17/2015   Lab Results  Component Value Date   TRIG 206.0 (H) 09/06/2016   Lab Results  Component Value Date   CHOLHDL 5 09/06/2016   Lab Results  Component Value Date   HGBA1C 6.0 09/06/2016      I acted as a Education administrator for Dr. Charlett Blake. Raiford Noble, RMA  Assessment & Plan:   Problem List Items Addressed This Visit    Benign essential HTN    Adequate controlled, improved on recheck, no changes to meds. Encouraged heart healthy diet such as the DASH diet and exercise as tolerated.       Relevant Orders   CBC (Completed)   Comprehensive metabolic panel (Completed)   TSH (Completed)   Thrombocytopenia (HCC)    Check CBC today      Dermatitis    No recent flares.       Hyperglycemia     minimize simple carbs. Increase exercise as tolerated.       Relevant Orders    Hemoglobin A1c (Completed)   Hyperlipidemia, mild     encouraged heart healthy diet, avoid trans fats, minimize simple carbs and saturated fats. Increase exercise as tolerated      Relevant Orders   Lipid panel (Completed)   LDL cholesterol, direct (Completed)      I am having Mr. Streitz maintain his nebivolol and aspirin EC.  No orders of the defined types were placed  in this encounter.   CMA served as Education administrator during this visit. History, Physical and Plan performed by medical provider. Documentation and orders reviewed and attested to.  [name], MD

## 2016-09-06 NOTE — Assessment & Plan Note (Signed)
encouraged heart healthy diet, avoid trans fats, minimize simple carbs and saturated fats. Increase exercise as tolerated 

## 2016-09-06 NOTE — Progress Notes (Signed)
Patient ID: Steven Morrison, male   DOB: 07/06/1928, 83 y.o.   MRN: [id number]  

## 2016-09-06 NOTE — Assessment & Plan Note (Signed)
minimize simple carbs. Increase exercise as tolerated.  

## 2016-10-26 NOTE — Progress Notes (Signed)
Subjective:   Steven Morrison is a 83 y.o. male who presents for Medicare Annual/Subsequent preventive examination.  Review of Systems:  No ROS.  Medicare Wellness Visit. Cardiac Risk Factors include: advanced age (>38men, >31 women);dyslipidemia;hypertension;male gender Sleep patterns: Only sleeps in short increments. Has trouble staying asleep. Wakes 3-3 x to urinate.  Home Safety/Smoke Alarms: Feels safe in home. Smoke alarms in place.   Living environment; residence and Firearm Safety: Lives alone on 1st floor.  Seat Belt Safety/Bike Helmet: Wears seat belt.   Counseling:   Eye Exam- Wears glasses. Pt states he will schedule eye appt soon. Dental- No only goes as needed.  Male:   CCS-  n/a due to age PSA-  Lab Results  Component Value Date   PSA 5.70 (H) 10/12/2011        Objective:    Vitals: BP (!) 180/80 (BP Location: Right Arm, Patient Position: Sitting, Cuff Size: Large)   Pulse (!) 59   Ht 5\' 2"  (1.575 m)   Wt 138 lb (62.6 kg)   SpO2 98%   BMI 25.24 kg/m   Body mass index is 25.24 kg/m.  Tobacco History  Smoking Status  . Never Smoker  Smokeless Tobacco  . Never Used     Counseling given: No   Past Medical History:  Diagnosis Date  . Anxiety state, unspecified 12/19/2013  . Benign essential HTN 09/12/2011   Does not tolerate thiazide diuretics developed very bad dermatitis   . Constipation 02/10/2012  . Dermatitis 09/13/2012  . Elevated PSA 11/03/2011  . HTN (hypertension) 09/12/2011  . Hyperlipidemia, mild 04/17/2015  . Intertrigo 03/20/2012  . Medicare annual wellness visit, subsequent 09/12/2011  . Rash, skin 01/02/2012   Left lower leg  . Thrombocytopenia ([place]) 11/17/2011   slight  . Uncontrolled hypertension 09/12/2011  . Urinary hesitancy 10/12/2011  . Urinary urgency 10/12/2011   History reviewed. No pertinent surgical history. Family History  Problem Relation Age of Onset  . Migraines Daughter   . Allergies Daughter   . Allergies Son     History  Sexual Activity  . Sexual activity: No    Outpatient Encounter Prescriptions as of 10/28/2016  Medication Sig  . aspirin EC 81 MG tablet Take 1 tablet (81 mg total) by mouth daily.  . nebivolol (BYSTOLIC) 10 MG tablet TAKE 1/2 TABLET(5 MG) BY MOUTH TWICE DAILY   No facility-administered encounter medications on file as of 10/28/2016.     Activities of Daily Living In your present state of health, do you have any difficulty performing the following activities: 10/28/2016 04/29/2016  Hearing? N N  Vision? N N  Difficulty concentrating or making decisions? N N  Walking or climbing stairs? N N  Dressing or bathing? N N  Doing errands, shopping? N N  Preparing Food and eating ? N -  Using the Toilet? N -  In the past six months, have you accidently leaked urine? N -  Do you have problems with loss of bowel control? N -  Managing your Medications? N -  Managing your Finances? N -  Housekeeping or managing your Housekeeping? N -  Some recent data might be hidden    Patient Care Team: [name], MD as PCP - General (Family Medicine)   Assessment:    Physical assessment deferred to PCP.  Exercise Activities and Dietary recommendations Current Exercise Habits: Home exercise routine, Type of exercise: walking, Time (Minutes): 30, Frequency (Times/Week): 2, Weekly Exercise (Minutes/Week): 60, Intensity: Mild  Diet (meal preparation, eat out, water intake, caffeinated beverages, dairy products, fruits and vegetables): in general, a "healthy" diet    Goals    . 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      Fall Risk Fall Risk  10/28/2016 04/29/2016 04/17/2015  Falls in the past year? No No No   Depression Screen PHQ 2/9 Scores 10/28/2016 04/29/2016 04/17/2015  PHQ - 2 Score 0 0 0    Cognitive Function MMSE - Mini Mental State Exam 10/28/2016  Orientation to time 5  Orientation to Place 5  Registration 3  Attention/ Calculation 3   Recall 3  Language- name 2 objects 2  Language- repeat 1  Language- follow 3 step command 3  Language- read & follow direction 1  Write a sentence 1  Copy design 1  Total score 28        Immunization History  Administered Date(s) Administered  . Tdap 09/10/2011   Screening Tests Health Maintenance  Topic Date Due  . INFLUENZA VACCINE  11/13/2016 (Originally 03/16/2016)  . PNA vac Low Risk Adult (1 of 2 - PCV13) 10/28/2017 (Originally 03/13/1995)  . TETANUS/TDAP  09/09/2021      Plan:     Follow up with PCP today as scheduled.  Continue to eat heart healthy diet (full of fruits, vegetables, whole grains, lean protein, water--limit salt, fat, and sugar intake) and increase physical activity as tolerated.  Continue doing brain stimulating activities (puzzles, reading, adult coloring books, staying active) to keep memory sharp.    During the course of the visit the patient was educated and counseled about the following appropriate screening and preventive services:   Vaccines to include Pneumoccal, Influenza, Hepatitis B, Td, HCV  Cardiovascular Disease  Colorectal cancer screening  Diabetes screening  Prostate Cancer Screening  Glaucoma screening  Nutrition counseling    Patient Instructions (the written plan) was given to the patient.    Shela Nevin, [place]  10/28/2016

## 2016-10-26 NOTE — Progress Notes (Signed)
Pre visit review using our clinic review tool, if applicable. No additional management support is needed unless otherwise documented below in the visit note. 

## 2016-10-26 NOTE — Telephone Encounter (Signed)
AWV scheduled 10/28/16 @2 .

## 2016-10-28 ENCOUNTER — Ambulatory Visit (INDEPENDENT_AMBULATORY_CARE_PROVIDER_SITE_OTHER): Payer: Medicare Other | Admitting: Family Medicine

## 2016-10-28 ENCOUNTER — Encounter: Payer: Self-pay | Admitting: Family Medicine

## 2016-10-28 VITALS — BP 180/78 | HR 59 | Ht 62.0 in | Wt 138.0 lb

## 2016-10-28 DIAGNOSIS — E785 Hyperlipidemia, unspecified: Secondary | ICD-10-CM | POA: Diagnosis not present

## 2016-10-28 DIAGNOSIS — R739 Hyperglycemia, unspecified: Secondary | ICD-10-CM

## 2016-10-28 DIAGNOSIS — I10 Essential (primary) hypertension: Secondary | ICD-10-CM

## 2016-10-28 DIAGNOSIS — Z0000 Encounter for general adult medical examination without abnormal findings: Secondary | ICD-10-CM | POA: Diagnosis not present

## 2016-10-28 MED ORDER — LOSARTAN POTASSIUM 25 MG PO TABS: 25.0000 mg | ORAL_TABLET | Freq: Every day | ORAL | 3 refills | Status: DC

## 2016-10-28 NOTE — Assessment & Plan Note (Addendum)
bystolic taken today, losartan 25 mg daily restarted

## 2016-10-28 NOTE — Progress Notes (Signed)
Pre visit review using our clinic review tool, if applicable. No additional management support is needed unless otherwise documented below in the visit note. 

## 2016-10-28 NOTE — Progress Notes (Signed)
Patient ID: Steven Morrison, male   DOB: 1927-08-31, 83 y.o.   MRN: [id number]

## 2016-10-28 NOTE — Patient Instructions (Addendum)
Continue to eat heart healthy diet (full of fruits, vegetables, whole grains, lean protein, water--limit salt, fat, and sugar intake) and increase physical activity as tolerated.  Continue doing brain stimulating activities (puzzles, reading, adult coloring books, staying active) to keep memory sharp.   Hypertension Hypertension, commonly called high blood pressure, is when the force of blood pumping through the arteries is too strong. The arteries are the blood vessels that carry blood from the heart throughout the body. Hypertension forces the heart to work harder to pump blood and may cause arteries to become narrow or stiff. Having untreated or uncontrolled hypertension can cause heart attacks, strokes, kidney disease, and other problems. A blood pressure reading consists of a higher number over a lower number. Ideally, your blood pressure should be below 120/80. The first ("top") number is called the systolic pressure. It is a measure of the pressure in your arteries as your heart beats. The second ("bottom") number is called the diastolic pressure. It is a measure of the pressure in your arteries as the heart relaxes. What are the causes? The cause of this condition is not known. What increases the risk? Some risk factors for high blood pressure are under your control. Others are not. Factors you can change   Smoking.  Having type 2 diabetes mellitus, high cholesterol, or both.  Not getting enough exercise or physical activity.  Being overweight.  Having too much fat, sugar, calories, or salt (sodium) in your diet.  Drinking too much alcohol. Factors that are difficult or impossible to change   Having chronic kidney disease.  Having a family history of high blood pressure.  Age. Risk increases with age.  Race. You may be at higher risk if you are African-American.  Gender. Men are at higher risk than women before age 44. After age 53, women are at higher risk than  men.  Having obstructive sleep apnea.  Stress. What are the signs or symptoms? Extremely high blood pressure (hypertensive crisis) may cause:  Headache.  Anxiety.  Shortness of breath.  Nosebleed.  Nausea and vomiting.  Severe chest pain.  Jerky movements you cannot control (seizures). How is this diagnosed? This condition is diagnosed by measuring your blood pressure while you are seated, with your arm resting on a surface. The cuff of the blood pressure monitor will be placed directly against the skin of your upper arm at the level of your heart. It should be measured at least twice using the same arm. Certain conditions can cause a difference in blood pressure between your right and left arms. Certain factors can cause blood pressure readings to be lower or higher than normal (elevated) for a short period of time:  When your blood pressure is higher when you are in a health care provider's office than when you are at home, this is called white coat hypertension. Most people with this condition do not need medicines.  When your blood pressure is higher at home than when you are in a health care provider's office, this is called masked hypertension. Most people with this condition may need medicines to control blood pressure. If you have a high blood pressure reading during one visit or you have normal blood pressure with other risk factors:  You may be asked to return on a different day to have your blood pressure checked again.  You may be asked to monitor your blood pressure at home for 1 week or longer. If you are diagnosed with hypertension, you may have  other blood or imaging tests to help your health care provider understand your overall risk for other conditions. How is this treated? This condition is treated by making healthy lifestyle changes, such as eating healthy foods, exercising more, and reducing your alcohol intake. Your health care provider may prescribe medicine  if lifestyle changes are not enough to get your blood pressure under control, and if:  Your systolic blood pressure is above 130.  Your diastolic blood pressure is above 80. Your personal target blood pressure may vary depending on your medical conditions, your age, and other factors. Follow these instructions at home: Eating and drinking   Eat a diet that is high in fiber and potassium, and low in sodium, added sugar, and fat. An example eating plan is called the DASH (Dietary Approaches to Stop Hypertension) diet. To eat this way:  Eat plenty of fresh fruits and vegetables. Try to fill half of your plate at each meal with fruits and vegetables.  Eat whole grains, such as whole wheat pasta, brown rice, or whole grain bread. Fill about one quarter of your plate with whole grains.  Eat or drink low-fat dairy products, such as skim milk or low-fat yogurt.  Avoid fatty cuts of meat, processed or cured meats, and poultry with skin. Fill about one quarter of your plate with lean proteins, such as fish, chicken without skin, beans, eggs, and tofu.  Avoid premade and processed foods. These tend to be higher in sodium, added sugar, and fat.  Reduce your daily sodium intake. Most people with hypertension should eat less than 1,500 mg of sodium a day.  Limit alcohol intake to no more than 1 drink a day for nonpregnant women and 2 drinks a day for men. One drink equals 12 oz of beer, 5 oz of wine, or 1 oz of hard liquor. Lifestyle   Work with your health care provider to maintain a healthy body weight or to lose weight. Ask what an ideal weight is for you.  Get at least 30 minutes of exercise that causes your heart to beat faster (aerobic exercise) most days of the week. Activities may include walking, swimming, or biking.  Include exercise to strengthen your muscles (resistance exercise), such as pilates or lifting weights, as part of your weekly exercise routine. Try to do these types of  exercises for 30 minutes at least 3 days a week.  Do not use any products that contain nicotine or tobacco, such as cigarettes and e-cigarettes. If you need help quitting, ask your health care provider.  Monitor your blood pressure at home as told by your health care provider.  Keep all follow-up visits as told by your health care provider. This is important. Medicines   Take over-the-counter and prescription medicines only as told by your health care provider. Follow directions carefully. Blood pressure medicines must be taken as prescribed.  Do not skip doses of blood pressure medicine. Doing this puts you at risk for problems and can make the medicine less effective.  Ask your health care provider about side effects or reactions to medicines that you should watch for. Contact a health care provider if:  You think you are having a reaction to a medicine you are taking.  You have headaches that keep coming back (recurring).  You feel dizzy.  You have swelling in your ankles.  You have trouble with your vision. Get help right away if:  You develop a severe headache or confusion.  You have unusual weakness  or numbness.  You feel faint.  You have severe pain in your chest or abdomen.  You vomit repeatedly.  You have trouble breathing. Summary  Hypertension is when the force of blood pumping through your arteries is too strong. If this condition is not controlled, it may put you at risk for serious complications.  Your personal target blood pressure may vary depending on your medical conditions, your age, and other factors. For most people, a normal blood pressure is less than 120/80.  Hypertension is treated with lifestyle changes, medicines, or a combination of both. Lifestyle changes include weight loss, eating a healthy, low-sodium diet, exercising more, and limiting alcohol. This information is not intended to replace advice given to you by your health care provider.  Make sure you discuss any questions you have with your health care provider. Document Released: 08/02/2005 Document Revised: 06/30/2016 Document Reviewed: 06/30/2016 Elsevier Interactive Patient Education  2017 [employer].

## 2016-10-28 NOTE — Progress Notes (Signed)
Patient ID: Steven Morrison, male   DOB: Jul 14, 1928, 83 y.o.   MRN: [id number]   Subjective:  I acted as a Education administrator for [name], [place], [place]   Patient ID: Steven Morrison, male    DOB: 17-May-1928, 83 y.o.   MRN: [id number]  Chief Complaint  Patient presents with  . Medicare Wellness    With RN.  Marland Kitchen Hypertension  . Hyperglycemia    Hypertension  This is a chronic problem. The problem is controlled. Pertinent negatives include no blurred vision, chest pain, headaches, malaise/fatigue, palpitations or shortness of breath. Risk factors for coronary artery disease include male gender.  Hyperglycemia  This is a chronic problem. Pertinent negatives include no chest pain, congestion, coughing, fever, headaches, rash or vomiting.    Patient is in today for a Medicare wellness visit with the [facility] with a follow up with Provider. Patient has a Hx of HTN, hyperglycemia. Patient has no acute concerns noted at the moment. He continues to eat a heart healthy diet and stay physically active. No recent febrile illness or hospitalization. Denies CP/palp/SOB/HA/congestion/fevers/GI or GU c/o. Taking meds as prescribed  Patient Care Team: [name], MD as PCP - General (Family Medicine)   Past Medical History:  Diagnosis Date  . Anxiety state, unspecified 12/19/2013  . Benign essential HTN 09/12/2011   Does not tolerate thiazide diuretics developed very bad dermatitis   . Constipation 02/10/2012  . Dermatitis 09/13/2012  . Elevated PSA 11/03/2011  . HTN (hypertension) 09/12/2011  . Hyperlipidemia, mild 04/17/2015  . Intertrigo 03/20/2012  . Medicare annual wellness visit, subsequent 09/12/2011  . Rash, skin 01/02/2012   Left lower leg  . Thrombocytopenia ([place]) 11/17/2011   slight  . Uncontrolled hypertension 09/12/2011  . Urinary hesitancy 10/12/2011  . Urinary urgency 10/12/2011    History reviewed. No pertinent surgical history.  Family History  Problem Relation Age of  Onset  . Migraines Daughter   . Allergies Daughter   . Allergies Son     Social History   Social History  . Marital status: Divorced    Spouse name: N/A  . Number of children: N/A  . Years of education: N/A   Occupational History  . Not on file.   Social History Main Topics  . Smoking status: Never Smoker  . Smokeless tobacco: Never Used  . Alcohol use Yes     Comment: occasionally  . Drug use: No  . Sexual activity: No   Other Topics Concern  . Not on file   Social History Narrative  . No narrative on file    Outpatient Medications Prior to Visit  Medication Sig Dispense Refill  . aspirin EC 81 MG tablet Take 1 tablet (81 mg total) by mouth daily.    . nebivolol (BYSTOLIC) 10 MG tablet TAKE 1/2 TABLET(5 MG) BY MOUTH TWICE DAILY 90 tablet 1   No facility-administered medications prior to visit.     Allergies  Allergen Reactions  . Hctz [Hydrochlorothiazide] Rash  . Bactrim [Sulfamethoxazole-Trimethoprim] Rash    Review of Systems  Constitutional: Negative for fever and malaise/fatigue.  HENT: Negative for congestion.   Eyes: Negative for blurred vision.  Respiratory: Negative for cough and shortness of breath.   Cardiovascular: Negative for chest pain, palpitations and leg swelling.  Gastrointestinal: Negative for vomiting.  Musculoskeletal: Negative for back pain.  Skin: Negative for rash.  Neurological: Negative for loss of consciousness and headaches.       Objective:  Physical Exam  Constitutional: He is oriented to person, place, and time. He appears well-developed and well-nourished. No distress.  HENT:  Head: Normocephalic and atraumatic.  Eyes: Conjunctivae are normal.  Neck: Normal range of motion. No thyromegaly present.  Cardiovascular: Normal rate and regular rhythm.   Pulmonary/Chest: Effort normal and breath sounds normal. He has no wheezes.  Abdominal: Soft. Bowel sounds are normal. There is no tenderness.  Musculoskeletal: He  exhibits no edema or deformity.  Neurological: He is alert and oriented to person, place, and time.  Skin: Skin is warm and dry. He is not diaphoretic.  Psychiatric: He has a normal mood and affect.    BP (!) 180/78 (BP Location: Right Arm, Patient Position: Sitting, Cuff Size: Large)   Pulse (!) 59   Ht 5\' 2"  (1.575 m)   Wt 138 lb (62.6 kg)   SpO2 98%   BMI 25.24 kg/m  Wt Readings from Last 3 Encounters:  10/28/16 138 lb (62.6 kg)  09/06/16 137 lb 3.2 oz (62.2 kg)  07/02/16 137 lb 6.4 oz (62.3 kg)      Immunization History  Administered Date(s) Administered  . Tdap 09/10/2011    Health Maintenance  Topic Date Due  . INFLUENZA VACCINE  11/13/2016 (Originally 03/16/2016)  . PNA vac Low Risk Adult (1 of 2 - PCV13) 10/28/2017 (Originally 03/13/1995)  . TETANUS/TDAP  09/09/2021    Lab Results  Component Value Date   WBC 7.5 09/06/2016   HGB 15.1 09/06/2016   HCT 45.6 09/06/2016   PLT 174.0 09/06/2016   GLUCOSE 89 09/06/2016   CHOL 197 09/06/2016   TRIG 206.0 (H) 09/06/2016   HDL 41.70 09/06/2016   LDLDIRECT 124.0 09/06/2016   LDLCALC 98 04/17/2015   ALT 17 09/06/2016   AST 22 09/06/2016   NA 139 09/06/2016   K 4.8 09/06/2016   CL 102 09/06/2016   CREATININE 1.11 09/06/2016   BUN 13 09/06/2016   CO2 31 09/06/2016   TSH 2.34 09/06/2016   PSA 5.70 (H) 10/12/2011   HGBA1C 6.0 09/06/2016    Lab Results  Component Value Date   TSH 2.34 09/06/2016   Lab Results  Component Value Date   WBC 7.5 09/06/2016   HGB 15.1 09/06/2016   HCT 45.6 09/06/2016   MCV 84.5 09/06/2016   PLT 174.0 09/06/2016   Lab Results  Component Value Date   NA 139 09/06/2016   K 4.8 09/06/2016   CO2 31 09/06/2016   GLUCOSE 89 09/06/2016   BUN 13 09/06/2016   CREATININE 1.11 09/06/2016   BILITOT 0.6 09/06/2016   ALKPHOS 55 09/06/2016   AST 22 09/06/2016   ALT 17 09/06/2016   PROT 7.8 09/06/2016   ALBUMIN 4.3 09/06/2016   CALCIUM 9.6 09/06/2016   GFR 66.68 09/06/2016   Lab  Results  Component Value Date   CHOL 197 09/06/2016   Lab Results  Component Value Date   HDL 41.70 09/06/2016   Lab Results  Component Value Date   LDLCALC 98 04/17/2015   Lab Results  Component Value Date   TRIG 206.0 (H) 09/06/2016   Lab Results  Component Value Date   CHOLHDL 5 09/06/2016   Lab Results  Component Value Date   HGBA1C 6.0 09/06/2016         Assessment & Plan:   Problem List Items Addressed This Visit    Benign essential HTN    bystolic taken today, losartan 25 mg daily restarted      Relevant  Medications   losartan (COZAAR) 25 MG tablet   Hyperglycemia    minimize simple carbs. Increase exercise as tolerated.       Hyperlipidemia, mild    Encouraged heart healthy diet, increase exercise, avoid trans fats, consider a krill oil cap daily      Relevant Medications   losartan (COZAAR) 25 MG tablet    Other Visit Diagnoses    Encounter for Medicare annual wellness exam    -  Primary      I am having Mr. Meiner start on losartan. I am also having him maintain his nebivolol and aspirin EC.  Meds ordered this encounter  Medications  . losartan (COZAAR) 25 MG tablet    Sig: Take 1 tablet (25 mg total) by mouth daily.    Dispense:  30 tablet    Refill:  3    CMA served as scribe during this visit. History, Physical and Plan performed by medical provider. Documentation and orders reviewed and attested to.  [name], MD

## 2016-10-31 NOTE — Assessment & Plan Note (Signed)
Encouraged heart healthy diet, increase exercise, avoid trans fats, consider a krill oil cap daily 

## 2016-10-31 NOTE — Assessment & Plan Note (Signed)
minimize simple carbs. Increase exercise as tolerated.  

## 2016-11-18 ENCOUNTER — Telehealth: Payer: Self-pay | Admitting: Family Medicine

## 2016-11-18 MED ORDER — AMLODIPINE BESYLATE 2.5 MG PO TABS: 2.5000 mg | ORAL_TABLET | Freq: Every day | ORAL | 1 refills | Status: DC

## 2016-11-18 NOTE — Telephone Encounter (Signed)
Patient informed of change/updated list (added losartan to allergy/intolerance list) and sent in prescription.

## 2016-11-18 NOTE — Telephone Encounter (Signed)
Caller name: Relationship to patient: Self Can be reached: [phone]  Pharmacy:  Reason for call: Patient thinks he is having bad side effects from losartan (COZAAR) 25 MG tablet. Takes it at night before bed and feels that his HR increases and he gets a fever.

## 2016-11-18 NOTE — Telephone Encounter (Signed)
D/c losartan --- start norvasc 2.5 1 po qd--- ov with Dr Charlett Blake in 2-3 weeks

## 2016-11-18 NOTE — Telephone Encounter (Signed)
Patient has stopped taking losartan and symptoms are gone now. He stopped the medication this past weekend. Would like to know what PCP would recommend. He does not check his BP.  He states he feels better not checking it and gets a little anxious when checking it

## 2016-12-27 ENCOUNTER — Telehealth: Payer: Self-pay | Admitting: Family Medicine

## 2016-12-27 DIAGNOSIS — I10 Essential (primary) hypertension: Secondary | ICD-10-CM

## 2016-12-27 MED ORDER — NEBIVOLOL HCL 10 MG PO TABS: ORAL_TABLET | ORAL | 1 refills | Status: DC

## 2016-12-27 NOTE — Telephone Encounter (Signed)
Caller name: Relationship to patient: Self Can be reached: [phone]  Pharmacy:  [facility] [address] [place] [phone] ([phone] (Fax)     Reason for call: Refill nebivolol (BYSTOLIC) 10 MG tablet [[id number]]

## 2016-12-27 NOTE — Telephone Encounter (Signed)
Refill done and patient notified. 

## 2017-01-04 ENCOUNTER — Other Ambulatory Visit: Payer: Self-pay | Admitting: Family Medicine

## 2017-01-04 ENCOUNTER — Ambulatory Visit (INDEPENDENT_AMBULATORY_CARE_PROVIDER_SITE_OTHER): Payer: Medicare Other | Admitting: Family Medicine

## 2017-01-04 ENCOUNTER — Encounter: Payer: Self-pay | Admitting: Family Medicine

## 2017-01-04 VITALS — BP 176/78 | HR 54 | Temp 98.6°F | Wt 136.8 lb

## 2017-01-04 DIAGNOSIS — R5383 Other fatigue: Secondary | ICD-10-CM | POA: Diagnosis not present

## 2017-01-04 DIAGNOSIS — I10 Essential (primary) hypertension: Secondary | ICD-10-CM

## 2017-01-04 DIAGNOSIS — G479 Sleep disorder, unspecified: Secondary | ICD-10-CM

## 2017-01-04 DIAGNOSIS — G4700 Insomnia, unspecified: Secondary | ICD-10-CM

## 2017-01-04 DIAGNOSIS — F411 Generalized anxiety disorder: Secondary | ICD-10-CM

## 2017-01-04 DIAGNOSIS — E785 Hyperlipidemia, unspecified: Secondary | ICD-10-CM

## 2017-01-04 DIAGNOSIS — R739 Hyperglycemia, unspecified: Secondary | ICD-10-CM

## 2017-01-04 HISTORY — DX: Sleep disorder, unspecified: G47.9

## 2017-01-04 MED ORDER — CLONIDINE HCL 0.1 MG PO TABS: 0.1000 mg | ORAL_TABLET | Freq: Two times a day (BID) | ORAL | 1 refills | Status: DC

## 2017-01-04 NOTE — Progress Notes (Signed)
Patient ID: Steven Morrison, male   DOB: Jan 02, 1928, 83 y.o.   MRN: [id number]   Subjective:  I acted as a Education administrator for Steven Morrison, [place], [place]   Patient ID: Steven Morrison, male    DOB: [birth year], 83 y.o.   MRN: [id number]  Chief Complaint  Patient presents with  . Hyperglycemia    53-month F/U    Hyperglycemia  This is a chronic problem. Pertinent negatives include no chest pain, congestion, coughing, fever, headaches, rash or vomiting.  Hypertension  This is a chronic problem. The problem is resistant. Associated symptoms include malaise/fatigue. Pertinent negatives include no blurred vision, chest pain, headaches, palpitations or shortness of breath. Risk factors for coronary artery disease include male gender (hyperglycemia.).    Patient is in today for a 47-month follow up for hyperglycemia. Patient is still having ongoing concerns with hypertension. Patient has a Hx of HTN, hyperglycemia, insomnia, anxiety. Patient has no acute concerns noted at this time. He reports restless sleep, some snoring and fatigue. He endorses caffeine use regularly. Denies CP/palp/SOB/HA/congestion/fevers/GI or GU c/o. Taking meds as prescribed  Patient Care Team: [name], MD as PCP - General (Family Medicine)   Past Medical History:  Diagnosis Date  . Anxiety state, unspecified 12/19/2013  . Benign essential HTN 09/12/2011   Does not tolerate thiazide diuretics developed very bad dermatitis   . Constipation 02/10/2012  . Dermatitis 09/13/2012  . Elevated PSA 11/03/2011  . HTN (hypertension) 09/12/2011  . Hyperlipidemia, mild 04/17/2015  . Intertrigo 03/20/2012  . Medicare annual wellness visit, subsequent 09/12/2011  . Rash, skin 01/02/2012   Left lower leg  . Restless sleeper 01/04/2017  . Thrombocytopenia ([place]) 11/17/2011   slight  . Uncontrolled hypertension 09/12/2011  . Urinary hesitancy 10/12/2011  . Urinary urgency 10/12/2011    No past surgical history on file.  Family  History  Problem Relation Age of Onset  . Migraines Daughter   . Allergies Daughter   . Allergies Son     Social History   Social History  . Marital status: Divorced    Spouse name: N/A  . Number of children: N/A  . Years of education: N/A   Occupational History  . Not on file.   Social History Main Topics  . Smoking status: Never Smoker  . Smokeless tobacco: Never Used  . Alcohol use Yes     Comment: occasionally  . Drug use: No  . Sexual activity: No   Other Topics Concern  . Not on file   Social History Narrative  . No narrative on file    Outpatient Medications Prior to Visit  Medication Sig Dispense Refill  . amLODipine (NORVASC) 2.5 MG tablet Take 1 tablet (2.5 mg total) by mouth daily. 30 tablet 1  . aspirin EC 81 MG tablet Take 1 tablet (81 mg total) by mouth daily.    . nebivolol (BYSTOLIC) 10 MG tablet TAKE 1/2 TABLET(5 MG) BY MOUTH TWICE DAILY 90 tablet 1   No facility-administered medications prior to visit.     Allergies  Allergen Reactions  . Hctz [Hydrochlorothiazide] Rash  . Spironolactone   . Bactrim [Sulfamethoxazole-Trimethoprim] Rash  . Losartan Palpitations    Palpitations and fever    Review of Systems  Constitutional: Positive for malaise/fatigue. Negative for fever.  HENT: Negative for congestion.   Eyes: Negative for blurred vision.  Respiratory: Negative for cough and shortness of breath.   Cardiovascular: Negative for chest pain, palpitations and leg swelling.  Gastrointestinal:  Negative for vomiting.  Musculoskeletal: Negative for back pain.  Skin: Negative for rash.  Neurological: Negative for loss of consciousness and headaches.  Psychiatric/Behavioral: Negative for depression. The patient is nervous/anxious and has insomnia.        Objective:    Physical Exam  Constitutional: He is oriented to person, place, and time. He appears well-developed and well-nourished. No distress.  HENT:  Head: Normocephalic and  atraumatic.  Nose: Nose normal.  Eyes: Conjunctivae are normal. Right eye exhibits no discharge. Left eye exhibits no discharge.  Neck: Normal range of motion. Neck supple. No thyromegaly present.  Cardiovascular: Normal rate and regular rhythm.   No murmur heard. Pulmonary/Chest: Effort normal and breath sounds normal. He has no wheezes.  Abdominal: Soft. Bowel sounds are normal. There is no tenderness.  Musculoskeletal: He exhibits no edema or deformity.  Neurological: He is alert and oriented to person, place, and time.  Skin: Skin is warm and dry. He is not diaphoretic.  Psychiatric: He has a normal mood and affect.  Nursing note and vitals reviewed.   BP (!) 176/78   Pulse (!) 54   Temp 98.6 F (37 C) (Oral)   Wt 136 lb 12.8 oz (62.1 kg)   SpO2 100% Comment: RA  BMI 25.02 kg/m  Wt Readings from Last 3 Encounters:  01/04/17 136 lb 12.8 oz (62.1 kg)  10/28/16 138 lb (62.6 kg)  09/06/16 137 lb 3.2 oz (62.2 kg)   BP Readings from Last 3 Encounters:  01/04/17 (!) 176/78  10/28/16 (!) 180/78  09/08/16 (!) 142/82     Immunization History  Administered Date(s) Administered  . Tdap 09/10/2011    Health Maintenance  Topic Date Due  . PNA vac Low Risk Adult (1 of 2 - PCV13) 10/28/2017 (Originally 03/13/1995)  . INFLUENZA VACCINE  03/16/2017  . TETANUS/TDAP  09/09/2021    Lab Results  Component Value Date   WBC 7.5 09/06/2016   HGB 15.1 09/06/2016   HCT 45.6 09/06/2016   PLT 174.0 09/06/2016   GLUCOSE 89 09/06/2016   CHOL 197 09/06/2016   TRIG 206.0 (H) 09/06/2016   HDL 41.70 09/06/2016   LDLDIRECT 124.0 09/06/2016   LDLCALC 98 04/17/2015   ALT 17 09/06/2016   AST 22 09/06/2016   NA 139 09/06/2016   K 4.8 09/06/2016   CL 102 09/06/2016   CREATININE 1.11 09/06/2016   BUN 13 09/06/2016   CO2 31 09/06/2016   TSH 2.34 09/06/2016   PSA 5.70 (H) 10/12/2011   HGBA1C 6.0 09/06/2016    Lab Results  Component Value Date   TSH 2.34 09/06/2016   Lab Results    Component Value Date   WBC 7.5 09/06/2016   HGB 15.1 09/06/2016   HCT 45.6 09/06/2016   MCV 84.5 09/06/2016   PLT 174.0 09/06/2016   Lab Results  Component Value Date   NA 139 09/06/2016   K 4.8 09/06/2016   CO2 31 09/06/2016   GLUCOSE 89 09/06/2016   BUN 13 09/06/2016   CREATININE 1.11 09/06/2016   BILITOT 0.6 09/06/2016   ALKPHOS 55 09/06/2016   AST 22 09/06/2016   ALT 17 09/06/2016   PROT 7.8 09/06/2016   ALBUMIN 4.3 09/06/2016   CALCIUM 9.6 09/06/2016   GFR 66.68 09/06/2016   Lab Results  Component Value Date   CHOL 197 09/06/2016   Lab Results  Component Value Date   HDL 41.70 09/06/2016   Lab Results  Component Value Date   LDLCALC 98 04/17/2015  Lab Results  Component Value Date   TRIG 206.0 (H) 09/06/2016   Lab Results  Component Value Date   CHOLHDL 5 09/06/2016   Lab Results  Component Value Date   HGBA1C 6.0 09/06/2016         Assessment & Plan:   Problem List Items Addressed This Visit    Insomnia    Falls asleep well but wakes frequently and feels fatigued. Proceed with sleep study      Relevant Orders   Split night study   Hyperglycemia    hgba1c acceptable, minimize simple carbs. Increase exercise as tolerated.       Relevant Orders   Hemoglobin A1c   Anxiety state    Still stressors are getting to him but he feels he is doing fairly well. Does not want meds at this time. Declines therapy      Hyperlipidemia, mild    Encouraged heart healthy diet, increase exercise, avoid trans fats, consider a krill oil cap daily      Relevant Medications   cloNIDine (CATAPRES) 0.1 MG tablet   Other Relevant Orders   Lipid panel   Restless sleeper - Primary    Some snoring and restless sleep      Relevant Orders   Split night study    Other Visit Diagnoses    Other fatigue       Hypertension, unspecified type       Relevant Medications   cloNIDine (CATAPRES) 0.1 MG tablet   Other Relevant Orders   CBC   Comprehensive  metabolic panel   TSH      I am having Mr. Olivencia start on cloNIDine. I am also having him maintain his aspirin EC, amLODipine, and nebivolol.  Meds ordered this encounter  Medications  . cloNIDine (CATAPRES) 0.1 MG tablet    Sig: Take 1 tablet (0.1 mg total) by mouth 2 (two) times daily.    Dispense:  60 tablet    Refill:  1    CMA served as scribe during this visit. History, Physical and Plan performed by medical provider. Documentation and orders reviewed and attested to.  Steven Homans, MD

## 2017-01-04 NOTE — Assessment & Plan Note (Signed)
Poorly controlled will alter medications, encouraged DASH diet, minimize caffeine and obtain adequate sleep. Report concerning symptoms and follow up as directed and as needed 

## 2017-01-04 NOTE — Assessment & Plan Note (Signed)
Encouraged heart healthy diet, increase exercise, avoid trans fats, consider a krill oil cap daily 

## 2017-01-04 NOTE — Patient Instructions (Signed)
Hypertension °Hypertension is another name for high blood pressure. High blood pressure forces your heart to work harder to pump blood. This can cause problems over time. °There are two numbers in a blood pressure reading. There is a top number (systolic) over a bottom number (diastolic). It is best to have a blood pressure below 120/80. Healthy choices can help lower your blood pressure. You may need medicine to help lower your blood pressure if: °· Your blood pressure cannot be lowered with healthy choices. °· Your blood pressure is higher than 130/80. °Follow these instructions at home: °Eating and drinking  °· If directed, follow the DASH eating plan. This diet includes: °¨ Filling half of your plate at each meal with fruits and vegetables. °¨ Filling one quarter of your plate at each meal with whole grains. Whole grains include whole wheat pasta, brown rice, and whole grain bread. °¨ Eating or drinking low-fat dairy products, such as skim milk or low-fat yogurt. °¨ Filling one quarter of your plate at each meal with low-fat (lean) proteins. Low-fat proteins include fish, skinless chicken, eggs, beans, and tofu. °¨ Avoiding fatty meat, cured and processed meat, or chicken with skin. °¨ Avoiding premade or processed food. °· Eat less than 1,500 mg of salt (sodium) a day. °· Limit alcohol use to no more than 1 drink a day for nonpregnant women and 2 drinks a day for men. One drink equals 12 oz of beer, 5 oz of wine, or 1½ oz of hard liquor. °Lifestyle  °· Work with your doctor to stay at a healthy weight or to lose weight. Ask your doctor what the best weight is for you. °· Get at least 30 minutes of exercise that causes your heart to beat faster (aerobic exercise) most days of the week. This may include walking, swimming, or biking. °· Get at least 30 minutes of exercise that strengthens your muscles (resistance exercise) at least 3 days a week. This may include lifting weights or pilates. °· Do not use any  products that contain nicotine or tobacco. This includes cigarettes and e-cigarettes. If you need help quitting, ask your doctor. °· Check your blood pressure at home as told by your doctor. °· Keep all follow-up visits as told by your doctor. This is important. °Medicines  °· Take over-the-counter and prescription medicines only as told by your doctor. Follow directions carefully. °· Do not skip doses of blood pressure medicine. The medicine does not work as well if you skip doses. Skipping doses also puts you at risk for problems. °· Ask your doctor about side effects or reactions to medicines that you should watch for. °Contact a doctor if: °· You think you are having a reaction to the medicine you are taking. °· You have headaches that keep coming back (recurring). °· You feel dizzy. °· You have swelling in your ankles. °· You have trouble with your vision. °Get help right away if: °· You get a very bad headache. °· You start to feel confused. °· You feel weak or numb. °· You feel faint. °· You get very bad pain in your: °¨ Chest. °¨ Belly (abdomen). °· You throw up (vomit) more than once. °· You have trouble breathing. °Summary °· Hypertension is another name for high blood pressure. °· Making healthy choices can help lower blood pressure. If your blood pressure cannot be controlled with healthy choices, you may need to take medicine. °This information is not intended to replace advice given to you by your   health care provider. Make sure you discuss any questions you have with your health care provider. °Document Released: 01/19/2008 Document Revised: 06/30/2016 Document Reviewed: 06/30/2016 °Elsevier Interactive Patient Education © 2017 [employer] ° °

## 2017-01-04 NOTE — Assessment & Plan Note (Signed)
Some snoring and restless sleep

## 2017-01-04 NOTE — Progress Notes (Signed)
Pre visit review using our clinic review tool, if applicable. No additional management support is needed unless otherwise documented below in the visit note. 

## 2017-01-04 NOTE — Assessment & Plan Note (Signed)
hgba1c acceptable, minimize simple carbs. Increase exercise as tolerated.  

## 2017-01-04 NOTE — Assessment & Plan Note (Signed)
Still stressors are getting to him but he feels he is doing fairly well. Does not want meds at this time. Declines therapy

## 2017-01-04 NOTE — Assessment & Plan Note (Signed)
Falls asleep well but wakes frequently and feels fatigued. Proceed with sleep study

## 2017-01-31 ENCOUNTER — Ambulatory Visit: Payer: Medicare Other | Admitting: Family Medicine

## 2017-03-30 IMAGING — CT CT HEAD W/O CM
3 series · 15 of 47 positions shown, 18 images · non-contrast
Comparison: None.

CLINICAL DATA: Vertigo.

EXAM:
CT HEAD WITHOUT CONTRAST
TECHNIQUE: Contiguous axial images were obtained from the base of the skull
through the vertex without intravenous contrast.

[Series 2: head wo · axial · 0.41mm/px · z∈[-169,-39]mm · 9 of 32 slices shown, 12 images]
[im 3/32  brain]
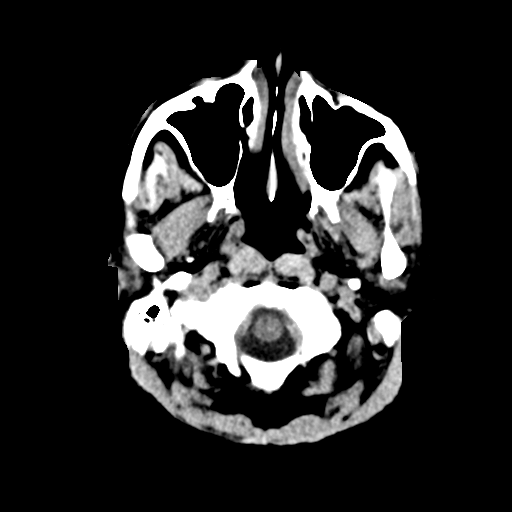
[im 3/32  bone]
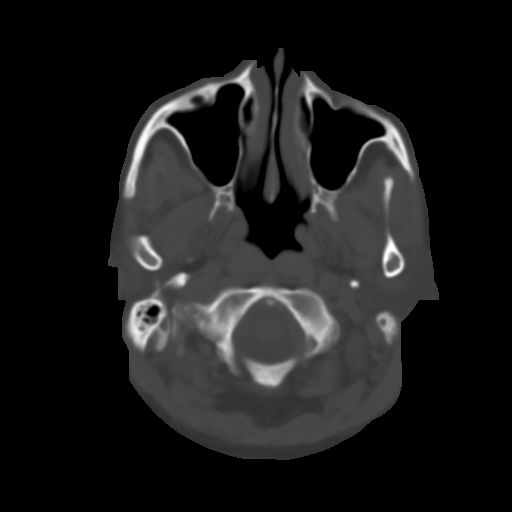
[im 6/32  brain]
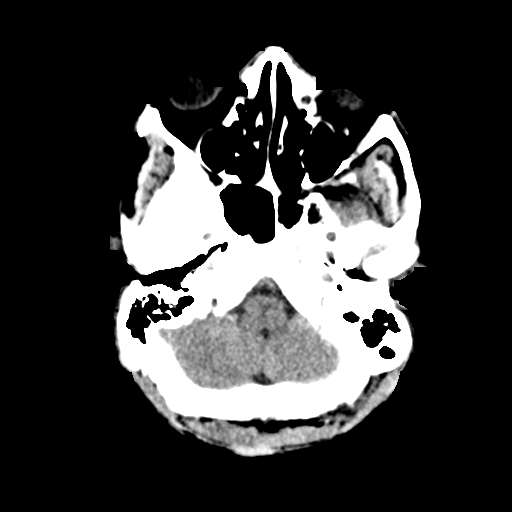
[im 9/32  brain]
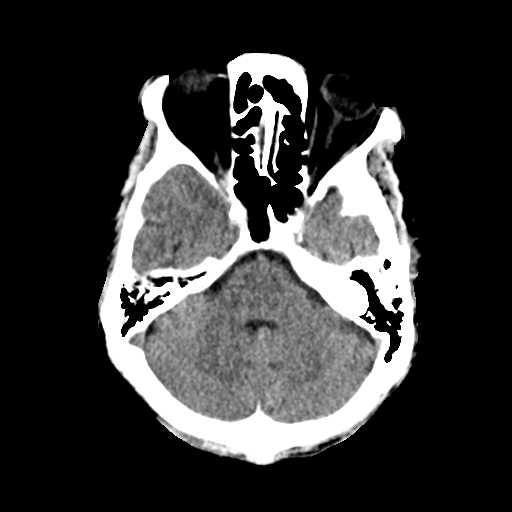
[im 12/32  brain]
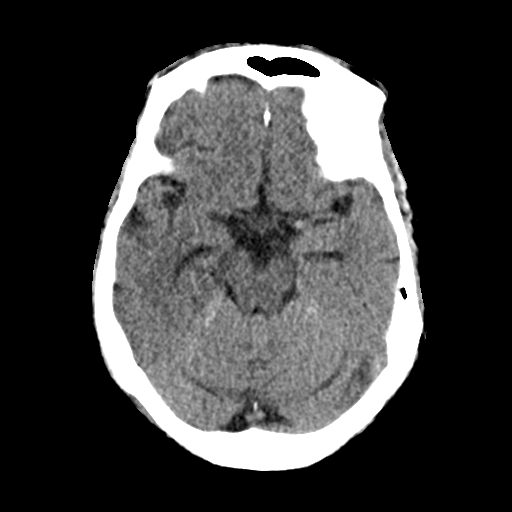
[im 17/32  brain]
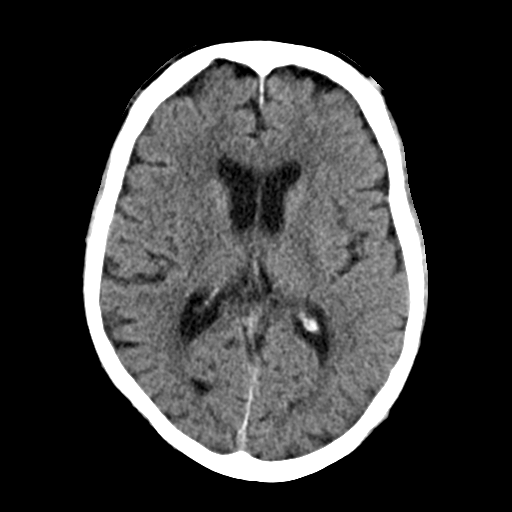
[im 17/32  bone]
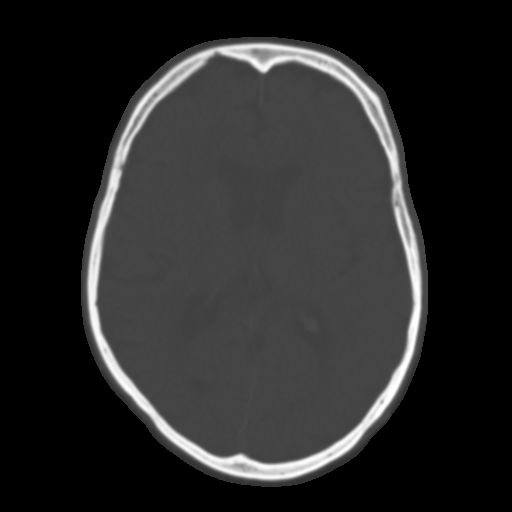
[im 20/32  brain]
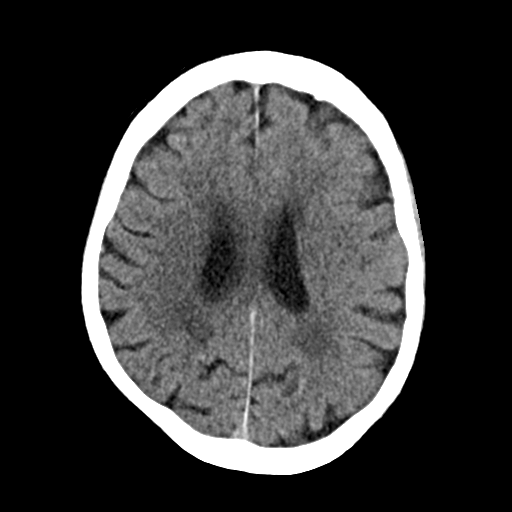
[im 23/32  brain]
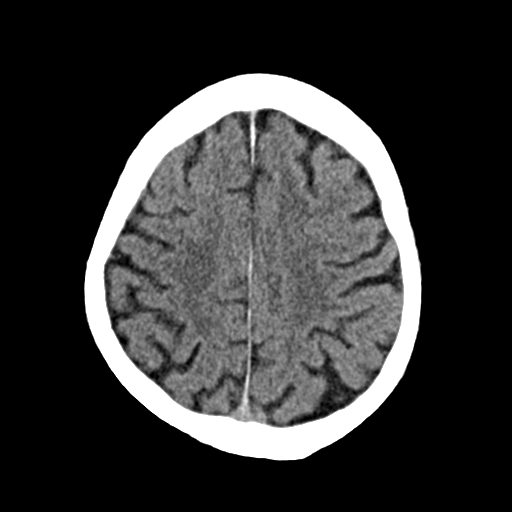
[im 26/32  brain]
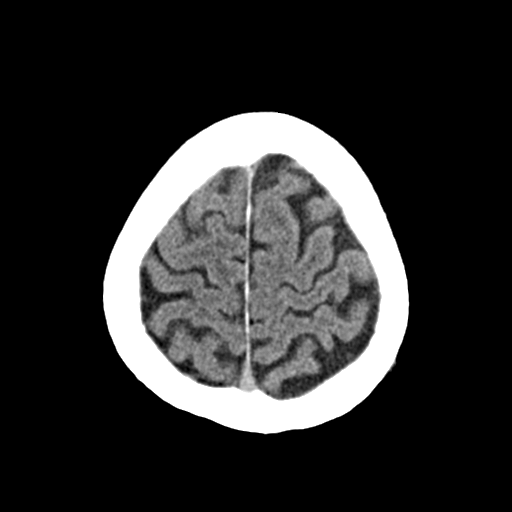
[im 29/32  brain]
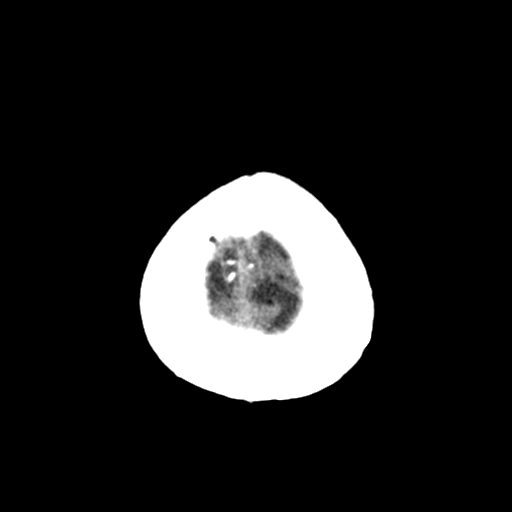
[im 29/32  bone]
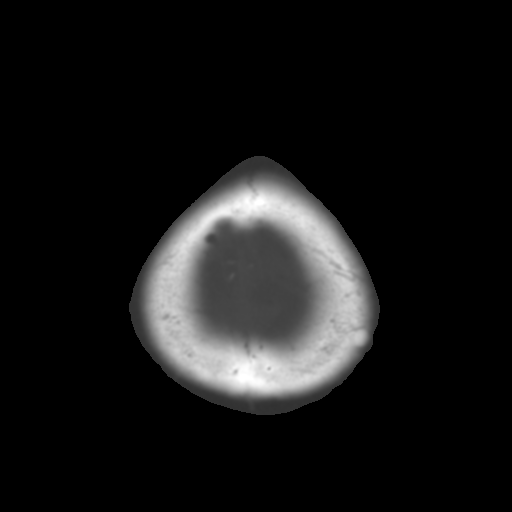

[Series 4: coronal soft · coronal · 0.35mm/px · 3 of 62 slices shown]
[im 21/62  brain]
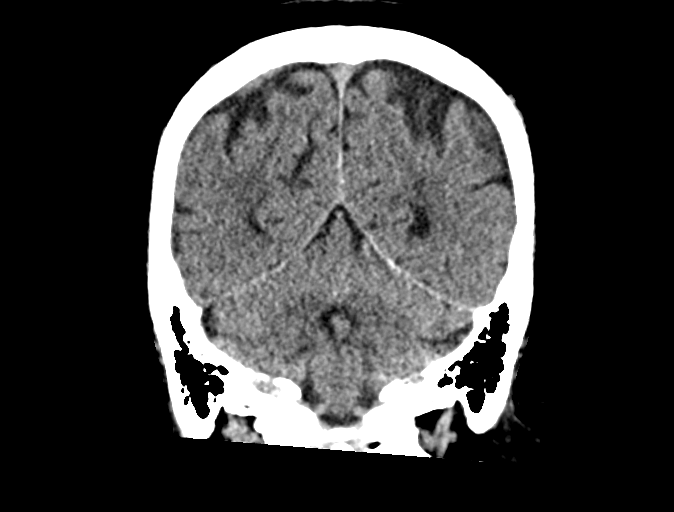
[im 28/62  brain]
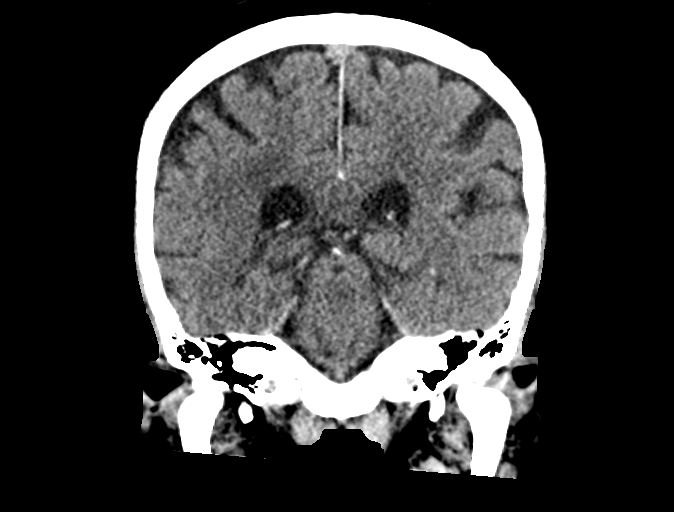
[im 34/62  brain]
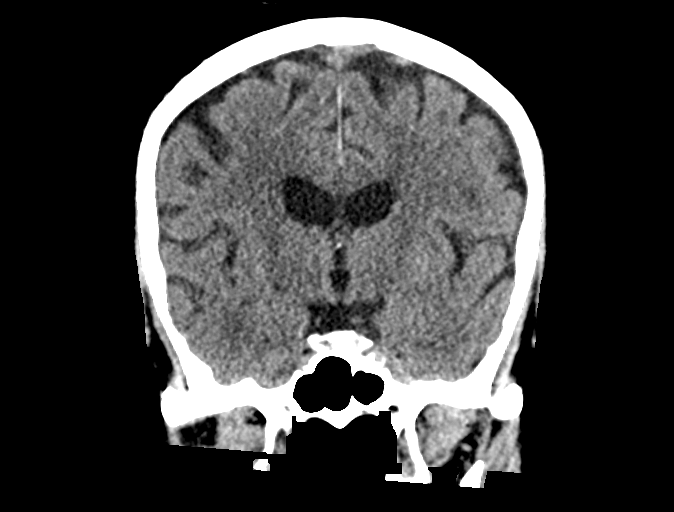

[Series 5: sag soft · sagittal · 0.34mm/px · 3 of 51 slices shown]
[im 17/51  brain]
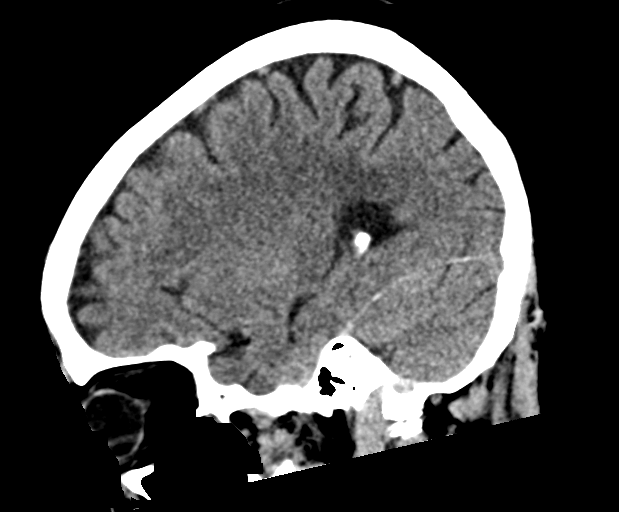
[im 26/51  brain]
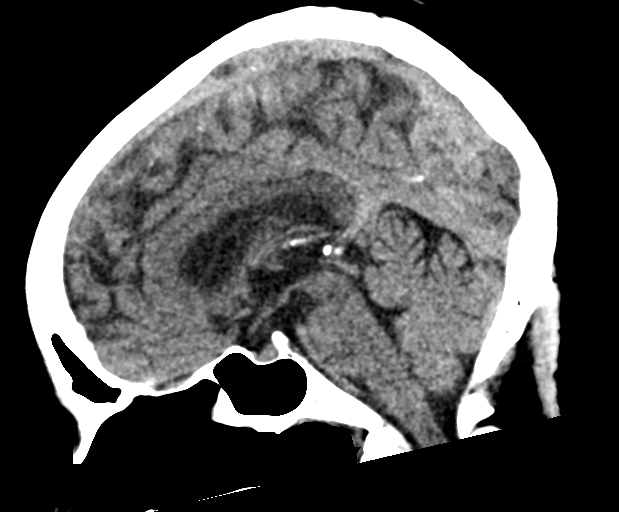
[im 34/51  brain]
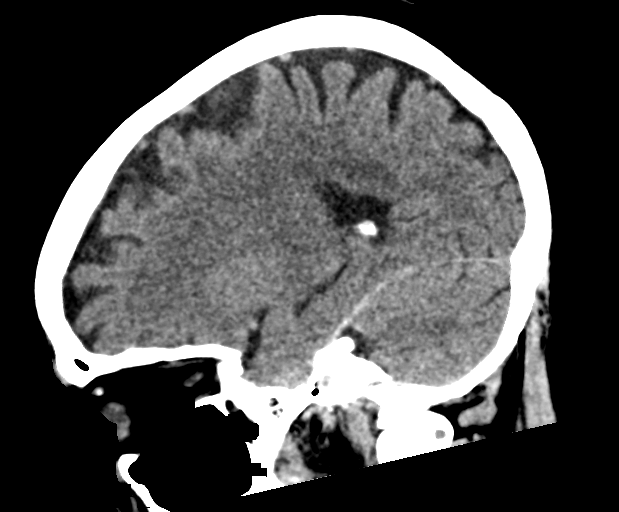

[15 of 47 positions shown; findings below may reference images not displayed]

FINDINGS: Brain: No acute intracranial abnormality. Specifically, no
hemorrhage, hydrocephalus, mass lesion, acute infarction, or
significant intracranial injury.

Vascular: No hyperdense vessel or unexpected calcification.

Skull: No acute calvarial abnormality.

Sinuses/Orbits: Visualized paranasal sinuses and mastoids clear.
Orbital soft tissues unremarkable.

Other: None
IMPRESSION: No acute intracranial abnormality.

## 2017-04-08 ENCOUNTER — Encounter: Payer: Self-pay | Admitting: Family Medicine

## 2017-04-08 ENCOUNTER — Ambulatory Visit: Payer: Medicare Other | Admitting: Family Medicine

## 2017-04-08 ENCOUNTER — Encounter (HOSPITAL_BASED_OUTPATIENT_CLINIC_OR_DEPARTMENT_OTHER): Payer: Medicare Other

## 2017-04-08 ENCOUNTER — Ambulatory Visit (INDEPENDENT_AMBULATORY_CARE_PROVIDER_SITE_OTHER): Payer: Medicare Other | Admitting: Family Medicine

## 2017-04-08 DIAGNOSIS — E785 Hyperlipidemia, unspecified: Secondary | ICD-10-CM | POA: Diagnosis not present

## 2017-04-08 DIAGNOSIS — R739 Hyperglycemia, unspecified: Secondary | ICD-10-CM

## 2017-04-08 DIAGNOSIS — K5900 Constipation, unspecified: Secondary | ICD-10-CM

## 2017-04-08 DIAGNOSIS — I10 Essential (primary) hypertension: Secondary | ICD-10-CM

## 2017-04-08 NOTE — Assessment & Plan Note (Addendum)
hgba1c acceptable, minimize simple carbs. Increase exercise as tolerated.  

## 2017-04-08 NOTE — Assessment & Plan Note (Signed)
Add miralax and benefiber once or twice daily. Encouraged increased hydration and fiber in diet. Daily probiotics. If bowels not moving can use MOM 2 tbls po in 4 oz of warm prune juice by mouth every 2-3 days. If no results then repeat in 4 hours with  Dulcolax suppository pr, may repeat again in 4 more hours as needed. Seek care if symptoms worsen. Consider daily Miralax and/or Dulcolax if symptoms persist.

## 2017-04-08 NOTE — Assessment & Plan Note (Signed)
Encouraged heart healthy diet, increase exercise, avoid trans fats, consider a krill oil cap daily 

## 2017-04-08 NOTE — Progress Notes (Signed)
Subjective:  I acted as a Education administrator for Dr. Charlett Blake. [place], [place]  Patient ID: Steven Morrison, male    DOB: 03-05-1928, 83 y.o.   MRN: [id number]  Chief Complaint  Patient presents with  . Follow-up    Patient is here today for a follow-up.He states that the Amlodipine came up as no refills so he thinks it was a temporary medication.    HPI  Patient is in today for a follow up. He is following up on his hypertension and other medical concerns. No recent febrile illness or acute hospitalizations. Denies CP/palp/SOB/HA/congestion/fevers/GI or GU c/o. Taking meds as prescribed    Patient Care Team: [name], MD as PCP - General (Family Medicine)   Past Medical History:  Diagnosis Date  . Anxiety state, unspecified 12/19/2013  . Benign essential HTN 09/12/2011   Does not tolerate thiazide diuretics developed very bad dermatitis   . Constipation 02/10/2012  . Dermatitis 09/13/2012  . Elevated PSA 11/03/2011  . HTN (hypertension) 09/12/2011  . Hyperlipidemia, mild 04/17/2015  . Intertrigo 03/20/2012  . Medicare annual wellness visit, subsequent 09/12/2011  . Rash, skin 01/02/2012   Left lower leg  . Restless sleeper 01/04/2017  . Thrombocytopenia ([place]) 11/17/2011   slight  . Uncontrolled hypertension 09/12/2011  . Urinary hesitancy 10/12/2011  . Urinary urgency 10/12/2011    No past surgical history on file.  Family History  Problem Relation Age of Onset  . Migraines Daughter   . Allergies Daughter   . Allergies Son     Social History   Social History  . Marital status: Divorced    Spouse name: N/A  . Number of children: N/A  . Years of education: N/A   Occupational History  . Not on file.   Social History Main Topics  . Smoking status: Never Smoker  . Smokeless tobacco: Never Used  . Alcohol use Yes     Comment: occasionally  . Drug use: No  . Sexual activity: No   Other Topics Concern  . Not on file   Social History Narrative  . No narrative on file     Outpatient Medications Prior to Visit  Medication Sig Dispense Refill  . aspirin EC 81 MG tablet Take 1 tablet (81 mg total) by mouth daily.    . cloNIDine (CATAPRES) 0.1 MG tablet TAKE 1 TABLET(0.1 MG) BY MOUTH TWICE DAILY 180 tablet 1  . nebivolol (BYSTOLIC) 10 MG tablet TAKE 1/2 TABLET(5 MG) BY MOUTH TWICE DAILY 90 tablet 1  . amLODipine (NORVASC) 2.5 MG tablet Take 1 tablet (2.5 mg total) by mouth daily. (Patient not taking: Reported on 04/08/2017) 30 tablet 1   No facility-administered medications prior to visit.     Allergies  Allergen Reactions  . Hctz [Hydrochlorothiazide] Rash  . Spironolactone   . Bactrim [Sulfamethoxazole-Trimethoprim] Rash  . Losartan Palpitations    Palpitations and fever    Review of Systems  Constitutional: Negative for fever and malaise/fatigue.  HENT: Negative for congestion.   Eyes: Negative for blurred vision.  Respiratory: Negative for cough and shortness of breath.   Cardiovascular: Negative for chest pain, palpitations and leg swelling.  Gastrointestinal: Negative for vomiting.  Musculoskeletal: Negative for back pain.  Skin: Negative for rash.  Neurological: Negative for loss of consciousness and headaches.       Objective:    Physical Exam  Constitutional: He is oriented to person, place, and time. He appears well-developed and well-nourished. No distress.  HENT:  Head: Normocephalic  and atraumatic.  Eyes: Conjunctivae are normal.  Neck: Normal range of motion. No thyromegaly present.  Cardiovascular: Normal rate and regular rhythm.   Pulmonary/Chest: Effort normal and breath sounds normal. He has no wheezes.  Abdominal: Soft. Bowel sounds are normal. There is no tenderness.  Musculoskeletal: Normal range of motion. He exhibits no edema or deformity.  Neurological: He is alert and oriented to person, place, and time.  Skin: Skin is warm and dry. He is not diaphoretic.  Psychiatric: He has a normal mood and affect.    BP  124/78 (BP Location: Left Arm, Patient Position: Sitting, Cuff Size: Normal)   Pulse (!) 49   Temp 97.8 F (36.6 C) (Oral)   Ht 5\' 2"  (1.575 m)   Wt 136 lb 6.4 oz (61.9 kg)   SpO2 98%   BMI 24.95 kg/m  Wt Readings from Last 3 Encounters:  04/08/17 136 lb 6.4 oz (61.9 kg)  01/04/17 136 lb 12.8 oz (62.1 kg)  10/28/16 138 lb (62.6 kg)   BP Readings from Last 3 Encounters:  04/08/17 124/78  01/04/17 (!) 176/78  10/28/16 (!) 180/78     Immunization History  Administered Date(s) Administered  . Tdap 09/10/2011    Health Maintenance  Topic Date Due  . INFLUENZA VACCINE  03/16/2017  . PNA vac Low Risk Adult (1 of 2 - PCV13) 10/28/2017 (Originally 03/13/1995)  . TETANUS/TDAP  09/09/2021    Lab Results  Component Value Date   WBC 7.5 09/06/2016   HGB 15.1 09/06/2016   HCT 45.6 09/06/2016   PLT 174.0 09/06/2016   GLUCOSE 89 09/06/2016   CHOL 197 09/06/2016   TRIG 206.0 (H) 09/06/2016   HDL 41.70 09/06/2016   LDLDIRECT 124.0 09/06/2016   LDLCALC 98 04/17/2015   ALT 17 09/06/2016   AST 22 09/06/2016   NA 139 09/06/2016   K 4.8 09/06/2016   CL 102 09/06/2016   CREATININE 1.11 09/06/2016   BUN 13 09/06/2016   CO2 31 09/06/2016   TSH 2.34 09/06/2016   PSA 5.70 (H) 10/12/2011   HGBA1C 6.0 09/06/2016    Lab Results  Component Value Date   TSH 2.34 09/06/2016   Lab Results  Component Value Date   WBC 7.5 09/06/2016   HGB 15.1 09/06/2016   HCT 45.6 09/06/2016   MCV 84.5 09/06/2016   PLT 174.0 09/06/2016   Lab Results  Component Value Date   NA 139 09/06/2016   K 4.8 09/06/2016   CO2 31 09/06/2016   GLUCOSE 89 09/06/2016   BUN 13 09/06/2016   CREATININE 1.11 09/06/2016   BILITOT 0.6 09/06/2016   ALKPHOS 55 09/06/2016   AST 22 09/06/2016   ALT 17 09/06/2016   PROT 7.8 09/06/2016   ALBUMIN 4.3 09/06/2016   CALCIUM 9.6 09/06/2016   GFR 66.68 09/06/2016   Lab Results  Component Value Date   CHOL 197 09/06/2016   Lab Results  Component Value Date   HDL  41.70 09/06/2016   Lab Results  Component Value Date   LDLCALC 98 04/17/2015   Lab Results  Component Value Date   TRIG 206.0 (H) 09/06/2016   Lab Results  Component Value Date   CHOLHDL 5 09/06/2016   Lab Results  Component Value Date   HGBA1C 6.0 09/06/2016         Assessment & Plan:   Problem List Items Addressed This Visit    Benign essential HTN    Well controlled, no changes to meds. Encouraged heart healthy  diet such as the DASH diet and exercise as tolerated.       Relevant Orders   CBC   Comprehensive metabolic panel   TSH   Constipation    Add miralax and benefiber once or twice daily. Encouraged increased hydration and fiber in diet. Daily probiotics. If bowels not moving can use MOM 2 tbls po in 4 oz of warm prune juice by mouth every 2-3 days. If no results then repeat in 4 hours with  Dulcolax suppository pr, may repeat again in 4 more hours as needed. Seek care if symptoms worsen. Consider daily Miralax and/or Dulcolax if symptoms persist.       Hyperglycemia    hgba1c acceptable, minimize simple carbs. Increase exercise as tolerated.       Relevant Orders   Hemoglobin A1c   Hyperlipidemia, mild    Encouraged heart healthy diet, increase exercise, avoid trans fats, consider a krill oil cap daily      Relevant Orders   Lipid panel      I am having Mr. Puls maintain his aspirin EC, amLODipine, nebivolol, and cloNIDine.  No orders of the defined types were placed in this encounter.   CMA served as Education administrator during this visit. History, Physical and Plan performed by medical provider. Documentation and orders reviewed and attested to.  [name], MD

## 2017-04-08 NOTE — Assessment & Plan Note (Signed)
Well controlled, no changes to meds. Encouraged heart healthy diet such as the DASH diet and exercise as tolerated.  °

## 2017-04-08 NOTE — Patient Instructions (Signed)
Encouraged increased hydration and fiber in diet. Daily probiotics. If bowels not moving can use MOM 2 tbls po in 4 oz of warm prune juice by mouth every 2-3 days. If no results then repeat in 4 hours with  Dulcolax suppository pr, may repeat again in 4 more hours as needed. Seek care if symptoms worsen. Consider daily Miralax and/or Dulcolax if symptoms persist.  Add Miralax and Benefiber in liquid daily if still constipated try recipe above Split amlodipine to 1/2 tab twice daily  Constipation, Adult Constipation is when a person:  Poops (has a bowel movement) fewer times in a week than normal.  Has a hard time pooping.  Has poop that is dry, hard, or bigger than normal.  Follow these instructions at home: Eating and drinking   Eat foods that have a lot of fiber, such as: ? Fresh fruits and vegetables. ? Whole grains. ? Beans.  Eat less of foods that are high in fat, low in fiber, or overly processed, such as: ? [place] fries. ? Hamburgers. ? Cookies. ? Candy. ? Soda.  Drink enough fluid to keep your pee (urine) clear or pale yellow. General instructions  Exercise regularly or as told by your doctor.  Go to the restroom when you feel like you need to poop. Do not hold it in.  Take over-the-counter and prescription medicines only as told by your doctor. These include any fiber supplements.  Do pelvic floor retraining exercises, such as: ? Doing deep breathing while relaxing your lower belly (abdomen). ? Relaxing your pelvic floor while pooping.  Watch your condition for any changes.  Keep all follow-up visits as told by your doctor. This is important. Contact a doctor if:  You have pain that gets worse.  You have a fever.  You have not pooped for 4 days.  You throw up (vomit).  You are not hungry.  You lose weight.  You are bleeding from the anus.  You have thin, pencil-like poop (stool). Get help right away if:  You have a fever, and your symptoms  suddenly get worse.  You leak poop or have blood in your poop.  Your belly feels hard or bigger than normal (is bloated).  You have very bad belly pain.  You feel dizzy or you faint. This information is not intended to replace advice given to you by your health care provider. Make sure you discuss any questions you have with your health care provider. Document Released: 01/19/2008 Document Revised: 02/20/2016 Document Reviewed: 01/21/2016 Elsevier Interactive Patient Education  2017 [employer].

## 2017-05-02 ENCOUNTER — Encounter: Payer: Self-pay | Admitting: Family Medicine

## 2017-10-18 ENCOUNTER — Ambulatory Visit (INDEPENDENT_AMBULATORY_CARE_PROVIDER_SITE_OTHER): Payer: Medicare Other | Admitting: Family Medicine

## 2017-10-18 ENCOUNTER — Telehealth: Payer: Self-pay | Admitting: Family Medicine

## 2017-10-18 DIAGNOSIS — I10 Essential (primary) hypertension: Secondary | ICD-10-CM

## 2017-10-18 DIAGNOSIS — G4700 Insomnia, unspecified: Secondary | ICD-10-CM

## 2017-10-18 DIAGNOSIS — D696 Thrombocytopenia, unspecified: Secondary | ICD-10-CM

## 2017-10-18 DIAGNOSIS — E785 Hyperlipidemia, unspecified: Secondary | ICD-10-CM | POA: Diagnosis not present

## 2017-10-18 DIAGNOSIS — R739 Hyperglycemia, unspecified: Secondary | ICD-10-CM

## 2017-10-18 DIAGNOSIS — R972 Elevated prostate specific antigen [PSA]: Secondary | ICD-10-CM

## 2017-10-18 LAB — COMPREHENSIVE METABOLIC PANEL
ALBUMIN: 4.2 g/dL (ref 3.5–5.2)
ALK PHOS: 49 U/L (ref 39–117)
ALT: 13 U/L (ref 0–53)
AST: 17 U/L (ref 0–37)
BUN: 15 mg/dL (ref 6–23)
CO2: 31 mEq/L (ref 19–32)
CREATININE: 1.22 mg/dL (ref 0.40–1.50)
Calcium: 9.6 mg/dL (ref 8.4–10.5)
Chloride: 103 mEq/L (ref 96–112)
GFR: 59.64 mL/min — ABNORMAL LOW (ref >60.00)
GLUCOSE: 107 mg/dL — AB (ref 70–99)
Potassium: 4.8 mEq/L (ref 3.5–5.1)
SODIUM: 138 meq/L (ref 135–145)
TOTAL PROTEIN: 7.5 g/dL (ref 6.0–8.3)
Total Bilirubin: 0.6 mg/dL (ref 0.2–1.2)

## 2017-10-18 LAB — HEMOGLOBIN A1C: HEMOGLOBIN A1C: 6.1 % (ref 4.6–6.5)

## 2017-10-18 LAB — LIPID PANEL
Cholesterol: 167 mg/dL (ref 0–200)
HDL: 41.9 mg/dL (ref >39.00)
LDL Cholesterol: 102 mg/dL — ABNORMAL HIGH (ref 0–99)
NONHDL: 125.09
Total CHOL/HDL Ratio: 4
Triglycerides: 114 mg/dL (ref 0.0–149.0)
VLDL: 22.8 mg/dL (ref 0.0–40.0)

## 2017-10-18 LAB — CBC
HCT: 43.5 % (ref 39.0–52.0)
Hemoglobin: 14.1 g/dL (ref 13.0–17.0)
MCHC: 32.5 g/dL (ref 30.0–36.0)
MCV: 84.8 fl (ref 78.0–100.0)
Platelets: 150 10*3/uL (ref 150.0–400.0)
RBC: 5.13 Mil/uL (ref 4.22–5.81)
RDW: 14.7 % (ref 11.5–15.5)
WBC: 6.1 10*3/uL (ref 4.0–10.5)

## 2017-10-18 LAB — TSH: TSH: 2.07 u[IU]/mL (ref 0.35–4.50)

## 2017-10-18 MED ORDER — NEBIVOLOL HCL 10 MG PO TABS: ORAL_TABLET | ORAL | 1 refills | Status: DC

## 2017-10-18 MED ORDER — NEBIVOLOL HCL 10 MG PO TABS: 5.0000 mg | ORAL_TABLET | Freq: Three times a day (TID) | ORAL | 1 refills | Status: DC

## 2017-10-18 MED ORDER — TRIAMCINOLONE ACETONIDE 0.1 % EX CREA: 1.0000 "application " | TOPICAL_CREAM | Freq: Two times a day (BID) | CUTANEOUS | 1 refills | Status: DC

## 2017-10-18 MED ORDER — NEBIVOLOL HCL 10 MG PO TABS: 10.0000 mg | ORAL_TABLET | Freq: Three times a day (TID) | ORAL | 0 refills | Status: DC

## 2017-10-18 NOTE — Assessment & Plan Note (Addendum)
Elevated on arrival because he got lost on the way here. Did improve some on recheck but remained elevated. He can increase his Bystolic to tid if his numbers remain elevated and let us know.

## 2017-10-18 NOTE — Telephone Encounter (Signed)
Copied from [place] [phone]. Topic: Quick Communication - See Telephone Encounter >> Oct 18, 2017  3:47 PM Synthia Innocent wrote: CRM for notification. See Telephone encounter for:  Need clarification on dosage of nebivolol (BYSTOLIC) 10 MG tablet  87/68/11.

## 2017-10-18 NOTE — Progress Notes (Signed)
Subjective:  I acted as a Education administrator for Dr. Charlett Blake. [place], [place]  Patient ID: Steven Morrison, male    DOB: June 26, 1928, 84 y.o.   MRN: [id number]  No chief complaint on file.   HPI  Patient is in today for follow up on chronic medical concerns and he reports feeling well. No recent febrile illness or hospitalizations. No acute concerns. He admits to getting lost on his way here due to a new highway and he thinks that is why his BP is up. Denies CP/palp/SOB/HA/congestion/fevers/GI or GU c/o. Taking meds as prescribed. He denies any acute concerns and reports he continues to stay active and eat a heart healthy diet.   Patient Care Team: [name], MD as PCP - General (Family Medicine)   Past Medical History:  Diagnosis Date  . Anxiety state, unspecified 12/19/2013  . Benign essential HTN 09/12/2011   Does not tolerate thiazide diuretics developed very bad dermatitis   . Constipation 02/10/2012  . Dermatitis 09/13/2012  . Elevated PSA 11/03/2011  . HTN (hypertension) 09/12/2011  . Hyperlipidemia, mild 04/17/2015  . Intertrigo 03/20/2012  . Medicare annual wellness visit, subsequent 09/12/2011  . Rash, skin 01/02/2012   Left lower leg  . Restless sleeper 01/04/2017  . Thrombocytopenia ([place]) 11/17/2011   slight  . Uncontrolled hypertension 09/12/2011  . Urinary hesitancy 10/12/2011  . Urinary urgency 10/12/2011    No past surgical history on file.  Family History  Problem Relation Age of Onset  . Migraines Daughter   . Allergies Daughter   . Allergies Son     Social History   Socioeconomic History  . Marital status: Divorced    Spouse name: Not on file  . Number of children: Not on file  . Years of education: Not on file  . Highest education level: Not on file  Social Needs  . Financial resource strain: Not on file  . Food insecurity - worry: Not on file  . Food insecurity - inability: Not on file  . Transportation needs - medical: Not on file  . Transportation needs -  non-medical: Not on file  Occupational History  . Not on file  Tobacco Use  . Smoking status: Never Smoker  . Smokeless tobacco: Never Used  Substance and Sexual Activity  . Alcohol use: Yes    Comment: occasionally  . Drug use: No  . Sexual activity: No  Other Topics Concern  . Not on file  Social History Narrative  . Not on file    Outpatient Medications Prior to Visit  Medication Sig Dispense Refill  . aspirin EC 81 MG tablet Take 1 tablet (81 mg total) by mouth daily.    . nebivolol (BYSTOLIC) 10 MG tablet TAKE 1/2 TABLET(5 MG) BY MOUTH TWICE DAILY 90 tablet 1  . amLODipine (NORVASC) 2.5 MG tablet Take 1 tablet (2.5 mg total) by mouth daily. (Patient not taking: Reported on 04/08/2017) 30 tablet 1  . cloNIDine (CATAPRES) 0.1 MG tablet TAKE 1 TABLET(0.1 MG) BY MOUTH TWICE DAILY 180 tablet 1   No facility-administered medications prior to visit.     Allergies  Allergen Reactions  . Hctz [Hydrochlorothiazide] Rash  . Spironolactone   . Bactrim [Sulfamethoxazole-Trimethoprim] Rash  . Losartan Palpitations    Palpitations and fever    Review of Systems  Constitutional: Negative for fever and malaise/fatigue.  HENT: Negative for congestion.   Eyes: Negative for blurred vision.  Respiratory: Negative for shortness of breath.   Cardiovascular: Negative for chest  pain, palpitations and leg swelling.  Gastrointestinal: Negative for abdominal pain, blood in stool and nausea.  Genitourinary: Negative for dysuria and frequency.  Musculoskeletal: Negative for falls.  Skin: Negative for rash.  Neurological: Negative for dizziness, loss of consciousness and headaches.  Endo/Heme/Allergies: Negative for environmental allergies.  Psychiatric/Behavioral: Negative for depression. The patient is not nervous/anxious.        Objective:    Physical Exam  Constitutional: He is oriented to person, place, and time. He appears well-developed and well-nourished. No distress.  HENT:    Head: Normocephalic and atraumatic.  Nose: Nose normal.  Eyes: Right eye exhibits no discharge. Left eye exhibits no discharge.  Neck: Normal range of motion. Neck supple.  Cardiovascular: Normal rate and regular rhythm.  No murmur heard. Pulmonary/Chest: Effort normal and breath sounds normal.  Abdominal: Soft. Bowel sounds are normal. There is no tenderness.  Musculoskeletal: He exhibits no edema.  Neurological: He is alert and oriented to person, place, and time.  Skin: Skin is warm and dry.  Psychiatric: He has a normal mood and affect.  Nursing note and vitals reviewed.   BP (!) 160/80   Pulse 61   Temp 98.1 F (36.7 C) (Oral)   Resp 18   Wt 139 lb (63 kg)   SpO2 99%   BMI 25.42 kg/m  Wt Readings from Last 3 Encounters:  10/18/17 139 lb (63 kg)  04/08/17 136 lb 6.4 oz (61.9 kg)  01/04/17 136 lb 12.8 oz (62.1 kg)   BP Readings from Last 3 Encounters:  10/18/17 (!) 160/80  04/08/17 124/78  01/04/17 (!) 176/78     Immunization History  Administered Date(s) Administered  . Tdap 09/10/2011    Health Maintenance  Topic Date Due  . PNA vac Low Risk Adult (1 of 2 - PCV13) 10/28/2017 (Originally 03/13/1995)  . TETANUS/TDAP  09/09/2021  . INFLUENZA VACCINE  Discontinued    Lab Results  Component Value Date   WBC 6.1 10/18/2017   HGB 14.1 10/18/2017   HCT 43.5 10/18/2017   PLT 150.0 10/18/2017   GLUCOSE 107 (H) 10/18/2017   CHOL 167 10/18/2017   TRIG 114.0 10/18/2017   HDL 41.90 10/18/2017   LDLDIRECT 124.0 09/06/2016   LDLCALC 102 (H) 10/18/2017   ALT 13 10/18/2017   AST 17 10/18/2017   NA 138 10/18/2017   K 4.8 10/18/2017   CL 103 10/18/2017   CREATININE 1.22 10/18/2017   BUN 15 10/18/2017   CO2 31 10/18/2017   TSH 2.07 10/18/2017   PSA 5.70 (H) 10/12/2011   HGBA1C 6.1 10/18/2017    Lab Results  Component Value Date   TSH 2.07 10/18/2017   Lab Results  Component Value Date   WBC 6.1 10/18/2017   HGB 14.1 10/18/2017   HCT 43.5 10/18/2017    MCV 84.8 10/18/2017   PLT 150.0 10/18/2017   Lab Results  Component Value Date   NA 138 10/18/2017   K 4.8 10/18/2017   CO2 31 10/18/2017   GLUCOSE 107 (H) 10/18/2017   BUN 15 10/18/2017   CREATININE 1.22 10/18/2017   BILITOT 0.6 10/18/2017   ALKPHOS 49 10/18/2017   AST 17 10/18/2017   ALT 13 10/18/2017   PROT 7.5 10/18/2017   ALBUMIN 4.2 10/18/2017   CALCIUM 9.6 10/18/2017   GFR 59.64 (L) 10/18/2017   Lab Results  Component Value Date   CHOL 167 10/18/2017   Lab Results  Component Value Date   HDL 41.90 10/18/2017   Lab Results  Component Value Date   LDLCALC 102 (H) 10/18/2017   Lab Results  Component Value Date   TRIG 114.0 10/18/2017   Lab Results  Component Value Date   CHOLHDL 4 10/18/2017   Lab Results  Component Value Date   HGBA1C 6.1 10/18/2017         Assessment & Plan:   Problem List Items Addressed This Visit    Benign essential HTN    Elevated on arrival because he got lost on the way here. Did improve some on recheck but remained elevated. He can increase his Bystolic to tid if his numbers remain elevated and let us know.      Relevant Medications   nebivolol (BYSTOLIC) 10 MG tablet   Insomnia    Sleeping better especially with increased exercise      Elevated PSA    No concerns symptom wise      Thrombocytopenia (HCC)    Check cbc today      Hyperglycemia    hgba1c acceptable, minimize simple carbs. Increase exercise as tolerated.       Relevant Orders   Hemoglobin A1c (Completed)   Hyperlipidemia, mild    Encouraged heart healthy diet, increase exercise, avoid trans fats, consider a krill oil cap daily      Relevant Medications   nebivolol (BYSTOLIC) 10 MG tablet   Other Relevant Orders   Lipid panel (Completed)    Other Visit Diagnoses    Essential hypertension       Relevant Medications   nebivolol (BYSTOLIC) 10 MG tablet   Other Relevant Orders   CBC (Completed)   Comprehensive metabolic panel (Completed)    TSH (Completed)      I have discontinued Steven Morrison's amLODipine, nebivolol, cloNIDine, nebivolol, and nebivolol. I am also having him start on triamcinolone cream and nebivolol. Additionally, I am having him maintain his aspirin EC.  Meds ordered this encounter  Medications  . triamcinolone cream (KENALOG) 0.1 %    Sig: Apply 1 application topically 2 (two) times daily.    Dispense:  80 g    Refill:  1  . DISCONTD: nebivolol (BYSTOLIC) 10 MG tablet    Sig: TAKE 1/2 TABLET(5 MG) BY MOUTH TWICE DAILY    Dispense:  90 tablet    Refill:  1  . DISCONTD: nebivolol (BYSTOLIC) 10 MG tablet    Sig: Take 0.5 tablets (5 mg total) by mouth 3 (three) times daily. TAKE 1/2 TABLET(5 MG) BY MOUTH TWICE DAILY    Dispense:  135 tablet    Refill:  1  . nebivolol (BYSTOLIC) 10 MG tablet    Sig: Take 1 tablet (10 mg total) by mouth 3 (three) times daily.    Dispense:  270 tablet    Refill:  0    CMA served as scribe during this visit. History, Physical and Plan performed by medical provider. Documentation and orders reviewed and attested to.  [name], MD

## 2017-10-18 NOTE — Patient Instructions (Signed)

## 2017-10-18 NOTE — Telephone Encounter (Signed)
Resent medication over to pharmacy with correct instructions

## 2017-10-18 NOTE — Assessment & Plan Note (Signed)
No concerns symptom wise

## 2017-10-18 NOTE — Assessment & Plan Note (Signed)
hgba1c acceptable, minimize simple carbs. Increase exercise as tolerated.  

## 2017-10-18 NOTE — Assessment & Plan Note (Signed)
Encouraged heart healthy diet, increase exercise, avoid trans fats, consider a krill oil cap daily 

## 2017-10-18 NOTE — Assessment & Plan Note (Signed)
Sleeping better especially with increased exercise

## 2017-10-18 NOTE — Assessment & Plan Note (Signed)
Check cbc today 

## 2017-10-27 NOTE — Telephone Encounter (Signed)
Last office visit 10/18/17; see note dated 10/27/17; will route to office

## 2017-10-27 NOTE — Telephone Encounter (Signed)
Pt states he wants a new Rx for the 5mg  tablets of BYSTOLIC so that he doesn't have to break his 10mg  tablets in half, contact pt to advise

## 2017-10-31 NOTE — Telephone Encounter (Signed)
Pharmacy calling and stating patients wants 5mg  Bystolic called in, please advise. [facility], [address] Call back [phone]

## 2017-11-01 MED ORDER — NEBIVOLOL HCL 5 MG PO TABS: 5.0000 mg | ORAL_TABLET | Freq: Three times a day (TID) | ORAL | 1 refills | Status: DC

## 2017-11-01 NOTE — Telephone Encounter (Signed)
Medications sent

## 2017-11-03 ENCOUNTER — Telehealth: Payer: Self-pay

## 2017-11-03 NOTE — Telephone Encounter (Signed)
Request Reference Number: [id number]. BYSTOLIC TAB 5MG  is approved through 08/15/2018. For further questions, call [phone]

## 2017-11-03 NOTE — Telephone Encounter (Signed)
PA initiated via Covermymeds; KEY: [id number]. Awaiting determination.

## 2017-11-04 NOTE — Telephone Encounter (Signed)
Called the pt and pt states he contacted the pharmacy and the pharmacy states they are still awaiting the approval of the medication.

## 2017-11-04 NOTE — Telephone Encounter (Signed)
LM on prescriber line informing [facility] that PA was approved yesterday- informed them to contact insurance directly if still having issues receiving approval.

## 2017-11-04 NOTE — Telephone Encounter (Signed)
Pt called to check the status of the medication, if everything has cleared please send medication or if the pt needs to be contacted and informed about anything contact the pt

## 2017-11-18 ENCOUNTER — Other Ambulatory Visit: Payer: Self-pay | Admitting: Family Medicine

## 2017-11-18 ENCOUNTER — Ambulatory Visit (INDEPENDENT_AMBULATORY_CARE_PROVIDER_SITE_OTHER): Payer: Medicare Other | Admitting: Family Medicine

## 2017-11-18 DIAGNOSIS — I10 Essential (primary) hypertension: Secondary | ICD-10-CM | POA: Diagnosis not present

## 2017-11-18 MED ORDER — AMLODIPINE BESYLATE 5 MG PO TABS: 5.0000 mg | ORAL_TABLET | Freq: Every day | ORAL | 1 refills | Status: DC

## 2017-11-18 NOTE — Progress Notes (Signed)
Pre visit review using our clinic review tool, if applicable. No additional management support is needed unless otherwise documented below in the visit note.  BP Readings from Last 3 Encounters:  10/18/17 (!) 160/80  04/08/17 124/78  01/04/17 (!) 176/78     Per 10/18/17 office note: Return in 1 month for bp recheck.   Pt reports compliance with medication and has already taken two of the three doses today.   BP today: 2:25 pm: 225/77, Pulse: 69 2:33 pm: 226/86, Pulse: 53  Per Dr. Nani Ravens add amlodipine 5mg  one tablet daily and continue taking nebivolol (BYSTOLIC) 5 mg tablets one tablet 3 times daily.   Rx sent to pt's pharmacy for amlodipine 5 mg.   BP recheck in 1 month. Appt scheduled for 12/20/17 at 3 pm.   Pt notified of provider instructions and verbalized understanding.

## 2017-11-18 NOTE — Progress Notes (Signed)
Noted. Agree with above.  Not ideal with vasodilator in Bystolic, but given his allergies, I think this is reasonable until he can follow up with Dr. Charlett Blake.   [place], DO 11/18/17 5:00 PM

## 2017-11-18 NOTE — Patient Instructions (Signed)
Per Dr. Nani Ravens add amlodipine 5mg  one tablet daily and continue taking nebivolol (BYSTOLIC) 5 mg tablets one tablet 3 times daily.   BP recheck in 1 month. Appt scheduled for 12/20/17 at 3 pm.

## 2017-12-20 ENCOUNTER — Ambulatory Visit (INDEPENDENT_AMBULATORY_CARE_PROVIDER_SITE_OTHER): Payer: Medicare Other | Admitting: Family Medicine

## 2017-12-20 VITALS — BP 162/76 | HR 56

## 2017-12-20 DIAGNOSIS — I10 Essential (primary) hypertension: Secondary | ICD-10-CM

## 2017-12-20 NOTE — Progress Notes (Signed)
Pt here for blood pressure check per Dr Nani Ravens during Dr Frederik Pear absence  BP last visit = 225/77, 226/86  Pt was started on Amlodipine 5mg  once daily last visit and advised to continue Bystolic 5mg  three times a day.  Pt reports various aches and pains since starting Amlodipine and also notes that he has started exercising again as well. He wonders if symptoms could be from medication or increased activity. Advised pt per PCP, likely related to increased physical activity but to let us know if symptoms worsen.  BP today @ 3:15 = 196/82 HR = 56  Repeat BP @ 3:30 = 162/76  Advised pt per PCP; Continue current medications and follow up with PCP in 2 months.  Appointment scheduled for 02/21/18 at 1:45pm.  Nursing blood pressure check note reviewed. Agree with documention and plan.

## 2018-02-21 ENCOUNTER — Encounter: Payer: Self-pay | Admitting: Family Medicine

## 2018-02-21 ENCOUNTER — Ambulatory Visit (INDEPENDENT_AMBULATORY_CARE_PROVIDER_SITE_OTHER): Payer: Medicare Other | Admitting: Family Medicine

## 2018-02-21 VITALS — BP 164/74 | HR 60 | Temp 98.1°F | Resp 16 | Wt 136.0 lb

## 2018-02-21 DIAGNOSIS — D696 Thrombocytopenia, unspecified: Secondary | ICD-10-CM

## 2018-02-21 DIAGNOSIS — K5900 Constipation, unspecified: Secondary | ICD-10-CM

## 2018-02-21 DIAGNOSIS — E785 Hyperlipidemia, unspecified: Secondary | ICD-10-CM

## 2018-02-21 DIAGNOSIS — I10 Essential (primary) hypertension: Secondary | ICD-10-CM

## 2018-02-21 DIAGNOSIS — R972 Elevated prostate specific antigen [PSA]: Secondary | ICD-10-CM

## 2018-02-21 DIAGNOSIS — R739 Hyperglycemia, unspecified: Secondary | ICD-10-CM

## 2018-02-21 MED ORDER — NEBIVOLOL HCL 10 MG PO TABS: 10.0000 mg | ORAL_TABLET | Freq: Two times a day (BID) | ORAL | 3 refills | Status: DC | PRN

## 2018-02-21 MED ORDER — NEBIVOLOL HCL 5 MG PO TABS: 5.0000 mg | ORAL_TABLET | Freq: Three times a day (TID) | ORAL | 1 refills | Status: DC

## 2018-02-21 NOTE — Assessment & Plan Note (Signed)
Repeat PSA 

## 2018-02-21 NOTE — Progress Notes (Signed)
Subjective:  I acted as a Education administrator for [employer]. Yancey Flemings, [place]   Patient ID: Steven Morrison, male    DOB: 02-Jun-1928, 84 y.o.   MRN: [id number]  Chief Complaint  Patient presents with  . Hypertension    HPI  Patient is in today for follow up on hypertension and he reports feeling well. No recent febrile illness or hospitalizations. He has been moving recently but feels he has tolerated this well. No acute concerns. He denies polyuria or polydipsia. He stays active and eats a heart healthy diet. Denies CP/palp/SOB/HA/congestion/fevers/GI or GU c/o. Taking meds as prescribed  Patient Care Team: [name], MD as PCP - General (Family Medicine)   Past Medical History:  Diagnosis Date  . Anxiety state, unspecified 12/19/2013  . Benign essential HTN 09/12/2011   Does not tolerate thiazide diuretics developed very bad dermatitis   . Constipation 02/10/2012  . Dermatitis 09/13/2012  . Elevated PSA 11/03/2011  . HTN (hypertension) 09/12/2011  . Hyperlipidemia, mild 04/17/2015  . Intertrigo 03/20/2012  . Medicare annual wellness visit, subsequent 09/12/2011  . Rash, skin 01/02/2012   Left lower leg  . Restless sleeper 01/04/2017  . Thrombocytopenia ([facility]) 11/17/2011   slight  . Uncontrolled hypertension 09/12/2011  . Urinary hesitancy 10/12/2011  . Urinary urgency 10/12/2011    History reviewed. No pertinent surgical history.  Family History  Problem Relation Age of Onset  . Migraines Daughter   . Allergies Daughter   . Allergies Son     Social History   Socioeconomic History  . Marital status: Divorced    Spouse name: Not on file  . Number of children: Not on file  . Years of education: Not on file  . Highest education level: Not on file  Occupational History  . Not on file  Social Needs  . Financial resource strain: Not on file  . Food insecurity:    Worry: Not on file    Inability: Not on file  . Transportation needs:    Medical: Not on file    Non-medical: Not on  file  Tobacco Use  . Smoking status: Never Smoker  . Smokeless tobacco: Never Used  Substance and Sexual Activity  . Alcohol use: Yes    Comment: occasionally  . Drug use: No  . Sexual activity: Never  Lifestyle  . Physical activity:    Days per week: Not on file    Minutes per session: Not on file  . Stress: Not on file  Relationships  . Social connections:    Talks on phone: Not on file    Gets together: Not on file    Attends religious service: Not on file    Active member of club or organization: Not on file    Attends meetings of clubs or organizations: Not on file    Relationship status: Not on file  . Intimate partner violence:    Fear of current or ex partner: Not on file    Emotionally abused: Not on file    Physically abused: Not on file    Forced sexual activity: Not on file  Other Topics Concern  . Not on file  Social History Narrative  . Not on file    Outpatient Medications Prior to Visit  Medication Sig Dispense Refill  . aspirin EC 81 MG tablet Take 1 tablet (81 mg total) by mouth daily.    . nebivolol (BYSTOLIC) 5 MG tablet Take 1 tablet (5 mg total) by mouth 3 (three)  times daily. 90 tablet 1  . triamcinolone cream (KENALOG) 0.1 % Apply 1 application topically 2 (two) times daily. (Patient not taking: Reported on 02/21/2018) 80 g 1  . amLODipine (NORVASC) 5 MG tablet TAKE 1 TABLET(5 MG) BY MOUTH DAILY (Patient not taking: Reported on 02/21/2018) 90 tablet 1   No facility-administered medications prior to visit.     Allergies  Allergen Reactions  . Hctz [Hydrochlorothiazide] Rash  . Spironolactone   . Bactrim [Sulfamethoxazole-Trimethoprim] Rash  . Losartan Palpitations    Palpitations and fever    Review of Systems  Constitutional: Negative for fever and malaise/fatigue.  HENT: Negative for congestion.   Eyes: Negative for blurred vision.  Respiratory: Negative for shortness of breath.   Cardiovascular: Negative for chest pain, palpitations and  leg swelling.  Gastrointestinal: Negative for abdominal pain, blood in stool and nausea.  Genitourinary: Negative for dysuria and frequency.  Musculoskeletal: Negative for falls.  Skin: Negative for rash.  Neurological: Negative for dizziness, loss of consciousness and headaches.  Endo/Heme/Allergies: Negative for environmental allergies.  Psychiatric/Behavioral: Negative for depression. The patient is not nervous/anxious.        Objective:    Physical Exam  Constitutional: He is oriented to person, place, and time. He appears well-developed and well-nourished. No distress.  HENT:  Head: Normocephalic and atraumatic.  Nose: Nose normal.  Eyes: Right eye exhibits no discharge. Left eye exhibits no discharge.  Neck: Normal range of motion. Neck supple.  Cardiovascular: Normal rate and regular rhythm.  No murmur heard. Pulmonary/Chest: Effort normal and breath sounds normal.  Abdominal: Soft. Bowel sounds are normal. There is no tenderness.  Musculoskeletal: He exhibits no edema.  Neurological: He is alert and oriented to person, place, and time.  Skin: Skin is warm and dry.  Psychiatric: He has a normal mood and affect.  Nursing note and vitals reviewed.   BP (!) 164/74   Pulse 60   Temp 98.1 F (36.7 C) (Oral)   Resp 16   Wt 136 lb (61.7 kg)   SpO2 100%   BMI 24.87 kg/m  Wt Readings from Last 3 Encounters:  02/21/18 136 lb (61.7 kg)  10/18/17 139 lb (63 kg)  04/08/17 136 lb 6.4 oz (61.9 kg)   BP Readings from Last 3 Encounters:  02/26/18 (!) 164/74  12/20/17 (!) 162/76  10/18/17 (!) 160/80     Immunization History  Administered Date(s) Administered  . Tdap 09/10/2011    Health Maintenance  Topic Date Due  . PNA vac Low Risk Adult (1 of 2 - PCV13) 03/13/1995  . TETANUS/TDAP  09/09/2021  . INFLUENZA VACCINE  Discontinued    Lab Results  Component Value Date   WBC 6.1 10/18/2017   HGB 14.1 10/18/2017   HCT 43.5 10/18/2017   PLT 150.0 10/18/2017    GLUCOSE 107 (H) 10/18/2017   CHOL 167 10/18/2017   TRIG 114.0 10/18/2017   HDL 41.90 10/18/2017   LDLDIRECT 124.0 09/06/2016   LDLCALC 102 (H) 10/18/2017   ALT 13 10/18/2017   AST 17 10/18/2017   NA 138 10/18/2017   K 4.8 10/18/2017   CL 103 10/18/2017   CREATININE 1.22 10/18/2017   BUN 15 10/18/2017   CO2 31 10/18/2017   TSH 2.07 10/18/2017   PSA 5.70 (H) 10/12/2011   HGBA1C 6.1 10/18/2017    Lab Results  Component Value Date   TSH 2.07 10/18/2017   Lab Results  Component Value Date   WBC 6.1 10/18/2017   HGB 14.1  10/18/2017   HCT 43.5 10/18/2017   MCV 84.8 10/18/2017   PLT 150.0 10/18/2017   Lab Results  Component Value Date   NA 138 10/18/2017   K 4.8 10/18/2017   CO2 31 10/18/2017   GLUCOSE 107 (H) 10/18/2017   BUN 15 10/18/2017   CREATININE 1.22 10/18/2017   BILITOT 0.6 10/18/2017   ALKPHOS 49 10/18/2017   AST 17 10/18/2017   ALT 13 10/18/2017   PROT 7.5 10/18/2017   ALBUMIN 4.2 10/18/2017   CALCIUM 9.6 10/18/2017   GFR 59.64 (L) 10/18/2017   Lab Results  Component Value Date   CHOL 167 10/18/2017   Lab Results  Component Value Date   HDL 41.90 10/18/2017   Lab Results  Component Value Date   LDLCALC 102 (H) 10/18/2017   Lab Results  Component Value Date   TRIG 114.0 10/18/2017   Lab Results  Component Value Date   CHOLHDL 4 10/18/2017   Lab Results  Component Value Date   HGBA1C 6.1 10/18/2017         Assessment & Plan:   Problem List Items Addressed This Visit    Benign essential HTN - Primary    Has had trouble with blood pressure off and on for years and does not tolerate numerous meds. Will increase the Bystolic which he does tolerate to 10 mg po bid and he is referred to cardiology for consideration of resistent hypertension.      Relevant Medications   nebivolol (BYSTOLIC) 10 MG tablet   Other Relevant Orders   Ambulatory referral to Cardiology   CBC   Comprehensive metabolic panel   TSH   Elevated PSA    Repeat  PSA      Relevant Orders   PSA   Thrombocytopenia ([facility])    Check cbc       Constipation    Encouraged increased hydration and fiber in diet. Daily probiotics. If bowels not moving can use MOM 2 tbls po in 4 oz of warm prune juice by mouth every 2-3 days. If no results then repeat in 4 hours with  Dulcolax suppository pr, may repeat again in 4 more hours as needed. Seek care if symptoms worsen. Consider daily Miralax and/or Dulcolax if symptoms persist.       Hyperglycemia    hgba1c acceptable, minimize simple carbs. Increase exercise as tolerated. Continue current meds      Relevant Orders   Hemoglobin A1c   Hyperlipidemia, mild    Encouraged heart healthy diet, increase exercise, avoid trans fats, consider a krill oil cap daily      Relevant Medications   nebivolol (BYSTOLIC) 10 MG tablet   Other Relevant Orders   Lipid panel      I have discontinued Mr. Shepherd C. Micciche's nebivolol, amLODipine, and nebivolol. I am also having him start on nebivolol. Additionally, I am having him maintain his aspirin EC and triamcinolone cream.  Meds ordered this encounter  Medications  . DISCONTD: nebivolol (BYSTOLIC) 5 MG tablet    Sig: Take 1 tablet (5 mg total) by mouth 3 (three) times daily.    Dispense:  90 tablet    Refill:  1  . nebivolol (BYSTOLIC) 10 MG tablet    Sig: Take 1 tablet (10 mg total) by mouth 2 (two) times daily as needed.    Dispense:  60 tablet    Refill:  3    CMA served as scribe during this visit. History, Physical and Plan performed by medical  provider. Documentation and orders reviewed and attested to.  [name], MD

## 2018-02-21 NOTE — Assessment & Plan Note (Signed)
Encouraged heart healthy diet, increase exercise, avoid trans fats, consider a krill oil cap daily 

## 2018-02-21 NOTE — Assessment & Plan Note (Addendum)
Has had trouble with blood pressure off and on for years and does not tolerate numerous meds. Will increase the Bystolic which he does tolerate to 10 mg po bid and he is referred to cardiology for consideration of resistent hypertension.

## 2018-02-21 NOTE — Assessment & Plan Note (Signed)
hgba1c acceptable, minimize simple carbs. Increase exercise as tolerated. Continue current meds 

## 2018-02-21 NOTE — Assessment & Plan Note (Signed)
Encouraged increased hydration and fiber in diet. Daily probiotics. If bowels not moving can use MOM 2 tbls po in 4 oz of warm prune juice by mouth every 2-3 days. If no results then repeat in 4 hours with  Dulcolax suppository pr, may repeat again in 4 more hours as needed. Seek care if symptoms worsen. Consider daily Miralax and/or Dulcolax if symptoms persist.  

## 2018-02-21 NOTE — Patient Instructions (Signed)

## 2018-02-21 NOTE — Assessment & Plan Note (Signed)
Check cbc 

## 2018-03-04 ENCOUNTER — Emergency Department (HOSPITAL_COMMUNITY)
Admission: EM | Admit: 2018-03-04 | Discharge: 2018-03-04 | Disposition: A | Discharge status: Home / Self Care | Payer: Medicare Other | Attending: Emergency Medicine | Admitting: Emergency Medicine

## 2018-03-04 ENCOUNTER — Emergency Department (HOSPITAL_COMMUNITY): Payer: Medicare Other

## 2018-03-04 ENCOUNTER — Other Ambulatory Visit: Payer: Self-pay

## 2018-03-04 ENCOUNTER — Encounter (HOSPITAL_COMMUNITY): Payer: Self-pay | Admitting: *Deleted

## 2018-03-04 DIAGNOSIS — M79603 Pain in arm, unspecified: Secondary | ICD-10-CM | POA: Diagnosis not present

## 2018-03-04 DIAGNOSIS — S199XXA Unspecified injury of neck, initial encounter: Secondary | ICD-10-CM | POA: Diagnosis present

## 2018-03-04 DIAGNOSIS — R079 Chest pain, unspecified: Secondary | ICD-10-CM | POA: Diagnosis not present

## 2018-03-04 DIAGNOSIS — M79602 Pain in left arm: Secondary | ICD-10-CM | POA: Diagnosis not present

## 2018-03-04 DIAGNOSIS — R112 Nausea with vomiting, unspecified: Secondary | ICD-10-CM | POA: Diagnosis not present

## 2018-03-04 DIAGNOSIS — X509XXA Other and unspecified overexertion or strenuous movements or postures, initial encounter: Secondary | ICD-10-CM | POA: Diagnosis not present

## 2018-03-04 DIAGNOSIS — S161XXA Strain of muscle, fascia and tendon at neck level, initial encounter: Secondary | ICD-10-CM | POA: Diagnosis not present

## 2018-03-04 DIAGNOSIS — Y939 Activity, unspecified: Secondary | ICD-10-CM | POA: Diagnosis not present

## 2018-03-04 DIAGNOSIS — R52 Pain, unspecified: Secondary | ICD-10-CM | POA: Diagnosis not present

## 2018-03-04 DIAGNOSIS — Y929 Unspecified place or not applicable: Secondary | ICD-10-CM | POA: Diagnosis not present

## 2018-03-04 DIAGNOSIS — Y999 Unspecified external cause status: Secondary | ICD-10-CM | POA: Insufficient documentation

## 2018-03-04 DIAGNOSIS — Z7982 Long term (current) use of aspirin: Secondary | ICD-10-CM | POA: Insufficient documentation

## 2018-03-04 DIAGNOSIS — I10 Essential (primary) hypertension: Secondary | ICD-10-CM | POA: Insufficient documentation

## 2018-03-04 DIAGNOSIS — M542 Cervicalgia: Secondary | ICD-10-CM | POA: Diagnosis not present

## 2018-03-04 LAB — BASIC METABOLIC PANEL
Anion gap: 9 (ref 5–15)
BUN: 10 mg/dL (ref 8–23)
CO2: 25 mmol/L (ref 22–32)
Calcium: 9.1 mg/dL (ref 8.9–10.3)
Chloride: 105 mmol/L (ref 98–111)
Creatinine, Ser: 1.24 mg/dL (ref 0.61–1.24)
GFR, EST AFRICAN AMERICAN: 58 mL/min — AB (ref >60)
GFR, EST NON AFRICAN AMERICAN: 50 mL/min — AB (ref >60)
Glucose, Bld: 105 mg/dL — ABNORMAL HIGH (ref 70–99)
POTASSIUM: 4.2 mmol/L (ref 3.5–5.1)
SODIUM: 139 mmol/L (ref 135–145)

## 2018-03-04 LAB — CBC WITH DIFFERENTIAL/PLATELET
Abs Immature Granulocytes: 0 10*3/uL (ref 0.0–0.1)
Basophils Absolute: 0.1 10*3/uL (ref 0.0–0.1)
Basophils Relative: 1 %
Eosinophils Absolute: 0.3 10*3/uL (ref 0.0–0.7)
Eosinophils Relative: 6 %
HEMATOCRIT: 43.5 % (ref 39.0–52.0)
HEMOGLOBIN: 14.1 g/dL (ref 13.0–17.0)
IMMATURE GRANULOCYTES: 0 %
LYMPHS ABS: 1.5 10*3/uL (ref 0.7–4.0)
LYMPHS PCT: 29 %
MCH: 27.6 pg (ref 26.0–34.0)
MCHC: 32.4 g/dL (ref 30.0–36.0)
MCV: 85.1 fL (ref 78.0–100.0)
Monocytes Absolute: 0.3 10*3/uL (ref 0.1–1.0)
Monocytes Relative: 6 %
Neutro Abs: 2.9 10*3/uL (ref 1.7–7.7)
Neutrophils Relative %: 58 %
Platelets: 145 10*3/uL — ABNORMAL LOW (ref 150–400)
RBC: 5.11 MIL/uL (ref 4.22–5.81)
RDW: 14.6 % (ref 11.5–15.5)
WBC: 5.1 10*3/uL (ref 4.0–10.5)

## 2018-03-04 LAB — TROPONIN I: Troponin I: <0.03 ng/mL (ref <0.03)

## 2018-03-04 NOTE — ED Provider Notes (Signed)
[facility]ENC[facility] DEPART[facility] Provider Note   CSN: [id number] Arrival date & time: 03/04/18  1520     History   Chief Complaint Chief Complaint  Patient presents with  . Neck Pain    HPI Steven Morrison is a 84 y.o. male.  HPI Patient presents with left neck left arm pain.  Goes down his left shoulder.  Comes and goes somewhat.  Has had for the last 4 days.  States that 5 days ago he had done some painting which is unusual for him.  Since then he has had the pain.  Does not come on with exertion.  No numbness or weakness.  Got seen at urgent care and then got sent in here.  Had cervical spine and left shoulder x-ray done, however he does not know the results.  History of hypertension.  No cardiac history.  No difficulty breathing.  No diaphoresis.  States the pain tends to start near his left axilla and works somewhat proximally. Past Medical History:  Diagnosis Date  . Anxiety state, unspecified 12/19/2013  . Benign essential HTN 09/12/2011   Does not tolerate thiazide diuretics developed very bad dermatitis   . Constipation 02/10/2012  . Dermatitis 09/13/2012  . Elevated PSA 11/03/2011  . HTN (hypertension) 09/12/2011  . Hyperlipidemia, mild 04/17/2015  . Intertrigo 03/20/2012  . Medicare annual wellness visit, subsequent 09/12/2011  . Rash, skin 01/02/2012   Left lower leg  . Restless sleeper 01/04/2017  . Thrombocytopenia ([place]) 11/17/2011   slight  . Uncontrolled hypertension 09/12/2011  . Urinary hesitancy 10/12/2011  . Urinary urgency 10/12/2011    Patient Active Problem List   Diagnosis Date Noted  . Restless sleeper 01/04/2017  . Tinnitus of right ear 07/18/2016  . Hyperlipidemia, mild 04/17/2015  . Hyperglycemia 12/19/2013  . Anxiety state 12/19/2013  . Onychomycosis of toenail 11/08/2012  . Dermatitis 09/13/2012  . Constipation 02/10/2012  . Thrombocytopenia ([place]) 11/17/2011  . Elevated PSA 10/12/2011  . Benign essential HTN 09/12/2011  .  Medicare annual wellness visit, subsequent 09/12/2011  . Insomnia 09/12/2011    History reviewed. No pertinent surgical history.      Home Medications    Prior to Admission medications   Medication Sig Start Date End Date Taking? Authorizing Provider  aspirin EC 81 MG tablet Take 1 tablet (81 mg total) by mouth daily. 05/11/16   [name], MD  nebivolol (BYSTOLIC) 10 MG tablet Take 1 tablet (10 mg total) by mouth 2 (two) times daily as needed. 02/21/18   [name], MD  triamcinolone cream (KENALOG) 0.1 % Apply 1 application topically 2 (two) times daily. Patient not taking: Reported on 02/21/2018 10/18/17   [name], MD    Family History Family History  Problem Relation Age of Onset  . Migraines Daughter   . Allergies Daughter   . Allergies Son     Social History Social History   Tobacco Use  . Smoking status: Never Smoker  . Smokeless tobacco: Never Used  Substance Use Topics  . Alcohol use: Yes    Comment: occasionally  . Drug use: No     Allergies   Hctz [hydrochlorothiazide]; Spironolactone; Bactrim [sulfamethoxazole-trimethoprim]; and Losartan   Review of Systems Review of Systems  Constitutional: Negative for fever.  HENT: Negative for congestion.   Respiratory: Negative for shortness of breath.   Cardiovascular: Negative for chest pain.  Gastrointestinal: Negative for abdominal pain.  Genitourinary: Negative for flank pain.  Musculoskeletal: Negative for  back pain.       Neck and left arm pain.  Skin: Negative for rash.  Neurological: Negative for weakness.  Hematological: Negative for adenopathy.  Psychiatric/Behavioral: Negative for confusion.     Physical Exam Updated Vital Signs BP (!) 191/72   Pulse (!) 57   Temp 98.3 F (36.8 C) (Oral)   Resp 20   Ht 5\' 2"  (1.575 m)   Wt 61.7 kg (136 lb)   SpO2 98%   BMI 24.87 kg/m   Physical Exam  Constitutional: He appears well-developed.  HENT:  Head: Atraumatic.  Eyes: Pupils  are equal, round, and reactive to light.  Neck: Neck supple.  Cardiovascular: Normal rate.  Pulmonary/Chest: Effort normal.  Abdominal: Soft. There is no tenderness.  Musculoskeletal: He exhibits no edema.  No cervical spine tenderness.  No tenderness over left upper extremity.  Good range of motion elbow and shoulder.  Neurovascular intact in left hand.  Strong radial pulse.  Neurological: He is alert.  Skin: Skin is warm. Capillary refill takes less than 2 seconds.     ED Treatments / Results  Labs (all labs ordered are listed, but only abnormal results are displayed) Labs Reviewed  CBC WITH DIFFERENTIAL/PLATELET - Abnormal; Notable for the following components:      Result Value   Platelets 145 (*)    All other components within normal limits  BASIC METABOLIC PANEL - Abnormal; Notable for the following components:   Glucose, Bld 105 (*)    GFR calc non Af Amer 50 (*)    GFR calc Af Amer 58 (*)    All other components within normal limits  TROPONIN I    EKG EKG Interpretation  Date/Time:  Saturday March 04 2018 15:28:41 EDT Ventricular Rate:  51 PR Interval:    QRS Duration: 87 QT Interval:  428 QTC Calculation: 395 R Axis:   12 Text Interpretation:  Sinus rhythm Prolonged PR interval RSR' in V1 or V2, probably normal variant Borderline T abnormalities, lateral leads Confirmed by Davonna Belling [phone]) on 03/04/2018 5:09:16 PM   Radiology Dg Chest 2 View  Result Date: 03/04/2018 CLINICAL DATA:  Chest pain and left arm weakness beginning today. EXAM: CHEST - 2 VIEW COMPARISON:  None. FINDINGS: The heart size and mediastinal contours are within normal limits. Both lungs are clear. The visualized skeletal structures are unremarkable. IMPRESSION: No active cardiopulmonary disease. Electronically Signed   By: Earle Gell M.D.   On: 03/04/2018 16:17    Procedures Procedures (including critical care time)  Medications Ordered in ED Medications - No data to  display   Initial Impression / Assessment and Plan / ED Course  I have reviewed the triage vital signs and the nursing notes.  Pertinent labs & imaging results that were available during my care of the patient were reviewed by me and considered in my medical decision making (see chart for details).     Patient with neck pain left arm pain.  Began after some physical activity a couple days ago.  Likely musculoskeletal however EKG and blood work reassuring.  Some hypertension needs to be followed.  Discharge home.  Final Clinical Impressions(s) / ED Diagnoses   Final diagnoses:  Strain of neck muscle, initial encounter  Arm pain, anterior, left    ED Discharge Orders    None       Davonna Belling, MD 03/05/18 [phone]

## 2018-03-04 NOTE — ED Triage Notes (Signed)
PT reports his Lt side of neck hurts and  His lt arm. Pt also reports he was painting  On Wednesday and  The current pain started after painting.. Per EMS BP 216/95, P 56, R 20, O2% 100 on RA.

## 2018-03-07 ENCOUNTER — Telehealth: Payer: Self-pay

## 2018-03-07 NOTE — Telephone Encounter (Signed)
ED follow Up appointment scheduled.

## 2018-03-07 NOTE — Telephone Encounter (Deleted)
Patient has appointment scheduled with Dr. Charlett Blake on 03/21/18. Will see for ED follow up.

## 2018-03-08 DIAGNOSIS — H40013 Open angle with borderline findings, low risk, bilateral: Secondary | ICD-10-CM | POA: Diagnosis not present

## 2018-03-13 ENCOUNTER — Encounter: Payer: Self-pay | Admitting: Medical

## 2018-03-13 ENCOUNTER — Ambulatory Visit (INDEPENDENT_AMBULATORY_CARE_PROVIDER_SITE_OTHER): Payer: Medicare Other | Admitting: Medical

## 2018-03-13 ENCOUNTER — Other Ambulatory Visit: Payer: Self-pay | Admitting: Medical

## 2018-03-13 VITALS — BP 180/80 | HR 74 | Temp 98.2°F | Resp 16 | Ht 62.0 in | Wt 136.2 lb

## 2018-03-13 DIAGNOSIS — H9193 Unspecified hearing loss, bilateral: Secondary | ICD-10-CM | POA: Diagnosis not present

## 2018-03-13 DIAGNOSIS — I10 Essential (primary) hypertension: Secondary | ICD-10-CM

## 2018-03-13 DIAGNOSIS — H6122 Impacted cerumen, left ear: Secondary | ICD-10-CM

## 2018-03-13 MED ORDER — AMLODIPINE BESYLATE 5 MG PO TABS: ORAL_TABLET | ORAL | 0 refills | Status: DC

## 2018-03-13 NOTE — Patient Instructions (Addendum)
Your blood pressure is very high today and you report a history of some whitecoat component.  However we do need to proceed with caution and if you do get any neurologic or cardiac symptoms with elevated blood pressure then have to advise return to the emergency department.  I want you to continue your current Bystolic at 5 mg 3 times daily and I am going to add amlodipine 5 mg tablets.  Please start to take  5 mg amlodipine daily.  I would like to see blood pressure of less than 694 systolic this Friday(less than 90 diasolic).  If not decreased to that level by Friday then increase amlodipine to 10 mg daily.  I want you to get electronic blood pressure cuff and check blood pressure once daily.  Please make sure that you are seated and relaxed for about 5 minutes before you check blood pressure.  For cerumen impaction on both sides, I want you to get Debrox drops over-the-counter to soften wax.  Use those drops daily and will try to irrigate wax out on follow-up.  Please schedule appointment for follow-up in 7 days.

## 2018-03-13 NOTE — Progress Notes (Signed)
Subjective:    Patient ID: Steven Morrison, male    DOB: 26-May-1928, 84 y.o.   MRN: [id number]  HPI   Pt in for year of htn. Pt had some arm discomfort on left side on 03/05/2018.Marland Kitchen He went to UC first but then they wanted him to go to ED. Pt is on bystolic 5 mg tid. In epic states 10 mg tid. EKG normal that day and no further work up done. No recurrent left arm pain.  No cardiac or neurologic signs or symptoms.   Pt daughter states he has know white coat htn. He gets very nervous here. But also in other offices as well.   He even does not like to check his bp at home. It causes him anxiety.   Pt has no arm presently and no gross motor/sensory function deficits.     Review of Systems  Constitutional: Negative for chills, fatigue and fever.  HENT:       Decreased hearing.  Respiratory: Negative for cough, chest tightness, shortness of breath and wheezing.   Cardiovascular: Negative for chest pain and palpitations.  Gastrointestinal: Negative for abdominal pain.  Genitourinary: Negative for decreased urine volume, difficulty urinating, flank pain, frequency, hematuria, penile swelling and scrotal swelling.  Musculoskeletal: Negative for back pain and neck pain.  Skin: Negative for rash.  Neurological: Negative for dizziness, tremors, speech difficulty, weakness and headaches.  Hematological: Negative for adenopathy. Does not bruise/bleed easily.  Psychiatric/Behavioral: Negative for behavioral problems and confusion.   Past Medical History:  Diagnosis Date  . Anxiety state, unspecified 12/19/2013  . Benign essential HTN 09/12/2011   Does not tolerate thiazide diuretics developed very bad dermatitis   . Constipation 02/10/2012  . Dermatitis 09/13/2012  . Elevated PSA 11/03/2011  . HTN (hypertension) 09/12/2011  . Hyperlipidemia, mild 04/17/2015  . Intertrigo 03/20/2012  . Medicare annual wellness visit, subsequent 09/12/2011  . Rash, skin 01/02/2012   Left lower leg  . Restless  sleeper 01/04/2017  . Thrombocytopenia ([place]) 11/17/2011   slight  . Uncontrolled hypertension 09/12/2011  . Urinary hesitancy 10/12/2011  . Urinary urgency 10/12/2011     Social History   Socioeconomic History  . Marital status: Divorced    Spouse name: Not on file  . Number of children: Not on file  . Years of education: Not on file  . Highest education level: Not on file  Occupational History  . Not on file  Social Needs  . Financial resource strain: Not on file  . Food insecurity:    Worry: Not on file    Inability: Not on file  . Transportation needs:    Medical: Not on file    Non-medical: Not on file  Tobacco Use  . Smoking status: Never Smoker  . Smokeless tobacco: Never Used  Substance and Sexual Activity  . Alcohol use: Yes    Comment: occasionally  . Drug use: No  . Sexual activity: Never  Lifestyle  . Physical activity:    Days per week: Not on file    Minutes per session: Not on file  . Stress: Not on file  Relationships  . Social connections:    Talks on phone: Not on file    Gets together: Not on file    Attends religious service: Not on file    Active member of club or organization: Not on file    Attends meetings of clubs or organizations: Not on file    Relationship status: Not on file  .  Intimate partner violence:    Fear of current or ex partner: Not on file    Emotionally abused: Not on file    Physically abused: Not on file    Forced sexual activity: Not on file  Other Topics Concern  . Not on file  Social History Narrative  . Not on file    No past surgical history on file.  Family History  Problem Relation Age of Onset  . Migraines Daughter   . Allergies Daughter   . Allergies Son     Allergies  Allergen Reactions  . Hctz [Hydrochlorothiazide] Rash  . Spironolactone   . Bactrim [Sulfamethoxazole-Trimethoprim] Rash  . Losartan Palpitations    Palpitations and fever    Current Outpatient Medications on File Prior to Visit    Medication Sig Dispense Refill  . aspirin EC 81 MG tablet Take 1 tablet (81 mg total) by mouth daily.    . nebivolol (BYSTOLIC) 10 MG tablet Take 1 tablet (10 mg total) by mouth 2 (two) times daily as needed. 60 tablet 3  . triamcinolone cream (KENALOG) 0.1 % Apply 1 application topically 2 (two) times daily. 80 g 1   No current facility-administered medications on file prior to visit.     BP (!) 180/80   Pulse (!) 52   Temp 98.2 F (36.8 C) (Oral)   Resp 16   Ht 5\' 2"  (1.575 m)   Wt 136 lb 3.2 oz (61.8 kg)   SpO2 100%   BMI 24.91 kg/m      Objective:   Physical Exam  General Mental Status- Alert. General Appearance- Not in acute distress.   Skin General: Color- Normal Color. Moisture- Normal Moisture.  Neck Carotid Arteries- Normal color. Moisture- Normal Moisture. No carotid bruits. No JVD.  Chest and Lung Exam Auscultation: Breath Sounds:-Normal.  Cardiovascular Auscultation:Rythm- Regular. Murmurs & Other Heart Sounds:Auscultation of the heart reveals- No Murmurs.  Abdomen Inspection:-Inspeection Normal. Palpation/Percussion:Note:No mass. Palpation and Percussion of the abdomen reveal- Non Tender, Non Distended + BS, no rebound or guarding.   Neurologic Cranial Nerve exam:- CN III-XII intact(No nystagmus), symmetric smile. Drift Test:- No drift. Romberg Exam:- Negative.  Heal to Toe Gait exam:-Normal. Finger to Nose:- Normal/Intact  Strength:- 5/5 equal and symmetric strength both upper and lower extremities.  Ears- bilateral cerumen impaction.     Assessment & Plan:  Your blood pressure is very high today and you report a history of some whitecoat component.  However we do need to proceed with caution and if you do get any neurologic or cardiac symptoms with elevated blood pressure then have to advise return to the emergency department.  I want you to continue your current Bystolic at 5 mg 3 times daily and I am going to add amlodipine 5 mg tablets.   Please start to take  5 mg amlodipine daily.  I would like to see blood pressure of less than 782 systolic this Friday(less than 90 diasolic).  If not decreased to that level by Friday then increase amlodipine to 10 mg daily.  I want you to get electronic blood pressure cuff and check blood pressure once daily.  Please make sure that you are seated and relaxed for about 5 minutes before you check blood pressure.  For cerumen impaction on both sides, I want you to get Debrox drops over-the-counter to soften wax.  Use those drops daily and will try to irrigate wax out on follow-up.  Please schedule appointment for follow-up in 7 days.  Mackie Pai, PA-C

## 2018-03-17 ENCOUNTER — Other Ambulatory Visit: Payer: Self-pay

## 2018-03-21 ENCOUNTER — Ambulatory Visit (INDEPENDENT_AMBULATORY_CARE_PROVIDER_SITE_OTHER): Payer: Medicare Other | Admitting: Family Medicine

## 2018-03-21 VITALS — BP 172/74 | HR 56

## 2018-03-21 DIAGNOSIS — I10 Essential (primary) hypertension: Secondary | ICD-10-CM

## 2018-03-21 NOTE — Progress Notes (Addendum)
Pre visit review using our clinic tool,if applicable. No additional management support is needed unless otherwise documented below in the visit note.   Pt here for Blood pressure check per order from Dr. Charlett Blake dated 03/13/18  Pt currently takes: Amlodipine 5 mg and Bystolic 10 mg daily. Patient has had medications today. Patient denies any pains and aches today or any other problems.   Brought in copy of his BP readings back to 03/13/18. Some were as follows: 212/94, 221/96,. 181/75,179/82,146/74,150/64,133/80.  Pt reports compliance with medication.  BP today @ =172//74 P = 56  Pt advised per DR. Charlett Blake that at this point a referral to Cardiology is necessary in order to get BP under control. Patient agreed. Advised that he will receive a call regarding appointment soon.  Medical screening examination/treatment was performed by qualified clinical staff member and as supervising physician I was immediately available for consultation/collaboration. I have reviewed documentation and agree with assessment and plan.  [name], MD   NUrsing  blood check note reviewed. Agree with documention and plan.

## 2018-04-04 ENCOUNTER — Ambulatory Visit (INDEPENDENT_AMBULATORY_CARE_PROVIDER_SITE_OTHER): Payer: Medicare Other | Admitting: Cardiovascular Disease

## 2018-04-04 ENCOUNTER — Encounter: Payer: Self-pay | Admitting: Cardiovascular Disease

## 2018-04-04 VITALS — BP 158/66 | HR 53 | Ht 62.0 in | Wt 136.6 lb

## 2018-04-04 DIAGNOSIS — Z79899 Other long term (current) drug therapy: Secondary | ICD-10-CM

## 2018-04-04 DIAGNOSIS — I10 Essential (primary) hypertension: Secondary | ICD-10-CM | POA: Diagnosis not present

## 2018-04-04 MED ORDER — LOSARTAN POTASSIUM 50 MG PO TABS: 50.0000 mg | ORAL_TABLET | Freq: Every day | ORAL | 3 refills | Status: DC

## 2018-04-04 MED ORDER — NEBIVOLOL HCL 10 MG PO TABS: 10.0000 mg | ORAL_TABLET | Freq: Every day | ORAL | 3 refills | Status: DC

## 2018-04-04 MED ORDER — AMLODIPINE BESYLATE 5 MG PO TABS: ORAL_TABLET | ORAL | 0 refills | Status: DC

## 2018-04-04 NOTE — Progress Notes (Signed)
Cardiology Office Note   Date:  04/04/2018   ID:  Steven, Morrison 02-06-1928, MRN [id number]  PCP:  [name], MD  Cardiologist:   Kathlyn Sacramento, MD   No chief complaint on file.     History of Present Illness: Steven Morrison is a 84 y.o. male who presents for a follow-up visit regarding management of hypertension.  He has not been seen by me since September 2016.  He has known history of refractory hypertension with poor adherence to medications.   A nuclear stress test in 12/2013 showed no evidence of ischemia with normal ejection fraction. Renal artery duplex in 2015 showed no evidence of renal artery stenosis. Multiple allergies and intolerance are listed in his list.  However, this does not seem to be accurate.  The patient took losartan for a while without any issues.  Hydrochlorothiazide caused a rash.  It is not entirely clear what kind of reaction he had with spironolactone but most likely it was something not related. His blood pressure has been difficult to control lately and currently he is taking Bystolic 10 mg twice daily and amlodipine 5 mg once daily. He had an emergency room visit in July for neck pain that turned out to be musculoskeletal.  He denies any chest pain, shortness of breath or palpitations.  Past Medical History:  Diagnosis Date  . Anxiety state, unspecified 12/19/2013  . Benign essential HTN 09/12/2011   Does not tolerate thiazide diuretics developed very bad dermatitis   . Constipation 02/10/2012  . Dermatitis 09/13/2012  . Elevated PSA 11/03/2011  . HTN (hypertension) 09/12/2011  . Hyperlipidemia, mild 04/17/2015  . Intertrigo 03/20/2012  . Medicare annual wellness visit, subsequent 09/12/2011  . Rash, skin 01/02/2012   Left lower leg  . Restless sleeper 01/04/2017  . Thrombocytopenia ([place]) 11/17/2011   slight  . Uncontrolled hypertension 09/12/2011  . Urinary hesitancy 10/12/2011  . Urinary urgency 10/12/2011    No past surgical  history on file.   Current Outpatient Medications  Medication Sig Dispense Refill  . amLODipine (NORVASC) 5 MG tablet 1-2 tab po q day 60 tablet 0  . aspirin EC 81 MG tablet Take 1 tablet (81 mg total) by mouth daily.    . nebivolol (BYSTOLIC) 10 MG tablet Take 1 tablet (10 mg total) by mouth 2 (two) times daily as needed. 60 tablet 3  . triamcinolone cream (KENALOG) 0.1 % Apply 1 application topically 2 (two) times daily. 80 g 1   No current facility-administered medications for this visit.     Allergies:   Hctz [hydrochlorothiazide]; Spironolactone; Bactrim [sulfamethoxazole-trimethoprim]; and Losartan    Social History:  The patient  reports that he has never smoked. He has never used smokeless tobacco. He reports that he drinks alcohol. He reports that he does not use drugs.   Family History:  The patient's family history includes Allergies in his daughter and son; Migraines in his daughter.    ROS:  Please see the history of present illness.   Otherwise, review of systems are positive for none.   All other systems are reviewed and negative.    PHYSICAL EXAM: VS:  BP (!) 158/66   Pulse (!) 53   Ht 5\' 2"  (1.575 m)   Wt 136 lb 9.6 oz (62 kg)   BMI 24.98 kg/m  , BMI Body mass index is 24.98 kg/m. GEN: Well nourished, well developed, in no acute distress  HEENT: normal  Neck: no JVD, carotid  bruits, or masses Cardiac: RRR; no murmurs, rubs, or gallops,no edema  Respiratory:  clear to auscultation bilaterally, normal work of breathing GI: soft, nontender, nondistended, + BS MS: no deformity or atrophy  Skin: warm and dry, no rash Neuro:  Strength and sensation are intact Psych: euthymic mood, full affect   EKG:  EKG is not ordered today.   Recent Labs: 10/18/2017: ALT 13; TSH 2.07 03/04/2018: BUN 10; Creatinine, Ser 1.24; Hemoglobin 14.1; Platelets 145; Potassium 4.2; Sodium 139    Lipid Panel    Component Value Date/Time   CHOL 167 10/18/2017 1133   TRIG 114.0  10/18/2017 1133   HDL 41.90 10/18/2017 1133   CHOLHDL 4 10/18/2017 1133   VLDL 22.8 10/18/2017 1133   LDLCALC 102 (H) 10/18/2017 1133   LDLDIRECT 124.0 09/06/2016 1403      Wt Readings from Last 3 Encounters:  04/04/18 136 lb 9.6 oz (62 kg)  03/13/18 136 lb 3.2 oz (61.8 kg)  03/04/18 136 lb (61.7 kg)      No flowsheet data found.    ASSESSMENT AND PLAN:  1.  Essential hypertension: Blood pressure is not controlled mostly due to compliance issues.  High-dose Bystolic in the past was associated with significant bradycardia and thus I elected to decrease the dose to 10 mg once daily.  Continue amlodipine 5 mg once daily.  He does have trace leg edema. I elected to add losartan 50 mg once daily which can be uptitrated 200 mg daily if needed.  Check basic metabolic profile in 1 week and follow-up with an APP in 2 months.    Disposition:   FU with me in 6 months  Signed,  Kathlyn Sacramento, MD  04/04/2018 8:47 AM    [facility]

## 2018-04-04 NOTE — Patient Instructions (Signed)
Medication Instructions:  Your physician has recommended you make the following change in your medication:  1) TAKE your Bystolic 10mg  tablet by mouth ONCE daily 2) TAKE your Norvasc 5mg  tablet by mouth ONCE daily 3) START Losartan 50 mg tablet by mouth ONCE daily  Labwork: Your physician recommends that you return for lab work in: 1 WEEK - BMET   Testing/Procedures: none  Follow-Up: We request that you follow-up in: 2 months with an extender and in 6 months with Dr Fletcher Anon.  You will receive a reminder letter in the mail two months in advance. If you don't receive a letter, please call our office to schedule the follow-up appointment.     Any Other Special Instructions Will Be Listed Below (If Applicable).     If you need a refill on your cardiac medications before your next appointment, please call your pharmacy.

## 2018-04-12 ENCOUNTER — Ambulatory Visit: Payer: Medicare Other | Admitting: Cardiology

## 2018-04-19 NOTE — Progress Notes (Addendum)
Subjective:   CHRISTOP HIPPERT is a 84 y.o. male who presents for Medicare Annual/Subsequent preventive examination.  Review of Systems: No ROS.  Medicare Wellness Visit. Additional risk factors are reflected in the social history. Cardiac Risk Factors include: advanced age (>49men, >97 women);dyslipidemia;hypertension;male gender Sleep patterns:Sleeps well.   Home Safety/Smoke Alarms: Feels safe in home. Smoke alarms in place. Lives in apt. Alone.   Male:     PSA-  Lab Results  Component Value Date   PSA 5.70 (H) 10/12/2011       Objective:    Vitals: BP (!) 168/70 (BP Location: Left Arm, Patient Position: Sitting, Cuff Size: Normal)   Pulse 89   Ht 5\' 2"  (1.575 m)   Wt 135 lb (61.2 kg)   SpO2 98%   BMI 24.69 kg/m   Body mass index is 24.69 kg/m.  Advanced Directives 04/20/2018 03/04/2018 10/28/2016  Does Patient Have a Medical Advance Directive? No No No  Would patient like information on creating a medical advance directive? Yes (MAU/Ambulatory/Procedural Areas - Information given) No - Patient declined Yes (MAU/Ambulatory/Procedural Areas - Information given)    Tobacco Social History   Tobacco Use  Smoking Status Never Smoker  Smokeless Tobacco Never Used     Counseling given: Not Answered   Clinical Intake: Pain : No/denies pain  Past Medical History:  Diagnosis Date  . Anxiety state, unspecified 12/19/2013  . Benign essential HTN 09/12/2011   Does not tolerate thiazide diuretics developed very bad dermatitis   . Constipation 02/10/2012  . Dermatitis 09/13/2012  . Elevated PSA 11/03/2011  . HTN (hypertension) 09/12/2011  . Hyperlipidemia, mild 04/17/2015  . Intertrigo 03/20/2012  . Medicare annual wellness visit, subsequent 09/12/2011  . Rash, skin 01/02/2012   Left lower leg  . Restless sleeper 01/04/2017  . Thrombocytopenia ([place]) 11/17/2011   slight  . Uncontrolled hypertension 09/12/2011  . Urinary hesitancy 10/12/2011  . Urinary urgency 10/12/2011    History reviewed. No pertinent surgical history. Family History  Problem Relation Age of Onset  . Migraines Daughter   . Allergies Daughter   . Allergies Son    Social History   Socioeconomic History  . Marital status: Divorced    Spouse name: Not on file  . Number of children: Not on file  . Years of education: Not on file  . Highest education level: Not on file  Occupational History  . Not on file  Social Needs  . Financial resource strain: Not on file  . Food insecurity:    Worry: Not on file    Inability: Not on file  . Transportation needs:    Medical: Not on file    Non-medical: Not on file  Tobacco Use  . Smoking status: Never Smoker  . Smokeless tobacco: Never Used  Substance and Sexual Activity  . Alcohol use: Yes    Comment: occasionally  . Drug use: No  . Sexual activity: Not Currently  Lifestyle  . Physical activity:    Days per week: Not on file    Minutes per session: Not on file  . Stress: Not on file  Relationships  . Social connections:    Talks on phone: Not on file    Gets together: Not on file    Attends religious service: Not on file    Active member of club or organization: Not on file    Attends meetings of clubs or organizations: Not on file    Relationship status: Not on file  Other Topics Concern  . Not on file  Social History Narrative  . Not on file    Outpatient Encounter Medications as of 04/20/2018  Medication Sig  . amLODipine (NORVASC) 5 MG tablet 1 tablet by mouth ONCE daily  . aspirin EC 81 MG tablet Take 1 tablet (81 mg total) by mouth daily.  Marland Kitchen losartan (COZAAR) 50 MG tablet Take 1 tablet (50 mg total) by mouth daily.  . nebivolol (BYSTOLIC) 10 MG tablet Take 1 tablet (10 mg total) by mouth daily.  Marland Kitchen triamcinolone cream (KENALOG) 0.1 % Apply 1 application topically 2 (two) times daily.   No facility-administered encounter medications on file as of 04/20/2018.     Activities of Daily Living In your present state of  health, do you have any difficulty performing the following activities: 04/20/2018  Hearing? N  Vision? N  Difficulty concentrating or making decisions? N  Walking or climbing stairs? N  Dressing or bathing? N  Doing errands, shopping? N  Preparing Food and eating ? N  Using the Toilet? N  In the past six months, have you accidently leaked urine? N  Do you have problems with loss of bowel control? N  Managing your Medications? N  Managing your Finances? N  Housekeeping or managing your Housekeeping? N  Some recent data might be hidden    Patient Care Team: [name], MD as PCP - General (Family Medicine)   Assessment:   This is a routine wellness examination for Steven Morrison. Physical assessment deferred to PCP.  Exercise Activities and Dietary recommendations Current Exercise Habits: Home exercise routine, Type of exercise: walking, Time (Minutes): 20, Frequency (Times/Week): 2, Weekly Exercise (Minutes/Week): 40, Intensity: Mild, Exercise limited by: None identified Diet (meal preparation, eat out, water intake, caffeinated beverages, dairy products, fruits and vegetables): well balanced Breakfast: granola mix Lunch: skips Dinner: varies. Does not eat meat.  Goals    . 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       Fall Risk Fall Risk  04/20/2018 10/28/2016 04/29/2016 04/17/2015  Falls in the past year? No No No No    Depression Screen PHQ 2/9 Scores 04/20/2018 10/28/2016 04/29/2016 04/17/2015  PHQ - 2 Score 0 0 0 0    Cognitive Function Ad8 score reviewed for issues:  Issues making decisions:no  Less interest in hobbies / activities:no  Repeats questions, stories (family complaining):no  Trouble using ordinary gadgets (microwave, computer, phone):no  Forgets the month or year: no  Mismanaging finances: no  Remembering appts:no  Daily problems with thinking and/or memory:no Ad8 score is=0     MMSE - Mini Mental State Exam  04/20/2018 10/28/2016  Not completed: Refused -  Orientation to time - 5  Orientation to Place - 5  Registration - 3  Attention/ Calculation - 3  Recall - 3  Language- name 2 objects - 2  Language- repeat - 1  Language- follow 3 step command - 3  Language- read & follow direction - 1  Write a sentence - 1  Copy design - 1  Total score - 28        Immunization History  Administered Date(s) Administered  . Tdap 09/10/2011   Screening Tests Health Maintenance  Topic Date Due  . PNA vac Low Risk Adult (1 of 2 - PCV13) 03/13/1995  . TETANUS/TDAP  09/09/2021  . INFLUENZA VACCINE  Discontinued      Plan:    Please schedule your next medicare  wellness visit with me in 1 yr.  Continue to eat heart healthy diet (full of fruits, vegetables, whole grains, lean protein, water--limit salt, fat, and sugar intake) and increase physical activity as tolerated.  Bring a copy of your living will and/or healthcare power of attorney to your next office visit.  Continue doing brain stimulating activities (puzzles, reading, adult coloring books, staying active) to keep memory sharp.    I have personally reviewed and noted the following in the patient's chart:   . Medical and social history . Use of alcohol, tobacco or illicit drugs  . Current medications and supplements . Functional ability and status . Nutritional status . Physical activity . Advanced directives . List of other physicians . Hospitalizations, surgeries, and ER visits in previous 12 months . Vitals . Screenings to include cognitive, depression, and falls . Referrals and appointments  In addition, I have reviewed and discussed with patient certain preventive protocols, quality metrics, and best practice recommendations. A written personalized care plan for preventive services as well as general preventive health recommendations were provided to patient.     Shela Nevin, [place]  04/20/2018 Medical screening  examination/treatment was performed by qualified clinical staff member and as supervising physician I was immediately available for consultation/collaboration. I have reviewed documentation and agree with assessment and plan.  [name], MD

## 2018-04-20 ENCOUNTER — Encounter: Payer: Self-pay | Admitting: *Deleted

## 2018-04-20 ENCOUNTER — Ambulatory Visit (INDEPENDENT_AMBULATORY_CARE_PROVIDER_SITE_OTHER): Payer: Medicare Other | Admitting: *Deleted

## 2018-04-20 ENCOUNTER — Ambulatory Visit (INDEPENDENT_AMBULATORY_CARE_PROVIDER_SITE_OTHER): Payer: Medicare Other | Admitting: Family Medicine

## 2018-04-20 ENCOUNTER — Encounter: Payer: Self-pay | Admitting: Family Medicine

## 2018-04-20 VITALS — BP 168/70 | HR 89 | Ht 62.0 in | Wt 135.0 lb

## 2018-04-20 DIAGNOSIS — Z0000 Encounter for general adult medical examination without abnormal findings: Secondary | ICD-10-CM | POA: Diagnosis not present

## 2018-04-20 DIAGNOSIS — I10 Essential (primary) hypertension: Secondary | ICD-10-CM | POA: Diagnosis not present

## 2018-04-20 DIAGNOSIS — R739 Hyperglycemia, unspecified: Secondary | ICD-10-CM | POA: Diagnosis not present

## 2018-04-20 DIAGNOSIS — E785 Hyperlipidemia, unspecified: Secondary | ICD-10-CM

## 2018-04-20 DIAGNOSIS — D696 Thrombocytopenia, unspecified: Secondary | ICD-10-CM

## 2018-04-20 NOTE — Patient Instructions (Signed)
Please schedule your next medicare wellness visit with me in 1 yr.  Continue to eat heart healthy diet (full of fruits, vegetables, whole grains, lean protein, water--limit salt, fat, and sugar intake) and increase physical activity as tolerated.  Bring a copy of your living will and/or healthcare power of attorney to your next office visit.  Continue doing brain stimulating activities (puzzles, reading, adult coloring books, staying active) to keep memory sharp.    Steven Morrison , Thank you for taking time to come for your Medicare Wellness Visit. I appreciate your ongoing commitment to your health goals. Please review the following plan we discussed and let me know if I can assist you in the future.   These are the goals we discussed: Goals    . 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       This is a list of the screening recommended for you and due dates:  Health Maintenance  Topic Date Due  . Pneumonia vaccines (1 of 2 - PCV13) 03/13/1995  . Tetanus Vaccine  09/09/2021  . Flu Shot  Discontinued    Health Maintenance, Male A healthy lifestyle and preventive care is important for your health and wellness. Ask your health care provider about what schedule of regular examinations is right for you. What should I know about weight and diet? Eat a Healthy Diet  Eat plenty of vegetables, fruits, whole grains, low-fat dairy products, and lean protein.  Do not eat a lot of foods high in solid fats, added sugars, or salt.  Maintain a Healthy Weight Regular exercise can help you achieve or maintain a healthy weight. You should:  Do at least 150 minutes of exercise each week. The exercise should increase your heart rate and make you sweat (moderate-intensity exercise).  Do strength-training exercises at least twice a week.  Watch Your Levels of Cholesterol and Blood Lipids  Have your blood tested for lipids and cholesterol every 5 years starting at  82 years of age. If you are at high risk for heart disease, you should start having your blood tested when you are 82 years old. You may need to have your cholesterol levels checked more often if: ? Your lipid or cholesterol levels are high. ? You are older than 82 years of age. ? You are at high risk for heart disease.  What should I know about cancer screening? Many types of cancers can be detected early and may often be prevented. Lung Cancer  You should be screened every year for lung cancer if: ? You are a current smoker who has smoked for at least 30 years. ? You are a former smoker who has quit within the past 15 years.  Talk to your health care provider about your screening options, when you should start screening, and how often you should be screened.  Colorectal Cancer  Routine colorectal cancer screening usually begins at 82 years of age and should be repeated every 5-10 years until you are 82 years old. You may need to be screened more often if early forms of precancerous polyps or small growths are found. Your health care provider may recommend screening at an earlier age if you have risk factors for colon cancer.  Your health care provider may recommend using home test kits to check for hidden blood in the stool.  A small camera at the end of a tube can be used to examine your colon (sigmoidoscopy or colonoscopy). This  checks for the earliest forms of colorectal cancer.  Prostate and Testicular Cancer  Depending on your age and overall health, your health care provider may do certain tests to screen for prostate and testicular cancer.  Talk to your health care provider about any symptoms or concerns you have about testicular or prostate cancer.  Skin Cancer  Check your skin from head to toe regularly.  Tell your health care provider about any new moles or changes in moles, especially if: ? There is a change in a mole's size, shape, or color. ? You have a mole that is  larger than a pencil eraser.  Always use sunscreen. Apply sunscreen liberally and repeat throughout the day.  Protect yourself by wearing long sleeves, pants, a wide-brimmed hat, and sunglasses when outside.  What should I know about heart disease, diabetes, and high blood pressure?  If you are 66-45 years of age, have your blood pressure checked every 3-5 years. If you are 41 years of age or older, have your blood pressure checked every year. You should have your blood pressure measured twice-once when you are at a hospital or clinic, and once when you are not at a hospital or clinic. Record the average of the two measurements. To check your blood pressure when you are not at a hospital or clinic, you can use: ? An automated blood pressure machine at a pharmacy. ? A home blood pressure monitor.  Talk to your health care provider about your target blood pressure.  If you are between 75-10 years old, ask your health care provider if you should take aspirin to prevent heart disease.  Have regular diabetes screenings by checking your fasting blood sugar level. ? If you are at a normal weight and have a low risk for diabetes, have this test once every three years after the age of 61. ? If you are overweight and have a high risk for diabetes, consider being tested at a younger age or more often.  A one-time screening for abdominal aortic aneurysm (AAA) by ultrasound is recommended for men aged 29-75 years who are current or former smokers. What should I know about preventing infection? Hepatitis B If you have a higher risk for hepatitis B, you should be screened for this virus. Talk with your health care provider to find out if you are at risk for hepatitis B infection. Hepatitis C Blood testing is recommended for:  Everyone born from [birth year] through 1963.  Anyone with known risk factors for hepatitis C.  Sexually Transmitted Diseases (STDs)  You should be screened each year for STDs  including gonorrhea and chlamydia if: ? You are sexually active and are younger than 82 years of age. ? You are older than 82 years of age and your health care provider tells you that you are at risk for this type of infection. ? Your sexual activity has changed since you were last screened and you are at an increased risk for chlamydia or gonorrhea. Ask your health care provider if you are at risk.  Talk with your health care provider about whether you are at high risk of being infected with HIV. Your health care provider may recommend a prescription medicine to help prevent HIV infection.  What else can I do?  Schedule regular health, dental, and eye exams.  Stay current with your vaccines (immunizations).  Do not use any tobacco products, such as cigarettes, chewing tobacco, and e-cigarettes. If you need help quitting, ask your health care provider.  Limit alcohol intake to no more than 2 drinks per day. One drink equals 12 ounces of beer, 5 ounces of wine, or 1 ounces of hard liquor.  Do not use street drugs.  Do not share needles.  Ask your health care provider for help if you need support or information about quitting drugs.  Tell your health care provider if you often feel depressed.  Tell your health care provider if you have ever been abused or do not feel safe at home. This information is not intended to replace advice given to you by your health care provider. Make sure you discuss any questions you have with your health care provider. Document Released: 01/29/2008 Document Revised: 03/31/2016 Document Reviewed: 05/06/2015 Elsevier Interactive Patient Education  [employer].

## 2018-04-20 NOTE — Assessment & Plan Note (Signed)
hgba1c acceptable, minimize simple carbs. Increase exercise as tolerated.  

## 2018-04-20 NOTE — Assessment & Plan Note (Signed)
Mild asymptomatic no chajnes

## 2018-04-20 NOTE — Assessment & Plan Note (Signed)
Encouraged heart healthy diet, increase exercise, avoid trans fats, consider a krill oil cap daily 

## 2018-04-20 NOTE — Assessment & Plan Note (Signed)
Improved on recheck, no changes today

## 2018-04-20 NOTE — Patient Instructions (Signed)

## 2018-04-20 NOTE — Progress Notes (Signed)
Subjective:  I acted as a Education administrator for Dr. Charlett Blake. [place], [place]  Patient ID: Steven Morrison, male    DOB: [birth year], 84 y.o.   MRN: [id number]  No chief complaint on file.   HPI  Patient is in today for a 6 month follow up. He is following up on his HTN and other medical concerns. He has no acute concerns. He denies any recent febrile illness or hospitalizations. His blood pressures have been running some better. Denies CP/palp/SOB/HA/congestion/fevers/GI or GU c/o. Taking meds as prescribed  Patient Care Team: [name], MD as PCP - General (Family Medicine)   Past Medical History:  Diagnosis Date  . Anxiety state, unspecified 12/19/2013  . Benign essential HTN 09/12/2011   Does not tolerate thiazide diuretics developed very bad dermatitis   . Constipation 02/10/2012  . Dermatitis 09/13/2012  . Elevated PSA 11/03/2011  . HTN (hypertension) 09/12/2011  . Hyperlipidemia, mild 04/17/2015  . Intertrigo 03/20/2012  . Medicare annual wellness visit, subsequent 09/12/2011  . Rash, skin 01/02/2012   Left lower leg  . Restless sleeper 01/04/2017  . Thrombocytopenia ([facility]) 11/17/2011   slight  . Uncontrolled hypertension 09/12/2011  . Urinary hesitancy 10/12/2011  . Urinary urgency 10/12/2011    No past surgical history on file.  Family History  Problem Relation Age of Onset  . Migraines Daughter   . Allergies Daughter   . Allergies Son     Social History   Socioeconomic History  . Marital status: Divorced    Spouse name: Not on file  . Number of children: Not on file  . Years of education: Not on file  . Highest education level: Not on file  Occupational History  . Not on file  Social Needs  . Financial resource strain: Not on file  . Food insecurity:    Worry: Not on file    Inability: Not on file  . Transportation needs:    Medical: Not on file    Non-medical: Not on file  Tobacco Use  . Smoking status: Never Smoker  . Smokeless tobacco: Never Used  Substance and  Sexual Activity  . Alcohol use: Yes    Comment: occasionally  . Drug use: No  . Sexual activity: Not Currently  Lifestyle  . Physical activity:    Days per week: Not on file    Minutes per session: Not on file  . Stress: Not on file  Relationships  . Social connections:    Talks on phone: Not on file    Gets together: Not on file    Attends religious service: Not on file    Active member of club or organization: Not on file    Attends meetings of clubs or organizations: Not on file    Relationship status: Not on file  . Intimate partner violence:    Fear of current or ex partner: Not on file    Emotionally abused: Not on file    Physically abused: Not on file    Forced sexual activity: Not on file  Other Topics Concern  . Not on file  Social History Narrative  . Not on file    Outpatient Medications Prior to Visit  Medication Sig Dispense Refill  . amLODipine (NORVASC) 5 MG tablet 1 tablet by mouth ONCE daily 60 tablet 0  . aspirin EC 81 MG tablet Take 1 tablet (81 mg total) by mouth daily.    Marland Kitchen losartan (COZAAR) 50 MG tablet Take 1 tablet (50 mg  total) by mouth daily. 90 tablet 3  . nebivolol (BYSTOLIC) 10 MG tablet Take 1 tablet (10 mg total) by mouth daily. 60 tablet 3  . triamcinolone cream (KENALOG) 0.1 % Apply 1 application topically 2 (two) times daily. 80 g 1   No facility-administered medications prior to visit.     Allergies  Allergen Reactions  . Hctz [Hydrochlorothiazide] Rash  . Bactrim [Sulfamethoxazole-Trimethoprim] Rash  . Losartan Palpitations    Palpitations and fever    Review of Systems  Constitutional: Negative for fever and malaise/fatigue.  HENT: Negative for congestion.   Eyes: Negative for blurred vision.  Respiratory: Negative for shortness of breath.   Cardiovascular: Negative for chest pain, palpitations and leg swelling.  Gastrointestinal: Negative for abdominal pain, blood in stool and nausea.  Genitourinary: Negative for dysuria  and frequency.  Musculoskeletal: Negative for falls.  Skin: Negative for rash.  Neurological: Negative for dizziness, loss of consciousness and headaches.  Endo/Heme/Allergies: Negative for environmental allergies.  Psychiatric/Behavioral: Negative for depression. The patient is not nervous/anxious.        Objective:    Physical Exam  Constitutional: He is oriented to person, place, and time. He appears well-developed and well-nourished. No distress.  HENT:  Head: Normocephalic and atraumatic.  Nose: Nose normal.  Eyes: Right eye exhibits no discharge. Left eye exhibits no discharge.  Neck: Normal range of motion. Neck supple.  Cardiovascular: Normal rate and regular rhythm.  No murmur heard. Pulmonary/Chest: Effort normal and breath sounds normal.  Abdominal: Soft. Bowel sounds are normal. There is no tenderness.  Musculoskeletal: He exhibits no edema.  Neurological: He is alert and oriented to person, place, and time.  Skin: Skin is warm and dry.  Psychiatric: He has a normal mood and affect.  Nursing note and vitals reviewed.   BP (!) 152/58   Pulse 89   Temp 98.3 F (36.8 C) (Oral)   Resp 18   Ht 5\' 2"  (1.575 m)   Wt 135 lb 3.2 oz (61.3 kg)   SpO2 98%   BMI 24.73 kg/m  Wt Readings from Last 3 Encounters:  04/20/18 135 lb (61.2 kg)  04/20/18 135 lb 3.2 oz (61.3 kg)  04/04/18 136 lb 9.6 oz (62 kg)   BP Readings from Last 3 Encounters:  04/20/18 (!) 168/70  04/20/18 (!) 152/58  04/04/18 (!) 158/66     Immunization History  Administered Date(s) Administered  . Tdap 09/10/2011    Health Maintenance  Topic Date Due  . PNA vac Low Risk Adult (1 of 2 - PCV13) 03/13/1995  . TETANUS/TDAP  09/09/2021  . INFLUENZA VACCINE  Discontinued    Lab Results  Component Value Date   WBC 5.1 03/04/2018   HGB 14.1 03/04/2018   HCT 43.5 03/04/2018   PLT 145 (L) 03/04/2018   GLUCOSE 105 (H) 03/04/2018   CHOL 167 10/18/2017   TRIG 114.0 10/18/2017   HDL 41.90  10/18/2017   LDLDIRECT 124.0 09/06/2016   LDLCALC 102 (H) 10/18/2017   ALT 13 10/18/2017   AST 17 10/18/2017   NA 139 03/04/2018   K 4.2 03/04/2018   CL 105 03/04/2018   CREATININE 1.24 03/04/2018   BUN 10 03/04/2018   CO2 25 03/04/2018   TSH 2.07 10/18/2017   PSA 5.70 (H) 10/12/2011   HGBA1C 6.1 10/18/2017    Lab Results  Component Value Date   TSH 2.07 10/18/2017   Lab Results  Component Value Date   WBC 5.1 03/04/2018   HGB 14.1  03/04/2018   HCT 43.5 03/04/2018   MCV 85.1 03/04/2018   PLT 145 (L) 03/04/2018   Lab Results  Component Value Date   NA 139 03/04/2018   K 4.2 03/04/2018   CO2 25 03/04/2018   GLUCOSE 105 (H) 03/04/2018   BUN 10 03/04/2018   CREATININE 1.24 03/04/2018   BILITOT 0.6 10/18/2017   ALKPHOS 49 10/18/2017   AST 17 10/18/2017   ALT 13 10/18/2017   PROT 7.5 10/18/2017   ALBUMIN 4.2 10/18/2017   CALCIUM 9.1 03/04/2018   ANIONGAP 9 03/04/2018   GFR 59.64 (L) 10/18/2017   Lab Results  Component Value Date   CHOL 167 10/18/2017   Lab Results  Component Value Date   HDL 41.90 10/18/2017   Lab Results  Component Value Date   LDLCALC 102 (H) 10/18/2017   Lab Results  Component Value Date   TRIG 114.0 10/18/2017   Lab Results  Component Value Date   CHOLHDL 4 10/18/2017   Lab Results  Component Value Date   HGBA1C 6.1 10/18/2017         Assessment & Plan:   Problem List Items Addressed This Visit    Benign essential HTN    Improved on recheck, no changes today      Thrombocytopenia ([facility])    Mild asymptomatic no chajnes      Hyperglycemia    hgba1c acceptable, minimize simple carbs. Increase exercise as tolerated.      Hyperlipidemia, mild    Encouraged heart healthy diet, increase exercise, avoid trans fats, consider a krill oil cap daily         I am having Steven Morrison maintain his aspirin EC, triamcinolone cream, nebivolol, amLODipine, and losartan.  No orders of the defined types were  placed in this encounter.   CMA served as Education administrator during this visit. History, Physical and Plan performed by medical provider. Documentation and orders reviewed and attested to.  [name], MD

## 2018-05-25 ENCOUNTER — Encounter: Payer: Self-pay | Admitting: Family Medicine

## 2018-05-25 ENCOUNTER — Ambulatory Visit (INDEPENDENT_AMBULATORY_CARE_PROVIDER_SITE_OTHER): Payer: Medicare Other | Admitting: Family Medicine

## 2018-05-25 DIAGNOSIS — I10 Essential (primary) hypertension: Secondary | ICD-10-CM | POA: Diagnosis not present

## 2018-05-25 DIAGNOSIS — R739 Hyperglycemia, unspecified: Secondary | ICD-10-CM | POA: Diagnosis not present

## 2018-05-25 DIAGNOSIS — K5900 Constipation, unspecified: Secondary | ICD-10-CM

## 2018-05-25 MED ORDER — MUPIROCIN 2 % EX OINT: 1.0000 "application " | TOPICAL_OINTMENT | Freq: Two times a day (BID) | CUTANEOUS | 1 refills | Status: DC

## 2018-05-25 NOTE — Patient Instructions (Addendum)
Shingrix is the new shingles shot, 2 shots over 2-6 months. At pharmacy  If a boil come up again, apply hot compresses and cleanse with gauze and hydrogen peroxide then apply mupirocin ointment twice a day. Call for evaluation if it continues to enlarge becomes painful or has fever so we can evaluate Hypertension Hypertension is another name for high blood pressure. High blood pressure forces your heart to work harder to pump blood. This can cause problems over time. There are two numbers in a blood pressure reading. There is a top number (systolic) over a bottom number (diastolic). It is best to have a blood pressure below 120/80. Healthy choices can help lower your blood pressure. You may need medicine to help lower your blood pressure if:  Your blood pressure cannot be lowered with healthy choices.  Your blood pressure is higher than 130/80.  Follow these instructions at home: Eating and drinking  If directed, follow the DASH eating plan. This diet includes: ? Filling half of your plate at each meal with fruits and vegetables. ? Filling one quarter of your plate at each meal with whole grains. Whole grains include whole wheat pasta, brown rice, and whole grain bread. ? Eating or drinking low-fat dairy products, such as skim milk or low-fat yogurt. ? Filling one quarter of your plate at each meal with low-fat (lean) proteins. Low-fat proteins include fish, skinless chicken, eggs, beans, and tofu. ? Avoiding fatty meat, cured and processed meat, or chicken with skin. ? Avoiding premade or processed food.  Eat less than 1,500 mg of salt (sodium) a day.  Limit alcohol use to no more than 1 drink a day for nonpregnant women and 2 drinks a day for men. One drink equals 12 oz of beer, 5 oz of wine, or 1 oz of hard liquor. Lifestyle  Work with your doctor to stay at a healthy weight or to lose weight. Ask your doctor what the best weight is for you.  Get at least 30 minutes of exercise that  causes your heart to beat faster (aerobic exercise) most days of the week. This may include walking, swimming, or biking.  Get at least 30 minutes of exercise that strengthens your muscles (resistance exercise) at least 3 days a week. This may include lifting weights or pilates.  Do not use any products that contain nicotine or tobacco. This includes cigarettes and e-cigarettes. If you need help quitting, ask your doctor.  Check your blood pressure at home as told by your doctor.  Keep all follow-up visits as told by your doctor. This is important. Medicines  Take over-the-counter and prescription medicines only as told by your doctor. Follow directions carefully.  Do not skip doses of blood pressure medicine. The medicine does not work as well if you skip doses. Skipping doses also puts you at risk for problems.  Ask your doctor about side effects or reactions to medicines that you should watch for. Contact a doctor if:  You think you are having a reaction to the medicine you are taking.  You have headaches that keep coming back (recurring).  You feel dizzy.  You have swelling in your ankles.  You have trouble with your vision. Get help right away if:  You get a very bad headache.  You start to feel confused.  You feel weak or numb.  You feel faint.  You get very bad pain in your: ? Chest. ? Belly (abdomen).  You throw up (vomit) more than once.  You have trouble breathing. Summary  Hypertension is another name for high blood pressure.  Making healthy choices can help lower blood pressure. If your blood pressure cannot be controlled with healthy choices, you may need to take medicine. This information is not intended to replace advice given to you by your health care provider. Make sure you discuss any questions you have with your health care provider. Document Released: 01/19/2008 Document Revised: 06/30/2016 Document Reviewed: 06/30/2016 Elsevier Interactive  Patient Education  [employer].

## 2018-05-28 NOTE — Assessment & Plan Note (Signed)
Well controlled for him, no changes to meds. Encouraged heart healthy diet such as the DASH diet and exercise as tolerated.

## 2018-05-28 NOTE — Assessment & Plan Note (Addendum)
maintain increased hydration and fiber in diet. Daily probiotics.

## 2018-05-28 NOTE — Assessment & Plan Note (Signed)
hgba1c acceptable, minimize simple carbs. Increase exercise as tolerated.  

## 2018-05-28 NOTE — Progress Notes (Signed)
Subjective:    Patient ID: Steven Morrison, male    DOB: 07-27-1928, 84 y.o.   MRN: [id number]  No chief complaint on file.   HPI Patient is in today for follow up. He reports he feels well. No recent febrile illness or hospitalizations. He reports his greatest worry is the stresss his adult daughter is under working full time and still taking cae of everyone else. Denies CP/palp/SOB/HA/congestion/fevers or GU c/o. Taking meds as prescribed. Does note some mild constipation at times.   Past Medical History:  Diagnosis Date  . Anxiety state, unspecified 12/19/2013  . Benign essential HTN 09/12/2011   Does not tolerate thiazide diuretics developed very bad dermatitis   . Constipation 02/10/2012  . Dermatitis 09/13/2012  . Elevated PSA 11/03/2011  . HTN (hypertension) 09/12/2011  . Hyperlipidemia, mild 04/17/2015  . Intertrigo 03/20/2012  . Medicare annual wellness visit, subsequent 09/12/2011  . Rash, skin 01/02/2012   Left lower leg  . Restless sleeper 01/04/2017  . Thrombocytopenia ([place]) 11/17/2011   slight  . Uncontrolled hypertension 09/12/2011  . Urinary hesitancy 10/12/2011  . Urinary urgency 10/12/2011    No past surgical history on file.  Family History  Problem Relation Age of Onset  . Migraines Daughter   . Allergies Daughter   . Allergies Son     Social History   Socioeconomic History  . Marital status: Divorced    Spouse name: Not on file  . Number of children: Not on file  . Years of education: Not on file  . Highest education level: Not on file  Occupational History  . Not on file  Social Needs  . Financial resource strain: Not on file  . Food insecurity:    Worry: Not on file    Inability: Not on file  . Transportation needs:    Medical: Not on file    Non-medical: Not on file  Tobacco Use  . Smoking status: Never Smoker  . Smokeless tobacco: Never Used  Substance and Sexual Activity  . Alcohol use: Yes    Comment: occasionally  . Drug use: No  .  Sexual activity: Not Currently  Lifestyle  . Physical activity:    Days per week: Not on file    Minutes per session: Not on file  . Stress: Not on file  Relationships  . Social connections:    Talks on phone: Not on file    Gets together: Not on file    Attends religious service: Not on file    Active member of club or organization: Not on file    Attends meetings of clubs or organizations: Not on file    Relationship status: Not on file  . Intimate partner violence:    Fear of current or ex partner: Not on file    Emotionally abused: Not on file    Physically abused: Not on file    Forced sexual activity: Not on file  Other Topics Concern  . Not on file  Social History Narrative  . Not on file    Outpatient Medications Prior to Visit  Medication Sig Dispense Refill  . amLODipine (NORVASC) 5 MG tablet 1 tablet by mouth ONCE daily 60 tablet 0  . aspirin EC 81 MG tablet Take 1 tablet (81 mg total) by mouth daily.    Marland Kitchen losartan (COZAAR) 50 MG tablet Take 1 tablet (50 mg total) by mouth daily. 90 tablet 3  . nebivolol (BYSTOLIC) 10 MG tablet Take 1 tablet (10  mg total) by mouth daily. 60 tablet 3  . triamcinolone cream (KENALOG) 0.1 % Apply 1 application topically 2 (two) times daily. 80 g 1   No facility-administered medications prior to visit.     Allergies  Allergen Reactions  . Hctz [Hydrochlorothiazide] Rash  . Bactrim [Sulfamethoxazole-Trimethoprim] Rash  . Losartan Palpitations    Palpitations and fever    Review of Systems  Constitutional: Negative for fever and malaise/fatigue.  HENT: Negative for congestion.   Eyes: Negative for blurred vision.  Respiratory: Negative for shortness of breath.   Cardiovascular: Negative for chest pain, palpitations and leg swelling.  Gastrointestinal: Positive for constipation. Negative for abdominal pain, blood in stool and nausea.  Genitourinary: Negative for dysuria and frequency.  Musculoskeletal: Negative for falls.    Skin: Negative for rash.  Neurological: Negative for dizziness, loss of consciousness and headaches.  Endo/Heme/Allergies: Negative for environmental allergies.  Psychiatric/Behavioral: Negative for depression. The patient is not nervous/anxious.        Objective:    Physical Exam  Constitutional: He is oriented to person, place, and time. He appears well-developed and well-nourished. No distress.  HENT:  Head: Normocephalic and atraumatic.  Nose: Nose normal.  Eyes: Right eye exhibits no discharge. Left eye exhibits no discharge.  Neck: Normal range of motion. Neck supple.  Cardiovascular: Normal rate and regular rhythm.  Pulmonary/Chest: Effort normal and breath sounds normal.  Abdominal: Soft. Bowel sounds are normal. There is no tenderness.  Musculoskeletal: He exhibits no edema.  Neurological: He is alert and oriented to person, place, and time.  Skin: Skin is warm and dry.  Psychiatric: He has a normal mood and affect.  Nursing note and vitals reviewed.   BP (!) 148/52   Pulse (!) 53   Temp 97.9 F (36.6 C) (Oral)   Resp 18   Ht 5\' 2"  (1.575 m)   Wt 135 lb 6.4 oz (61.4 kg)   SpO2 98%   BMI 24.76 kg/m  Wt Readings from Last 3 Encounters:  05/25/18 135 lb 6.4 oz (61.4 kg)  04/20/18 135 lb (61.2 kg)  04/20/18 135 lb 3.2 oz (61.3 kg)     Lab Results  Component Value Date   WBC 5.1 03/04/2018   HGB 14.1 03/04/2018   HCT 43.5 03/04/2018   PLT 145 (L) 03/04/2018   GLUCOSE 105 (H) 03/04/2018   CHOL 167 10/18/2017   TRIG 114.0 10/18/2017   HDL 41.90 10/18/2017   LDLDIRECT 124.0 09/06/2016   LDLCALC 102 (H) 10/18/2017   ALT 13 10/18/2017   AST 17 10/18/2017   NA 139 03/04/2018   K 4.2 03/04/2018   CL 105 03/04/2018   CREATININE 1.24 03/04/2018   BUN 10 03/04/2018   CO2 25 03/04/2018   TSH 2.07 10/18/2017   PSA 5.70 (H) 10/12/2011   HGBA1C 6.1 10/18/2017    Lab Results  Component Value Date   TSH 2.07 10/18/2017   Lab Results  Component Value Date    WBC 5.1 03/04/2018   HGB 14.1 03/04/2018   HCT 43.5 03/04/2018   MCV 85.1 03/04/2018   PLT 145 (L) 03/04/2018   Lab Results  Component Value Date   NA 139 03/04/2018   K 4.2 03/04/2018   CO2 25 03/04/2018   GLUCOSE 105 (H) 03/04/2018   BUN 10 03/04/2018   CREATININE 1.24 03/04/2018   BILITOT 0.6 10/18/2017   ALKPHOS 49 10/18/2017   AST 17 10/18/2017   ALT 13 10/18/2017   PROT 7.5 10/18/2017  ALBUMIN 4.2 10/18/2017   CALCIUM 9.1 03/04/2018   ANIONGAP 9 03/04/2018   GFR 59.64 (L) 10/18/2017   Lab Results  Component Value Date   CHOL 167 10/18/2017   Lab Results  Component Value Date   HDL 41.90 10/18/2017   Lab Results  Component Value Date   LDLCALC 102 (H) 10/18/2017   Lab Results  Component Value Date   TRIG 114.0 10/18/2017   Lab Results  Component Value Date   CHOLHDL 4 10/18/2017   Lab Results  Component Value Date   HGBA1C 6.1 10/18/2017       Assessment & Plan:   Problem List Items Addressed This Visit    Benign essential HTN    Well controlled for him, no changes to meds. Encouraged heart healthy diet such as the DASH diet and exercise as tolerated.       Constipation    maintain increased hydration and fiber in diet. Daily probiotics.       Hyperglycemia    hgba1c acceptable, minimize simple carbs. Increase exercise as tolerated.          I am having Mr. Steven Morrison start on mupirocin ointment. I am also having him maintain his aspirin EC, triamcinolone cream, nebivolol, amLODipine, and losartan.  Meds ordered this encounter  Medications  . mupirocin ointment (BACTROBAN) 2 %    Sig: Place 1 application into the nose 2 (two) times daily.    Dispense:  22 g    Refill:  1     [name], MD

## 2018-06-05 NOTE — Progress Notes (Signed)
Cardiology Office Note   Date:  06/06/2018   ID:  [name], [name] 1927/12/14, MRN [id number]  PCP:  [name], MD  Cardiologist: Dr.Arida  Chief Complaint  Patient presents with  . Follow-up  . Hypertension  . Hyperlipidemia     History of Present Illness: Steven Morrison is a 84 y.o. male who presents for ongoing assessment and management of HTN, medical non-compliance history, difficult to control BP due to multiple intolerances and compliance issues. Other history includes hyperlipidemia, anxiety. Was seen by PCP, Dr. Charlett Blake on 05/25/2018 with BP of 148/52   He comes today without complaints. His BP has been harder to control the last few weeks. He states that he is not sleeping well. He is taking his medications as directed. He denies chest pain,   Past Medical History:  Diagnosis Date  . Anxiety state, unspecified 12/19/2013  . Benign essential HTN 09/12/2011   Does not tolerate thiazide diuretics developed very bad dermatitis   . Constipation 02/10/2012  . Dermatitis 09/13/2012  . Elevated PSA 11/03/2011  . HTN (hypertension) 09/12/2011  . Hyperlipidemia, mild 04/17/2015  . Intertrigo 03/20/2012  . Medicare annual wellness visit, subsequent 09/12/2011  . Rash, skin 01/02/2012   Left lower leg  . Restless sleeper 01/04/2017  . Thrombocytopenia ([place]) 11/17/2011   slight  . Uncontrolled hypertension 09/12/2011  . Urinary hesitancy 10/12/2011  . Urinary urgency 10/12/2011    No past surgical history on file.   Current Outpatient Medications  Medication Sig Dispense Refill  . amLODipine (NORVASC) 10 MG tablet 1 tablet by mouth ONCE daily 30 tablet 1  . aspirin EC 81 MG tablet Take 1 tablet (81 mg total) by mouth daily.    Marland Kitchen losartan (COZAAR) 50 MG tablet Take 1 tablet (50 mg total) by mouth daily. 90 tablet 3  . mupirocin ointment (BACTROBAN) 2 % Place 1 application into the nose 2 (two) times daily. 22 g 1  . nebivolol (BYSTOLIC) 10 MG tablet Take 1 tablet (10 mg  total) by mouth daily. 60 tablet 3  . triamcinolone cream (KENALOG) 0.1 % Apply 1 application topically 2 (two) times daily. 80 g 1   No current facility-administered medications for this visit.     Allergies:   Hctz [hydrochlorothiazide]; Bactrim [sulfamethoxazole-trimethoprim]; and Losartan    Social History:  The patient  reports that he has never smoked. He has never used smokeless tobacco. He reports that he drinks alcohol. He reports that he does not use drugs.   Family History:  The patient's family history includes Allergies in his daughter and son; Migraines in his daughter.    ROS: All other systems are reviewed and negative. Unless otherwise mentioned in H&P    PHYSICAL EXAM: VS:  BP (!) 164/68   Pulse 68   Ht 5\' 2"  (1.575 m)   Wt 137 lb (62.1 kg)   BMI 25.06 kg/m  , BMI Body mass index is 25.06 kg/m. GEN: Well nourished, well developed, in no acute distress HEENT: normal Neck: no JVD, carotid bruits, or masses Cardiac: RRR; no murmurs, rubs, or gallops,no edema  Respiratory:  Clear to auscultation bilaterally, normal work of breathing GI: soft, nontender, nondistended, + BS MS: no deformity or atrophy Skin: warm and dry, no rash Neuro:  Strength and sensation are intact Psych: euthymic mood, full affect   EKG: Recent Labs: 10/18/2017: ALT 13; TSH 2.07 03/04/2018: BUN 10; Creatinine, Ser 1.24; Hemoglobin 14.1; Platelets 145; Potassium 4.2; Sodium 139  Lipid Panel    Component Value Date/Time   CHOL 167 10/18/2017 1133   TRIG 114.0 10/18/2017 1133   HDL 41.90 10/18/2017 1133   CHOLHDL 4 10/18/2017 1133   VLDL 22.8 10/18/2017 1133   LDLCALC 102 (H) 10/18/2017 1133   LDLDIRECT 124.0 09/06/2016 1403      Wt Readings from Last 3 Encounters:  06/06/18 137 lb (62.1 kg)  05/25/18 135 lb 6.4 oz (61.4 kg)  04/20/18 135 lb (61.2 kg)      Other studies Reviewed: Echocardiogram 01/11/14 Left ventricle: The cavity size was normal. Wall thickness  was increased in a pattern of moderate LVH. Systolic function was normal. The estimated ejection fraction was in the range of 55% to 60%. Left ventricular diastolic function parameters were normal. - Left atrium: The atrium was moderately dilated. - Atrial septum: No defect or patent foramen ovale was identified.  ASSESSMENT AND PLAN:  1. Hypertension:  His BP is not well controlled today. He states that his BP has been high at home too. I looked at prior BP in the last month as well. Will increase amlodipine to 10 mg daily from 5 mg daily. He is to keep up with his BP at home and report significant elevations or significant drops.   2. Hyperlipidemia: He is followed by PCP with labs every 6 months.   Current medicines are reviewed at length with the patient today.    Labs/ tests ordered today include: None  Phill Myron. West Pugh, ANP, AACC   06/06/2018 11:44 AM    [address] Office [phone] Fax [phone]

## 2018-06-06 ENCOUNTER — Ambulatory Visit (INDEPENDENT_AMBULATORY_CARE_PROVIDER_SITE_OTHER): Payer: Medicare Other | Admitting: Adult Health

## 2018-06-06 ENCOUNTER — Encounter: Payer: Self-pay | Admitting: Adult Health

## 2018-06-06 VITALS — BP 164/68 | HR 68 | Ht 62.0 in | Wt 137.0 lb

## 2018-06-06 DIAGNOSIS — E7800 Pure hypercholesterolemia, unspecified: Secondary | ICD-10-CM

## 2018-06-06 DIAGNOSIS — I10 Essential (primary) hypertension: Secondary | ICD-10-CM

## 2018-06-06 MED ORDER — NEBIVOLOL HCL 10 MG PO TABS
10.0000 mg | ORAL_TABLET | Freq: Every day | ORAL | 3 refills | Status: DC
Start: 2018-06-06 — End: 2019-03-19

## 2018-06-06 MED ORDER — AMLODIPINE BESYLATE 10 MG PO TABS: ORAL_TABLET | ORAL | 1 refills | Status: DC

## 2018-06-06 MED ORDER — LOSARTAN POTASSIUM 50 MG PO TABS: 50.0000 mg | ORAL_TABLET | Freq: Every day | ORAL | 3 refills | Status: DC

## 2018-06-06 NOTE — Patient Instructions (Signed)
Medication Instructions:  Increase amlodipine 10mg  daily  If you need a refill on your cardiac medications before your next appointment, please call your pharmacy.  Labwork: If you have labs (blood work) drawn today and your tests are completely normal, you will receive your results only by: Marland Kitchen MyChart Message (if you have MyChart) OR . A paper copy in the mail If you have any lab test that is abnormal or we need to change your treatment, we will call you to review the results.  Follow-Up: You will need a follow up appointment in 6 months.  Please call our office 2 months in advance(FEBRUARY 2020) to schedule the (April 2020) appointment.  You may see  DR Fletcher Anon or one of the following Advanced Practice Providers on your designated Care Team:   . Jory Sims, DNP, ANP -OR) . Kerin Ransom, PA-C  At [facility], you and your health needs are our priority.  As part of our continuing mission to provide you with exceptional heart care, we have created designated Provider Care Teams.  These Care Teams include your primary Cardiologist (physician) and Advanced Practice Providers (APPs -  Physician Assistants and Nurse Practitioners) who all work together to provide you with the care you need, when you need it.  Thank you for choosing [facility]!!

## 2018-09-08 ENCOUNTER — Encounter: Payer: Self-pay | Admitting: Family Medicine

## 2018-09-08 ENCOUNTER — Ambulatory Visit (INDEPENDENT_AMBULATORY_CARE_PROVIDER_SITE_OTHER): Payer: Medicare Other | Admitting: Family Medicine

## 2018-09-08 DIAGNOSIS — H6123 Impacted cerumen, bilateral: Secondary | ICD-10-CM

## 2018-09-08 DIAGNOSIS — I10 Essential (primary) hypertension: Secondary | ICD-10-CM

## 2018-09-08 DIAGNOSIS — H60502 Unspecified acute noninfective otitis externa, left ear: Secondary | ICD-10-CM

## 2018-09-08 MED ORDER — NEOMYCIN-COLIST-HC-THONZONIUM 3.3-3-10-0.5 MG/ML OT SUSP: 3.0000 [drp] | Freq: Three times a day (TID) | OTIC | 0 refills | Status: DC

## 2018-09-08 NOTE — Patient Instructions (Signed)
Otitis Externa    Otitis externa is an infection of the outer ear canal. The outer ear canal is the area between the outside of the ear and the eardrum. Otitis externa is sometimes called swimmer's ear.  What are the causes?  Common causes of this condition include:   Swimming in dirty water.   Moisture in the ear.   An injury to the inside of the ear.   An object stuck in the ear.   A cut or scrape on the outside of the ear.  What increases the risk?  You are more likely to develop this condition if you go swimming often.  What are the signs or symptoms?  The first symptom of this condition is often itching in the ear. Later symptoms of the condition include:   Swelling of the ear.   Redness in the ear.   Ear pain. The pain may get worse when you pull on your ear.   Pus coming from the ear.  How is this diagnosed?  This condition may be diagnosed by examining the ear and testing fluid from the ear for bacteria and funguses.  How is this treated?  This condition may be treated with:   Antibiotic ear drops. These are often given for 10-14 days.   Medicines to reduce itching and swelling.  Follow these instructions at home:   If you were prescribed antibiotic ear drops, use them as told by your health care provider. Do not stop using the antibiotic even if your condition improves.   Take over-the-counter and prescription medicines only as told by your health care provider.   Avoid getting water in your ears as told by your health care provider. This may include avoiding swimming or water sports for a few days.   Keep all follow-up visits as told by your health care provider. This is important.  How is this prevented?   Keep your ears dry. Use the corner of a towel to dry your ears after you swim or bathe.   Avoid scratching or putting things in your ear. Doing these things can damage the ear canal or remove the protective wax that lines it, which makes it easier for bacteria and funguses to  grow.   Avoid swimming in lakes, polluted water, or pools that may not have enough chlorine.  Contact a health care provider if:   You have a fever.   Your ear is still red, swollen, painful, or draining pus after 3 days.   Your redness, swelling, or pain gets worse.   You have a severe headache.   You have redness, swelling, pain, or tenderness in the area behind your ear.  Summary   Otitis externa is an infection of the outer ear canal.   Common causes include swimming in dirty water, moisture in the ear, or a cut or scrape in the ear.   Symptoms include pain, redness, and swelling of the ear.   If you were prescribed antibiotic ear drops, use them as told by your health care provider. Do not stop using the antibiotic even if your condition improves.  This information is not intended to replace advice given to you by your health care provider. Make sure you discuss any questions you have with your health care provider.  Document Released: 08/02/2005 Document Revised: 01/06/2018 Document Reviewed: 01/06/2018  Elsevier Interactive Patient Education  2019 [employer]

## 2018-09-10 DIAGNOSIS — H6120 Impacted cerumen, unspecified ear: Secondary | ICD-10-CM | POA: Insufficient documentation

## 2018-09-10 DIAGNOSIS — H6092 Unspecified otitis externa, left ear: Secondary | ICD-10-CM | POA: Insufficient documentation

## 2018-09-10 NOTE — Assessment & Plan Note (Signed)
Mildly elevated. no changes to meds. Encouraged heart healthy diet such as the DASH diet and exercise as tolerated.  

## 2018-09-10 NOTE — Assessment & Plan Note (Signed)
rx for Cortisporin otic to use tid

## 2018-09-10 NOTE — Progress Notes (Signed)
Subjective:    Patient ID: Steven Morrison, male    DOB: Apr 11, 1928, 85 y.o.   MRN: [id number]  No chief complaint on file.   HPI Patient is in today for evaluation of cerumen. He had tried to manage at home but was unsuccessful. No pain or fevers but he does note a decrease in hearing. Denies CP/palp/SOB/HA/congestion/fevers/GI or GU c/o. Taking meds as prescribed  Past Medical History:  Diagnosis Date  . Anxiety state, unspecified 12/19/2013  . Benign essential HTN 09/12/2011   Does not tolerate thiazide diuretics developed very bad dermatitis   . Constipation 02/10/2012  . Dermatitis 09/13/2012  . Elevated PSA 11/03/2011  . HTN (hypertension) 09/12/2011  . Hyperlipidemia, mild 04/17/2015  . Intertrigo 03/20/2012  . Medicare annual wellness visit, subsequent 09/12/2011  . Rash, skin 01/02/2012   Left lower leg  . Restless sleeper 01/04/2017  . Thrombocytopenia ([place]) 11/17/2011   slight  . Uncontrolled hypertension 09/12/2011  . Urinary hesitancy 10/12/2011  . Urinary urgency 10/12/2011    No past surgical history on file.  Family History  Problem Relation Age of Onset  . Migraines Daughter   . Allergies Daughter   . Allergies Son     Social History   Socioeconomic History  . Marital status: Divorced    Spouse name: Not on file  . Number of children: Not on file  . Years of education: Not on file  . Highest education level: Not on file  Occupational History  . Not on file  Social Needs  . Financial resource strain: Not on file  . Food insecurity:    Worry: Not on file    Inability: Not on file  . Transportation needs:    Medical: Not on file    Non-medical: Not on file  Tobacco Use  . Smoking status: Never Smoker  . Smokeless tobacco: Never Used  Substance and Sexual Activity  . Alcohol use: Yes    Comment: occasionally  . Drug use: No  . Sexual activity: Not Currently  Lifestyle  . Physical activity:    Days per week: Not on file    Minutes per session: Not  on file  . Stress: Not on file  Relationships  . Social connections:    Talks on phone: Not on file    Gets together: Not on file    Attends religious service: Not on file    Active member of club or organization: Not on file    Attends meetings of clubs or organizations: Not on file    Relationship status: Not on file  . Intimate partner violence:    Fear of current or ex partner: Not on file    Emotionally abused: Not on file    Physically abused: Not on file    Forced sexual activity: Not on file  Other Topics Concern  . Not on file  Social History Narrative  . Not on file    Outpatient Medications Prior to Visit  Medication Sig Dispense Refill  . amLODipine (NORVASC) 10 MG tablet 1 tablet by mouth ONCE daily 30 tablet 1  . aspirin EC 81 MG tablet Take 1 tablet (81 mg total) by mouth daily.    Marland Kitchen losartan (COZAAR) 50 MG tablet Take 1 tablet (50 mg total) by mouth daily. 90 tablet 3  . mupirocin ointment (BACTROBAN) 2 % Place 1 application into the nose 2 (two) times daily. 22 g 1  . nebivolol (BYSTOLIC) 10 MG tablet Take 1 tablet (  10 mg total) by mouth daily. 60 tablet 3  . triamcinolone cream (KENALOG) 0.1 % Apply 1 application topically 2 (two) times daily. 80 g 1   No facility-administered medications prior to visit.     Allergies  Allergen Reactions  . Hctz [Hydrochlorothiazide] Rash  . Bactrim [Sulfamethoxazole-Trimethoprim] Rash  . Losartan Palpitations    Palpitations and fever    Review of Systems  Constitutional: Negative for fever and malaise/fatigue.  HENT: Positive for hearing loss. Negative for congestion, ear discharge and ear pain.   Eyes: Negative for blurred vision.  Respiratory: Negative for shortness of breath.   Cardiovascular: Negative for chest pain, palpitations and leg swelling.  Gastrointestinal: Negative for abdominal pain, blood in stool and nausea.  Genitourinary: Negative for dysuria and frequency.  Musculoskeletal: Negative for falls.    Skin: Negative for rash.  Neurological: Negative for dizziness, loss of consciousness and headaches.  Endo/Heme/Allergies: Negative for environmental allergies.  Psychiatric/Behavioral: Negative for depression. The patient is not nervous/anxious.        Objective:    Physical Exam Vitals signs and nursing note reviewed.  Constitutional:      General: He is not in acute distress.    Appearance: He is well-developed.  HENT:     Head: Normocephalic and atraumatic.     Right Ear: Tympanic membrane and ear canal normal.     Left Ear: Tympanic membrane normal.     Ears:     Comments: After cerumen removed. Left external canal erythematous and macerated.     Nose: Nose normal.  Eyes:     General:        Right eye: No discharge.        Left eye: No discharge.  Neck:     Musculoskeletal: Normal range of motion and neck supple.  Cardiovascular:     Rate and Rhythm: Normal rate and regular rhythm.     Heart sounds: No murmur.  Pulmonary:     Effort: Pulmonary effort is normal.     Breath sounds: Normal breath sounds.  Abdominal:     General: Bowel sounds are normal.     Palpations: Abdomen is soft.     Tenderness: There is no abdominal tenderness.  Skin:    General: Skin is warm and dry.  Neurological:     Mental Status: He is alert and oriented to person, place, and time.     BP (!) 150/72   Pulse (!) 58   Temp 98.3 F (36.8 C) (Oral)   Resp 18   Wt 142 lb (64.4 kg)   HC 1" (2.5 cm)   SpO2 98%   BMI 25.97 kg/m  Wt Readings from Last 3 Encounters:  09/08/18 142 lb (64.4 kg)  06/06/18 137 lb (62.1 kg)  05/25/18 135 lb 6.4 oz (61.4 kg)     Lab Results  Component Value Date   WBC 5.1 03/04/2018   HGB 14.1 03/04/2018   HCT 43.5 03/04/2018   PLT 145 (L) 03/04/2018   GLUCOSE 105 (H) 03/04/2018   CHOL 167 10/18/2017   TRIG 114.0 10/18/2017   HDL 41.90 10/18/2017   LDLDIRECT 124.0 09/06/2016   LDLCALC 102 (H) 10/18/2017   ALT 13 10/18/2017   AST 17 10/18/2017    NA 139 03/04/2018   K 4.2 03/04/2018   CL 105 03/04/2018   CREATININE 1.24 03/04/2018   BUN 10 03/04/2018   CO2 25 03/04/2018   TSH 2.07 10/18/2017   PSA 5.70 (H) 10/12/2011  HGBA1C 6.1 10/18/2017    Lab Results  Component Value Date   TSH 2.07 10/18/2017   Lab Results  Component Value Date   WBC 5.1 03/04/2018   HGB 14.1 03/04/2018   HCT 43.5 03/04/2018   MCV 85.1 03/04/2018   PLT 145 (L) 03/04/2018   Lab Results  Component Value Date   NA 139 03/04/2018   K 4.2 03/04/2018   CO2 25 03/04/2018   GLUCOSE 105 (H) 03/04/2018   BUN 10 03/04/2018   CREATININE 1.24 03/04/2018   BILITOT 0.6 10/18/2017   ALKPHOS 49 10/18/2017   AST 17 10/18/2017   ALT 13 10/18/2017   PROT 7.5 10/18/2017   ALBUMIN 4.2 10/18/2017   CALCIUM 9.1 03/04/2018   ANIONGAP 9 03/04/2018   GFR 59.64 (L) 10/18/2017   Lab Results  Component Value Date   CHOL 167 10/18/2017   Lab Results  Component Value Date   HDL 41.90 10/18/2017   Lab Results  Component Value Date   LDLCALC 102 (H) 10/18/2017   Lab Results  Component Value Date   TRIG 114.0 10/18/2017   Lab Results  Component Value Date   CHOLHDL 4 10/18/2017   Lab Results  Component Value Date   HGBA1C 6.1 10/18/2017       Assessment & Plan:   Problem List Items Addressed This Visit    Benign essential HTN    Mildly elevated. no changes to meds. Encouraged heart healthy diet such as the DASH diet and exercise as tolerated.       Cerumen impaction    Attempted to remove wax manually was unable. Ears were flushed and he tolerated well with good success.      Otitis externa of left ear    rx for Cortisporin otic to use tid         I am having Mr. Raequan C. Bilger start on neomycin-colistin-hydrocortisone-thonzonium. I am also having him maintain his aspirin EC, triamcinolone cream, mupirocin ointment, losartan, nebivolol, and amLODipine.  Meds ordered this encounter  Medications  .  neomycin-colistin-hydrocortisone-thonzonium (CORTISPORIN-TC) 3.10-16-08-0.5 MG/ML OTIC suspension    Sig: Place 3 drops into the left ear 3 (three) times daily.    Dispense:  10 mL    Refill:  0     [name], MD

## 2018-09-10 NOTE — Assessment & Plan Note (Signed)
Attempted to remove wax manually was unable. Ears were flushed and he tolerated well with good success.

## 2018-10-19 ENCOUNTER — Encounter: Payer: Self-pay | Admitting: Family Medicine

## 2018-10-19 ENCOUNTER — Ambulatory Visit (INDEPENDENT_AMBULATORY_CARE_PROVIDER_SITE_OTHER): Payer: Medicare Other | Admitting: Family Medicine

## 2018-10-19 VITALS — BP 142/78 | HR 80 | Temp 98.1°F | Resp 18 | Wt 142.4 lb

## 2018-10-19 DIAGNOSIS — E785 Hyperlipidemia, unspecified: Secondary | ICD-10-CM | POA: Diagnosis not present

## 2018-10-19 DIAGNOSIS — H6123 Impacted cerumen, bilateral: Secondary | ICD-10-CM

## 2018-10-19 DIAGNOSIS — I10 Essential (primary) hypertension: Secondary | ICD-10-CM

## 2018-10-19 DIAGNOSIS — R739 Hyperglycemia, unspecified: Secondary | ICD-10-CM | POA: Diagnosis not present

## 2018-10-19 LAB — CBC
HCT: 42.6 % (ref 39.0–52.0)
Hemoglobin: 13.8 g/dL (ref 13.0–17.0)
MCHC: 32.4 g/dL (ref 30.0–36.0)
MCV: 85 fl (ref 78.0–100.0)
Platelets: 114 10*3/uL — ABNORMAL LOW (ref 150.0–400.0)
RBC: 5 Mil/uL (ref 4.22–5.81)
RDW: 14.4 % (ref 11.5–15.5)
WBC: 6 10*3/uL (ref 4.0–10.5)

## 2018-10-19 LAB — COMPREHENSIVE METABOLIC PANEL
ALT: 19 U/L (ref 0–53)
AST: 21 U/L (ref 0–37)
Albumin: 4.3 g/dL (ref 3.5–5.2)
Alkaline Phosphatase: 56 U/L (ref 39–117)
BUN: 12 mg/dL (ref 6–23)
CALCIUM: 8.9 mg/dL (ref 8.4–10.5)
CHLORIDE: 101 meq/L (ref 96–112)
CO2: 24 meq/L (ref 19–32)
CREATININE: 1.23 mg/dL (ref 0.40–1.50)
GFR: 55.46 mL/min — ABNORMAL LOW (ref >60.00)
GLUCOSE: 78 mg/dL (ref 70–99)
Potassium: 4.4 mEq/L (ref 3.5–5.1)
SODIUM: 137 meq/L (ref 135–145)
Total Bilirubin: 0.4 mg/dL (ref 0.2–1.2)
Total Protein: 7.3 g/dL (ref 6.0–8.3)

## 2018-10-19 LAB — LIPID PANEL
CHOL/HDL RATIO: 4
Cholesterol: 153 mg/dL (ref 0–200)
HDL: 42.7 mg/dL (ref >39.00)
LDL CALC: 84 mg/dL (ref 0–99)
NONHDL: 110.27
TRIGLYCERIDES: 129 mg/dL (ref 0.0–149.0)
VLDL: 25.8 mg/dL (ref 0.0–40.0)

## 2018-10-19 LAB — HEMOGLOBIN A1C: HEMOGLOBIN A1C: 6 % (ref 4.6–6.5)

## 2018-10-19 LAB — TSH: TSH: 1.82 u[IU]/mL (ref 0.35–4.50)

## 2018-10-19 NOTE — Assessment & Plan Note (Signed)
minimize simple carbs. Increase exercise as tolerated.  

## 2018-10-19 NOTE — Patient Instructions (Signed)
Shingrix is the new shingles shot 2 shots over 2-6 months at pharmacy Hypertension Hypertension, commonly called high blood pressure, is when the force of blood pumping through the arteries is too strong. The arteries are the blood vessels that carry blood from the heart throughout the body. Hypertension forces the heart to work harder to pump blood and may cause arteries to become narrow or stiff. Having untreated or uncontrolled hypertension can cause heart attacks, strokes, kidney disease, and other problems. A blood pressure reading consists of a higher number over a lower number. Ideally, your blood pressure should be below 120/80. The first ("top") number is called the systolic pressure. It is a measure of the pressure in your arteries as your heart beats. The second ("bottom") number is called the diastolic pressure. It is a measure of the pressure in your arteries as the heart relaxes. What are the causes? The cause of this condition is not known. What increases the risk? Some risk factors for high blood pressure are under your control. Others are not. Factors you can change  Smoking.  Having type 2 diabetes mellitus, high cholesterol, or both.  Not getting enough exercise or physical activity.  Being overweight.  Having too much fat, sugar, calories, or salt (sodium) in your diet.  Drinking too much alcohol. Factors that are difficult or impossible to change  Having chronic kidney disease.  Having a family history of high blood pressure.  Age. Risk increases with age.  Race. You may be at higher risk if you are African-American.  Gender. Men are at higher risk than women before age 77. After age 80, women are at higher risk than men.  Having obstructive sleep apnea.  Stress. What are the signs or symptoms? Extremely high blood pressure (hypertensive crisis) may cause:  Headache.  Anxiety.  Shortness of breath.  Nosebleed.  Nausea and vomiting.  Severe chest  pain.  Jerky movements you cannot control (seizures). How is this diagnosed? This condition is diagnosed by measuring your blood pressure while you are seated, with your arm resting on a surface. The cuff of the blood pressure monitor will be placed directly against the skin of your upper arm at the level of your heart. It should be measured at least twice using the same arm. Certain conditions can cause a difference in blood pressure between your right and left arms. Certain factors can cause blood pressure readings to be lower or higher than normal (elevated) for a short period of time:  When your blood pressure is higher when you are in a health care provider's office than when you are at home, this is called white coat hypertension. Most people with this condition do not need medicines.  When your blood pressure is higher at home than when you are in a health care provider's office, this is called masked hypertension. Most people with this condition may need medicines to control blood pressure. If you have a high blood pressure reading during one visit or you have normal blood pressure with other risk factors:  You may be asked to return on a different day to have your blood pressure checked again.  You may be asked to monitor your blood pressure at home for 1 week or longer. If you are diagnosed with hypertension, you may have other blood or imaging tests to help your health care provider understand your overall risk for other conditions. How is this treated? This condition is treated by making healthy lifestyle changes, such as eating  healthy foods, exercising more, and reducing your alcohol intake. Your health care provider may prescribe medicine if lifestyle changes are not enough to get your blood pressure under control, and if:  Your systolic blood pressure is above 130.  Your diastolic blood pressure is above 80. Your personal target blood pressure may vary depending on your medical  conditions, your age, and other factors. Follow these instructions at home: Eating and drinking   Eat a diet that is high in fiber and potassium, and low in sodium, added sugar, and fat. An example eating plan is called the DASH (Dietary Approaches to Stop Hypertension) diet. To eat this way: ? Eat plenty of fresh fruits and vegetables. Try to fill half of your plate at each meal with fruits and vegetables. ? Eat whole grains, such as whole wheat pasta, brown rice, or whole grain bread. Fill about one quarter of your plate with whole grains. ? Eat or drink low-fat dairy products, such as skim milk or low-fat yogurt. ? Avoid fatty cuts of meat, processed or cured meats, and poultry with skin. Fill about one quarter of your plate with lean proteins, such as fish, chicken without skin, beans, eggs, and tofu. ? Avoid premade and processed foods. These tend to be higher in sodium, added sugar, and fat.  Reduce your daily sodium intake. Most people with hypertension should eat less than 1,500 mg of sodium a day.  Limit alcohol intake to no more than 1 drink a day for nonpregnant women and 2 drinks a day for men. One drink equals 12 oz of beer, 5 oz of wine, or 1 oz of hard liquor. Lifestyle   Work with your health care provider to maintain a healthy body weight or to lose weight. Ask what an ideal weight is for you.  Get at least 30 minutes of exercise that causes your heart to beat faster (aerobic exercise) most days of the week. Activities may include walking, swimming, or biking.  Include exercise to strengthen your muscles (resistance exercise), such as pilates or lifting weights, as part of your weekly exercise routine. Try to do these types of exercises for 30 minutes at least 3 days a week.  Do not use any products that contain nicotine or tobacco, such as cigarettes and e-cigarettes. If you need help quitting, ask your health care provider.  Monitor your blood pressure at home as told by  your health care provider.  Keep all follow-up visits as told by your health care provider. This is important. Medicines  Take over-the-counter and prescription medicines only as told by your health care provider. Follow directions carefully. Blood pressure medicines must be taken as prescribed.  Do not skip doses of blood pressure medicine. Doing this puts you at risk for problems and can make the medicine less effective.  Ask your health care provider about side effects or reactions to medicines that you should watch for. Contact a health care provider if:  You think you are having a reaction to a medicine you are taking.  You have headaches that keep coming back (recurring).  You feel dizzy.  You have swelling in your ankles.  You have trouble with your vision. Get help right away if:  You develop a severe headache or confusion.  You have unusual weakness or numbness.  You feel faint.  You have severe pain in your chest or abdomen.  You vomit repeatedly.  You have trouble breathing. Summary  Hypertension is when the force of blood pumping  through your arteries is too strong. If this condition is not controlled, it may put you at risk for serious complications.  Your personal target blood pressure may vary depending on your medical conditions, your age, and other factors. For most people, a normal blood pressure is less than 120/80.  Hypertension is treated with lifestyle changes, medicines, or a combination of both. Lifestyle changes include weight loss, eating a healthy, low-sodium diet, exercising more, and limiting alcohol. This information is not intended to replace advice given to you by your health care provider. Make sure you discuss any questions you have with your health care provider. Document Released: 08/02/2005 Document Revised: 06/30/2016 Document Reviewed: 06/30/2016 Elsevier Interactive Patient Education  2019 [employer].

## 2018-10-19 NOTE — Assessment & Plan Note (Addendum)
Improved on recheck, no changes to meds. Encouraged heart healthy diet such as the DASH diet and exercise as tolerated.

## 2018-10-19 NOTE — Progress Notes (Signed)
Subjective:    Patient ID: Steven Morrison, male    DOB: 1928/07/30, 85 y.o.   MRN: [id number]  No chief complaint on file.   HPI Patient is in today for follow up. She feels well. No recent illness or hospitalizations. No acute concerns and he reports he is doing well at present. Denies CP/palp/SOB/HA/congestion/fevers/GI or GU c/o. Taking meds as prescribed  Past Medical History:  Diagnosis Date  . Anxiety state, unspecified 12/19/2013  . Benign essential HTN 09/12/2011   Does not tolerate thiazide diuretics developed very bad dermatitis   . Constipation 02/10/2012  . Dermatitis 09/13/2012  . Elevated PSA 11/03/2011  . HTN (hypertension) 09/12/2011  . Hyperlipidemia, mild 04/17/2015  . Intertrigo 03/20/2012  . Medicare annual wellness visit, subsequent 09/12/2011  . Rash, skin 01/02/2012   Left lower leg  . Restless sleeper 01/04/2017  . Thrombocytopenia ([place]) 11/17/2011   slight  . Uncontrolled hypertension 09/12/2011  . Urinary hesitancy 10/12/2011  . Urinary urgency 10/12/2011    History reviewed. No pertinent surgical history.  Family History  Problem Relation Age of Onset  . Migraines Daughter   . Allergies Daughter   . Allergies Son     Social History   Socioeconomic History  . Marital status: Divorced    Spouse name: Not on file  . Number of children: Not on file  . Years of education: Not on file  . Highest education level: Not on file  Occupational History  . Not on file  Social Needs  . Financial resource strain: Not on file  . Food insecurity:    Worry: Not on file    Inability: Not on file  . Transportation needs:    Medical: Not on file    Non-medical: Not on file  Tobacco Use  . Smoking status: Never Smoker  . Smokeless tobacco: Never Used  Substance and Sexual Activity  . Alcohol use: Yes    Comment: occasionally  . Drug use: No  . Sexual activity: Not Currently  Lifestyle  . Physical activity:    Days per week: Not on file    Minutes  per session: Not on file  . Stress: Not on file  Relationships  . Social connections:    Talks on phone: Not on file    Gets together: Not on file    Attends religious service: Not on file    Active member of club or organization: Not on file    Attends meetings of clubs or organizations: Not on file    Relationship status: Not on file  . Intimate partner violence:    Fear of current or ex partner: Not on file    Emotionally abused: Not on file    Physically abused: Not on file    Forced sexual activity: Not on file  Other Topics Concern  . Not on file  Social History Narrative  . Not on file    Outpatient Medications Prior to Visit  Medication Sig Dispense Refill  . amLODipine (NORVASC) 10 MG tablet 1 tablet by mouth ONCE daily 30 tablet 1  . aspirin EC 81 MG tablet Take 1 tablet (81 mg total) by mouth daily.    . mupirocin ointment (BACTROBAN) 2 % Place 1 application into the nose 2 (two) times daily. 22 g 1  . nebivolol (BYSTOLIC) 10 MG tablet Take 1 tablet (10 mg total) by mouth daily. 60 tablet 3  . neomycin-colistin-hydrocortisone-thonzonium (CORTISPORIN-TC) 3.10-16-08-0.5 MG/ML OTIC suspension Place  3 drops into the left ear 3 (three) times daily. 10 mL 0  . triamcinolone cream (KENALOG) 0.1 % Apply 1 application topically 2 (two) times daily. 80 g 1  . losartan (COZAAR) 50 MG tablet Take 1 tablet (50 mg total) by mouth daily. 90 tablet 3   No facility-administered medications prior to visit.     Allergies  Allergen Reactions  . Hctz [Hydrochlorothiazide] Rash  . Bactrim [Sulfamethoxazole-Trimethoprim] Rash  . Losartan Palpitations    Palpitations and fever    Review of Systems  Constitutional: Negative for fever and malaise/fatigue.  HENT: Negative for congestion.   Eyes: Positive for blurred vision.  Respiratory: Negative for shortness of breath.   Cardiovascular: Negative for chest pain, palpitations and leg swelling.  Gastrointestinal: Negative for abdominal  pain, blood in stool and nausea.  Genitourinary: Negative for dysuria and frequency.  Musculoskeletal: Negative for falls.  Skin: Negative for rash.  Neurological: Negative for dizziness, loss of consciousness and headaches.  Endo/Heme/Allergies: Negative for environmental allergies.  Psychiatric/Behavioral: Negative for depression. The patient is not nervous/anxious.        Objective:    Physical Exam  BP (!) 142/78   Pulse 80   Temp 98.1 F (36.7 C) (Oral)   Resp 18   Wt 142 lb 6.4 oz (64.6 kg)   SpO2 97%   BMI 26.05 kg/m  Wt Readings from Last 3 Encounters:  10/19/18 142 lb 6.4 oz (64.6 kg)  09/08/18 142 lb (64.4 kg)  06/06/18 137 lb (62.1 kg)     Lab Results  Component Value Date   WBC 5.1 03/04/2018   HGB 14.1 03/04/2018   HCT 43.5 03/04/2018   PLT 145 (L) 03/04/2018   GLUCOSE 105 (H) 03/04/2018   CHOL 167 10/18/2017   TRIG 114.0 10/18/2017   HDL 41.90 10/18/2017   LDLDIRECT 124.0 09/06/2016   LDLCALC 102 (H) 10/18/2017   ALT 13 10/18/2017   AST 17 10/18/2017   NA 139 03/04/2018   K 4.2 03/04/2018   CL 105 03/04/2018   CREATININE 1.24 03/04/2018   BUN 10 03/04/2018   CO2 25 03/04/2018   TSH 2.07 10/18/2017   PSA 5.70 (H) 10/12/2011   HGBA1C 6.1 10/18/2017    Lab Results  Component Value Date   TSH 2.07 10/18/2017   Lab Results  Component Value Date   WBC 5.1 03/04/2018   HGB 14.1 03/04/2018   HCT 43.5 03/04/2018   MCV 85.1 03/04/2018   PLT 145 (L) 03/04/2018   Lab Results  Component Value Date   NA 139 03/04/2018   K 4.2 03/04/2018   CO2 25 03/04/2018   GLUCOSE 105 (H) 03/04/2018   BUN 10 03/04/2018   CREATININE 1.24 03/04/2018   BILITOT 0.6 10/18/2017   ALKPHOS 49 10/18/2017   AST 17 10/18/2017   ALT 13 10/18/2017   PROT 7.5 10/18/2017   ALBUMIN 4.2 10/18/2017   CALCIUM 9.1 03/04/2018   ANIONGAP 9 03/04/2018   GFR 59.64 (L) 10/18/2017   Lab Results  Component Value Date   CHOL 167 10/18/2017   Lab Results  Component Value  Date   HDL 41.90 10/18/2017   Lab Results  Component Value Date   LDLCALC 102 (H) 10/18/2017   Lab Results  Component Value Date   TRIG 114.0 10/18/2017   Lab Results  Component Value Date   CHOLHDL 4 10/18/2017   Lab Results  Component Value Date   HGBA1C 6.1 10/18/2017  Assessment & Plan:   Problem List Items Addressed This Visit    Benign essential HTN    Improved on recheck, no changes to meds. Encouraged heart healthy diet such as the DASH diet and exercise as tolerated.       Relevant Orders   CBC   Comprehensive metabolic panel   TSH   CBC   Comprehensive metabolic panel   TSH   Hyperglycemia     minimize simple carbs. Increase exercise as tolerated.       Relevant Orders   Hemoglobin A1c   Hemoglobin A1c   Hyperlipidemia, mild - Primary   Relevant Orders   Lipid panel   Lipid panel   Cerumen impaction    Has been using H2O2 after the shower but still struggling with poor hearing  Wax is gone he will now go for a hearing.          I am having Mr. Alquan C. Marte maintain his aspirin EC, triamcinolone cream, mupirocin ointment, losartan, nebivolol, amLODipine, and neomycin-colistin-hydrocortisone-thonzonium.  No orders of the defined types were placed in this encounter.    [name], MD

## 2018-10-19 NOTE — Assessment & Plan Note (Signed)
Has been using H2O2 after the shower but still struggling with poor hearing  Wax is gone he will now go for a hearing.

## 2018-11-29 IMAGING — CR DG CHEST 2V
2 series · 2 of 2 positions shown · non-contrast
Comparison: None.

CLINICAL DATA: Chest pain and left arm weakness beginning today.

EXAM:
CHEST - 2 VIEW

[chest lat]
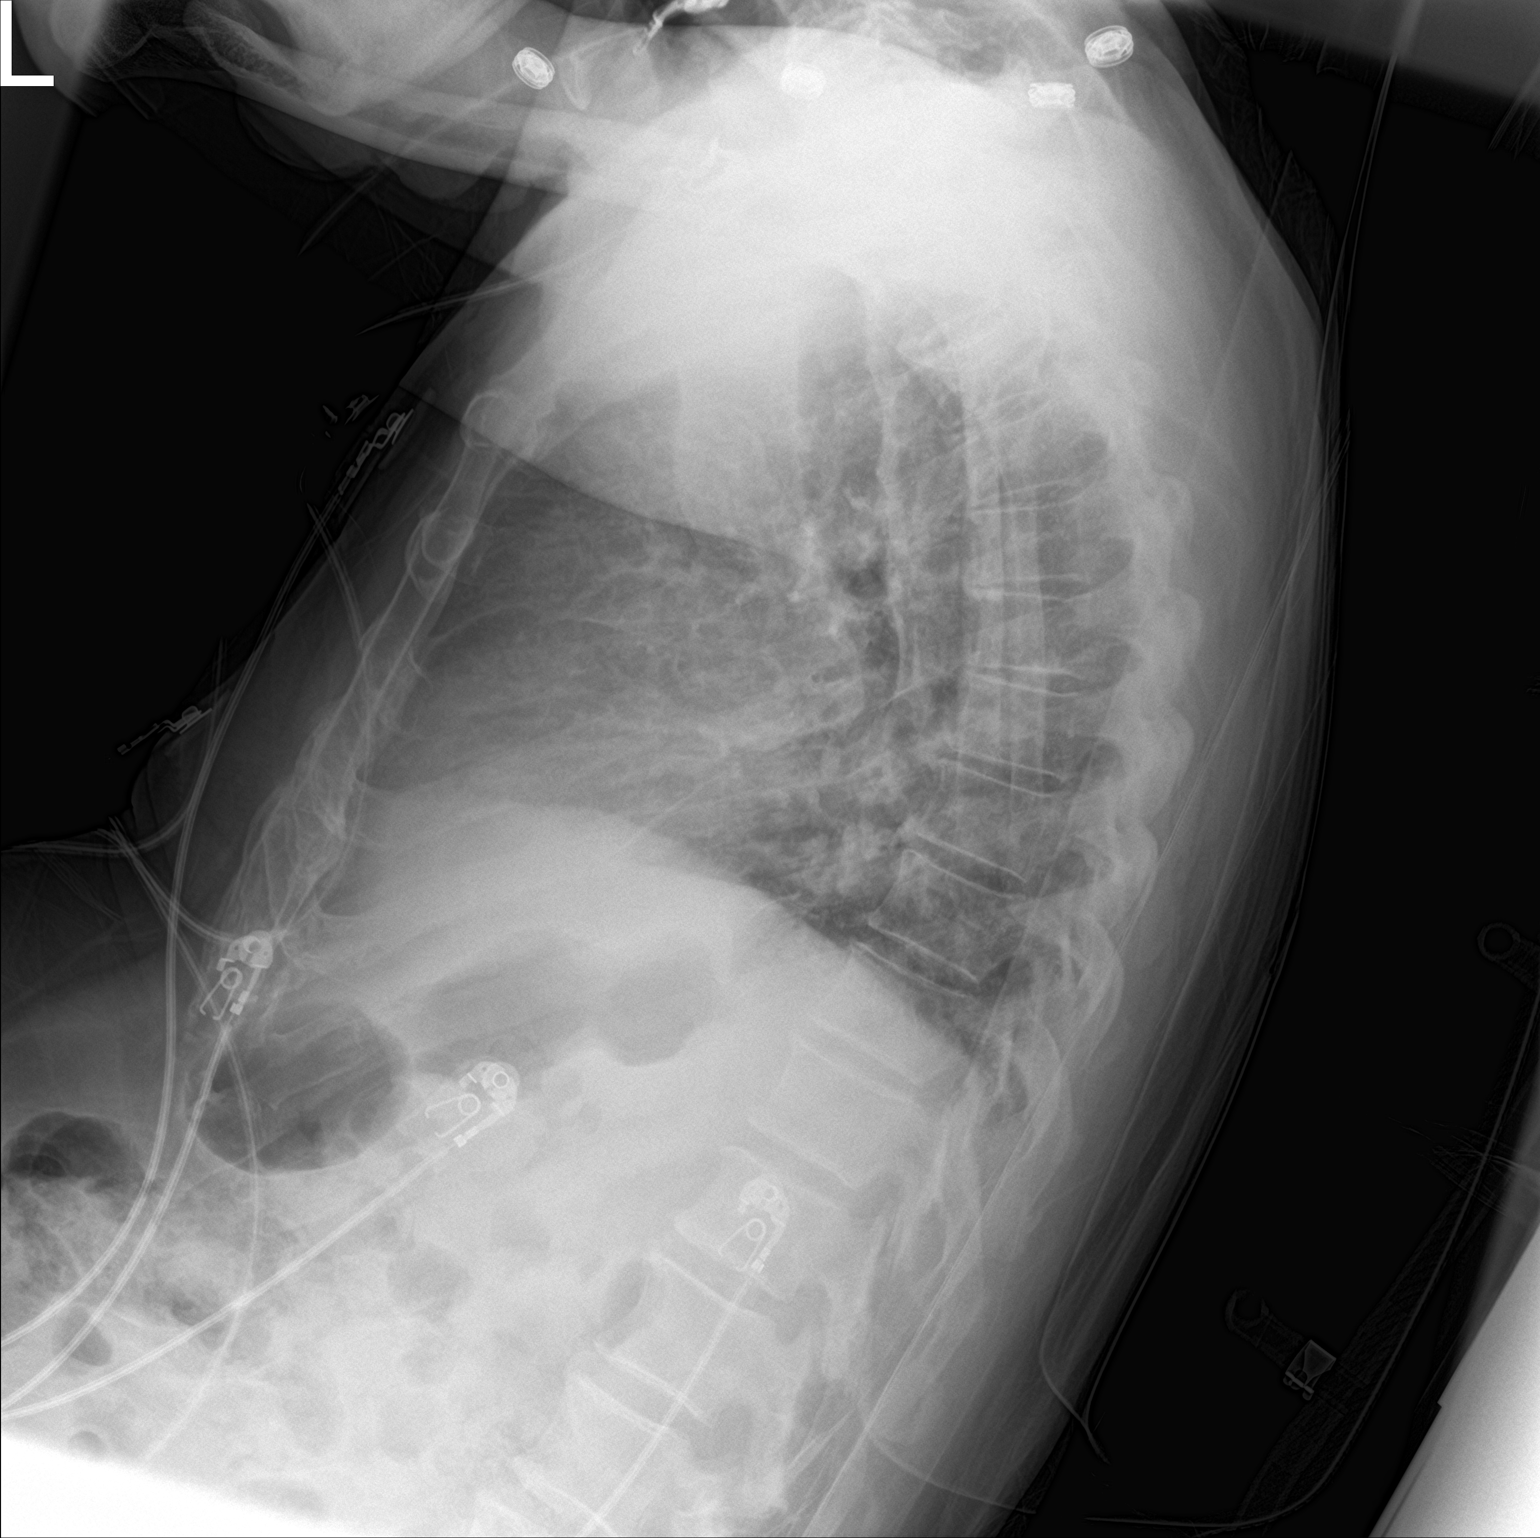

[chest ap]
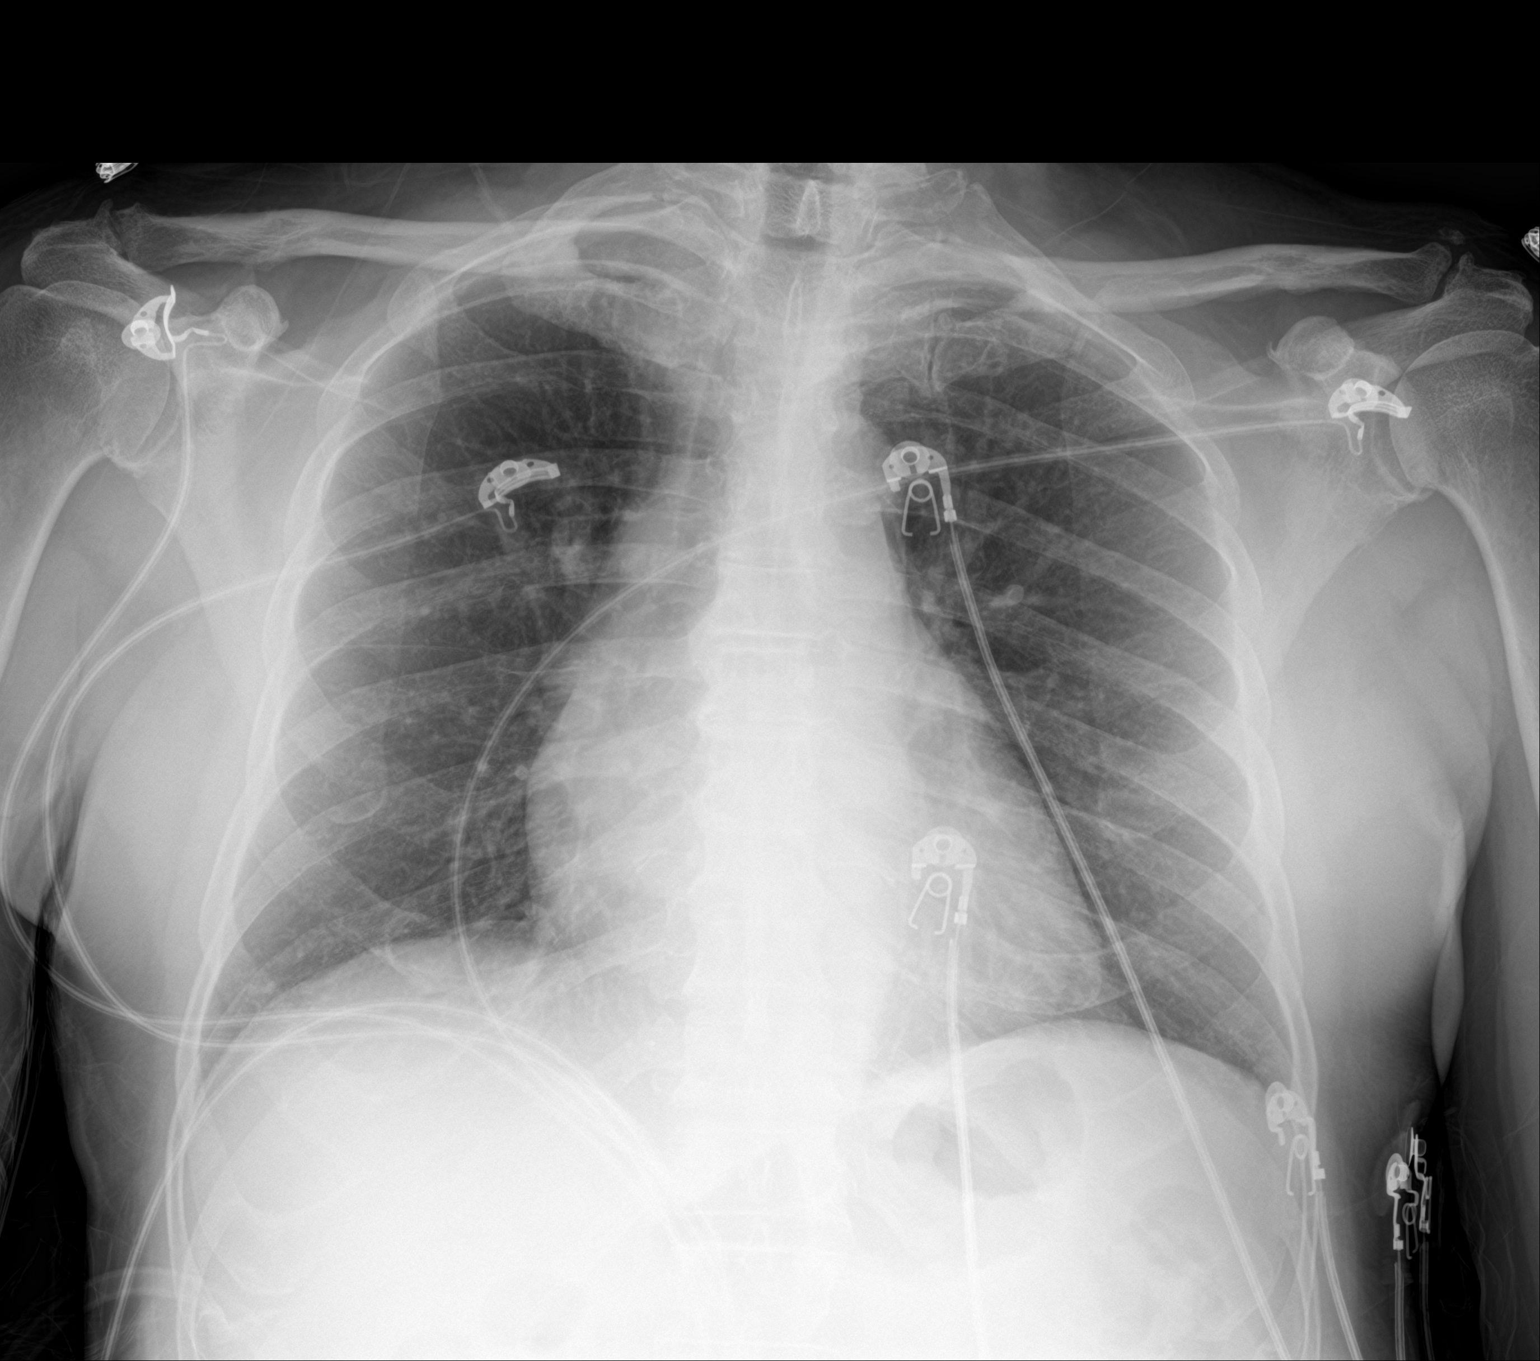

[2 of 2 positions shown; findings below may reference images not displayed]

FINDINGS: The heart size and mediastinal contours are within normal limits.
Both lungs are clear. The visualized skeletal structures are
unremarkable.
IMPRESSION: No active cardiopulmonary disease.

## 2019-01-12 ENCOUNTER — Telehealth: Payer: Self-pay | Admitting: Cardiovascular Disease

## 2019-01-12 NOTE — Telephone Encounter (Signed)
Called patient and daughter and both phones do not have a voicemail box set up on them.

## 2019-01-12 NOTE — Telephone Encounter (Signed)
MyChart msg. Sent.

## 2019-02-06 ENCOUNTER — Other Ambulatory Visit: Payer: Self-pay | Admitting: Family Medicine

## 2019-02-19 ENCOUNTER — Telehealth: Payer: Self-pay | Admitting: *Deleted

## 2019-02-19 NOTE — Telephone Encounter (Signed)
Virtual Visit Pre-Appointment Phone Call  "(Name), I am calling you today to discuss your upcoming appointment. We are currently trying to limit exposure to the virus that causes COVID-19 by seeing patients at home rather than in the office."  1. "What is the BEST phone number to call the day of the visit?" - include this in appointment notes  2. "Do you have or have access to (through a family member/friend) a smartphone with video capability that we can use for your visit?" a. If yes - list this number in appt notes as "cell" (if different from BEST phone #) and list the appointment type as a VIDEO visit in appointment notes b. If no - list the appointment type as a PHONE visit in appointment notes  Confirm consent - "In the setting of the current Covid19 crisis, you are scheduled for a (phone or video) visit with your provider on (date) at (time).  Just as we do with many in-office visits, in order for you to participate in this visit, we must obtain consent.  If you'd like, I can send this to your mychart (if signed up) or email for you to review.  Otherwise, I can obtain your verbal consent now.  All virtual visits are billed to your insurance company just like a normal visit would be.  By agreeing to a virtual visit, we'd like you to understand that the technology does not allow for your provider to perform an examination, and thus may limit your provider's ability to fully assess your condition. If your provider identifies any concerns that need to be evaluated in person, we will make arrangements to do so.  Finally, though the technology is pretty good, we cannot assure that it will always work on either your or our end, and in the setting of a video visit, we may have to convert it to a phone-only visit.  In either situation, we cannot ensure that we have a secure connection.  Are you willing to proceed?" YES 3. Advise patient to be prepared - "Two hours prior to your appointment, go ahead  and check your blood pressure, pulse, oxygen saturation, and your weight (if you have the equipment to check those) and write them all down. When your visit starts, your provider will ask you for this information. If you have an Apple Watch or Kardia device, please plan to have heart rate information ready on the day of your appointment. Please have a pen and paper handy nearby the day of the visit as well."  4. Give patient instructions for MyChart download to smartphone OR Doximity/Doxy.me as below if video visit (depending on what platform provider is using)  5. Inform patient they will receive a phone call 15 minutes prior to their appointment time (may be from unknown caller ID) so they should be prepared to answer    TELEPHONE CALL NOTE  Steven Morrison has been deemed a candidate for a follow-up tele-health visit to limit community exposure during the Covid-19 pandemic. I spoke with the patient via phone to ensure availability of phone/video source, confirm preferred email & phone number, and discuss instructions and expectations.  I reminded Steven Morrison to be prepared with any vital sign and/or heart rhythm information that could potentially be obtained via home monitoring, at the time of his visit. I reminded Steven Morrison to expect a phone call prior to his visit.  Ricci Barker, RN 02/19/2019 12:40 PM   INSTRUCTIONS FOR DOWNLOADING  THE MYCHART APP TO SMARTPHONE  - The patient must first make sure to have activated MyChart and know their login information - If Apple, go to [employer] and type in MyChart in the search bar and download the app. If Android, ask patient to go to [employer] and type in [facility] in the search bar and download the app. The app is free but as with any other app downloads, their phone may require them to verify saved payment information or Apple/Android password.  - The patient will need to then log into the app with their MyChart  username and password, and select [facility] as their healthcare provider to link the account. When it is time for your visit, go to the MyChart app, find appointments, and click Begin Video Visit. Be sure to Select Allow for your device to access the Microphone and Camera for your visit. You will then be connected, and your provider will be with you shortly.  **If they have any issues connecting, or need assistance please contact MyChart service desk [phone])**  **If using a computer, in order to ensure the best quality for their visit they will need to use either of the following Internet Browsers: [employer], or Google Chrome**  IF USING DOXIMITY or DOXY.ME - The patient will receive a link just prior to their visit by text.     FULL LENGTH CONSENT FOR TELE-HEALTH VISIT   I hereby voluntarily request, consent and authorize [facility] and its employed or contracted physicians, physician assistants, nurse practitioners or other licensed health care professionals (the Practitioner), to provide me with telemedicine health care services (the "Services") as deemed necessary by the treating Practitioner. I acknowledge and consent to receive the Services by the Practitioner via telemedicine. I understand that the telemedicine visit will involve communicating with the Practitioner through live audiovisual communication technology and the disclosure of certain medical information by electronic transmission. I acknowledge that I have been given the opportunity to request an in-person assessment or other available alternative prior to the telemedicine visit and am voluntarily participating in the telemedicine visit.  I understand that I have the right to withhold or withdraw my consent to the use of telemedicine in the course of my care at any time, without affecting my right to future care or treatment, and that the Practitioner or I may terminate the telemedicine visit at any  time. I understand that I have the right to inspect all information obtained and/or recorded in the course of the telemedicine visit and may receive copies of available information for a reasonable fee.  I understand that some of the potential risks of receiving the Services via telemedicine include:  Marland Kitchen Delay or interruption in medical evaluation due to technological equipment failure or disruption; . Information transmitted may not be sufficient (e.g. poor resolution of images) to allow for appropriate medical decision making by the Practitioner; and/or  . In rare instances, security protocols could fail, causing a breach of personal health information.  Furthermore, I acknowledge that it is my responsibility to provide information about my medical history, conditions and care that is complete and accurate to the best of my ability. I acknowledge that Practitioner's advice, recommendations, and/or decision may be based on factors not within their control, such as incomplete or inaccurate data provided by me or distortions of diagnostic images or specimens that may result from electronic transmissions. I understand that the practice of medicine is not an exact science and that Practitioner  makes no warranties or guarantees regarding treatment outcomes. I acknowledge that I will receive a copy of this consent concurrently upon execution via email to the email address I last provided but may also request a printed copy by calling the office of [facility].    I understand that my insurance will be billed for this visit.   I have read or had this consent read to me. . I understand the contents of this consent, which adequately explains the benefits and risks of the Services being provided via telemedicine.  . I have been provided ample opportunity to ask questions regarding this consent and the Services and have had my questions answered to my satisfaction. . I give my informed consent for the services  to be provided through the use of telemedicine in my medical care  By participating in this telemedicine visit I agree to the above.

## 2019-02-20 ENCOUNTER — Telehealth (INDEPENDENT_AMBULATORY_CARE_PROVIDER_SITE_OTHER): Payer: Medicare Other | Admitting: Cardiovascular Disease

## 2019-02-20 DIAGNOSIS — I10 Essential (primary) hypertension: Secondary | ICD-10-CM

## 2019-02-20 NOTE — Progress Notes (Signed)
Virtual Visit via Telephone Note   This visit type was conducted due to national recommendations for restrictions regarding the COVID-19 Pandemic (e.g. social distancing) in an effort to limit this patient's exposure and mitigate transmission in our community.  Due to his co-morbid illnesses, this patient is at least at moderate risk for complications without adequate follow up.  This format is felt to be most appropriate for this patient at this time.  The patient did not have access to video technology/had technical difficulties with video requiring transitioning to audio format only (telephone).  All issues noted in this document were discussed and addressed.  No physical exam could be performed with this format.  Please refer to the patient's chart for his  consent to telehealth for [facility].   Date:  02/20/2019   ID:  Steven Morrison, DOB [birth year], MRN [id number]  Patient Location: Home Provider Location: Office  PCP:  [name], MD  Cardiologist:  No primary care provider on file.  Electrophysiologist:  None   Evaluation Performed:  Follow-Up Visit  Chief Complaint: Doing well with no complaints  History of Present Illness:    Steven Morrison is a 85 y.o. male who was reached via phone for a follow-up visit regarding essential hypertension.   He has known history of refractory hypertension with poor adherence to medications.   A nuclear stress test in 12/2013 showed no evidence of ischemia with normal ejection fraction. Renal artery duplex in 2015 showed no evidence of renal artery stenosis. Multiple allergies and intolerance are listed in his list.  However, this does not seem to be accurate.    Hydrochlorothiazide caused a rash.  It is not entirely clear what kind of reaction he had with spironolactone but most likely it was something not related.  Losartan was added last year and amlodipine was increased during the most recent visit.  He has been doing  reasonably well and denies any chest pain, shortness of breath or palpitations.  No dizziness.  He does have a blood pressure machine at home but the person who checks his blood pressure was not available to do it.   The patient does not have symptoms concerning for COVID-19 infection (fever, chills, cough, or new shortness of breath).    Past Medical History:  Diagnosis Date  . Anxiety state, unspecified 12/19/2013  . Benign essential HTN 09/12/2011   Does not tolerate thiazide diuretics developed very bad dermatitis   . Constipation 02/10/2012  . Dermatitis 09/13/2012  . Elevated PSA 11/03/2011  . HTN (hypertension) 09/12/2011  . Hyperlipidemia, mild 04/17/2015  . Intertrigo 03/20/2012  . Medicare annual wellness visit, subsequent 09/12/2011  . Rash, skin 01/02/2012   Left lower leg  . Restless sleeper 01/04/2017  . Thrombocytopenia ([place]) 11/17/2011   slight  . Uncontrolled hypertension 09/12/2011  . Urinary hesitancy 10/12/2011  . Urinary urgency 10/12/2011   No past surgical history on file.   Current Meds  Medication Sig  . amLODipine (NORVASC) 10 MG tablet 1 tablet by mouth ONCE daily  . aspirin EC 81 MG tablet Take 1 tablet (81 mg total) by mouth daily.  Marland Kitchen losartan (COZAAR) 50 MG tablet Take 1 tablet (50 mg total) by mouth daily.  . nebivolol (BYSTOLIC) 10 MG tablet Take 1 tablet (10 mg total) by mouth daily.  Marland Kitchen neomycin-colistin-hydrocortisone-thonzonium (CORTISPORIN-TC) 3.10-16-08-0.5 MG/ML OTIC suspension Place 3 drops into the left ear 3 (three) times daily.  Marland Kitchen triamcinolone cream (KENALOG) 0.1 % APPLY EXTERNALLY TO  THE AFFECTED AREA TWICE DAILY     Allergies:   Hctz [hydrochlorothiazide], Bactrim [sulfamethoxazole-trimethoprim], and Losartan   Social History   Tobacco Use  . Smoking status: Never Smoker  . Smokeless tobacco: Never Used  Substance Use Topics  . Alcohol use: Yes    Comment: occasionally  . Drug use: No     Family Hx: The patient's family history includes  Allergies in his daughter and son; Migraines in his daughter.  ROS:   Please see the history of present illness.     All other systems reviewed and are negative.   Prior CV studies:   The following studies were reviewed today:    Labs/Other Tests and Data Reviewed:    EKG:  No ECG reviewed.  Recent Labs: 10/19/2018: ALT 19; BUN 12; Creatinine, Ser 1.23; Hemoglobin 13.8; Platelets 114.0; Potassium 4.4; Sodium 137; TSH 1.82   Recent Lipid Panel Lab Results  Component Value Date/Time   CHOL 153 10/19/2018 01:54 PM   TRIG 129.0 10/19/2018 01:54 PM   HDL 42.70 10/19/2018 01:54 PM   CHOLHDL 4 10/19/2018 01:54 PM   LDLCALC 84 10/19/2018 01:54 PM   LDLDIRECT 124.0 09/06/2016 02:03 PM    Wt Readings from Last 3 Encounters:  10/19/18 142 lb 6.4 oz (64.6 kg)  09/08/18 142 lb (64.4 kg)  06/06/18 137 lb (62.1 kg)     Objective:    Vital Signs:  There were no vitals taken for this visit.   VITAL SIGNS:  reviewed  ASSESSMENT & PLAN:    1.  Essential hypertension: The patient is not able to check his blood pressure today.  He is doing well on current medications including amlodipine, losartan and Bystolic.  Continue same medications and will have to bring him in 3 months for an office visit with blood pressure check.   COVID-19 Education: The signs and symptoms of COVID-19 were discussed with the patient and how to seek care for testing (follow up with PCP or arrange E-visit).  The importance of social distancing was discussed today.  Time:   Today, I have spent 6 minutes with the patient with telehealth technology discussing the above problems.     Medication Adjustments/Labs and Tests Ordered: Current medicines are reviewed at length with the patient today.  Concerns regarding medicines are outlined above.   Tests Ordered: No orders of the defined types were placed in this encounter.   Medication Changes: No orders of the defined types were placed in this encounter.    Follow Up:  In Person in 3 month(s)  Signed, Kathlyn Sacramento, MD  02/20/2019 11:29 AM    [place]

## 2019-02-20 NOTE — Patient Instructions (Addendum)
Medication Instructions:  The current medical regimen is effective;  continue present plan and medications.  If you need a refill on your cardiac medications before your next appointment, please call your pharmacy.   Follow-Up: At [facility], you and your health needs are our priority.  As part of our continuing mission to provide you with exceptional heart care, we have created designated Provider Care Teams.  These Care Teams include your primary Cardiologist (physician) and Advanced Practice Providers (APPs -  Physician Assistants and Nurse Practitioners) who all work together to provide you with the care you need, when you need it. You will need a follow up appointment in 3 months.  You may see Dr.Arida or one of the following Advanced Practice Providers on your designated Care Team:   Kerin Ransom, PA-C Roby Lofts, [place] . Sande Rives, PA-C

## 2019-02-21 ENCOUNTER — Telehealth: Payer: Self-pay | Admitting: *Deleted

## 2019-02-21 NOTE — Telephone Encounter (Signed)
Unable to contact patient to schedule 3 month follow up visit with Dr. Leland Her schedule the appointment and mail the information to the patient

## 2019-03-19 ENCOUNTER — Encounter: Payer: Self-pay | Admitting: Family Medicine

## 2019-03-19 MED ORDER — NEBIVOLOL HCL 10 MG PO TABS: 10.0000 mg | ORAL_TABLET | Freq: Every day | ORAL | 5 refills | Status: DC

## 2019-03-29 DIAGNOSIS — H5213 Myopia, bilateral: Secondary | ICD-10-CM | POA: Diagnosis not present

## 2019-03-29 DIAGNOSIS — H25813 Combined forms of age-related cataract, bilateral: Secondary | ICD-10-CM | POA: Diagnosis not present

## 2019-03-29 DIAGNOSIS — H52223 Regular astigmatism, bilateral: Secondary | ICD-10-CM | POA: Diagnosis not present

## 2019-03-29 DIAGNOSIS — H524 Presbyopia: Secondary | ICD-10-CM | POA: Diagnosis not present

## 2019-04-25 NOTE — Progress Notes (Signed)
Virtual Visit via Video Note  I connected with patient on 04/26/19 at  1:00 PM EDT by audio enabled telemedicine application and verified that I am speaking with the correct person using two identifiers.   THIS ENCOUNTER IS A VIRTUAL VISIT DUE TO COVID-19 - PATIENT WAS NOT SEEN IN THE OFFICE. PATIENT HAS CONSENTED TO VIRTUAL VISIT / TELEMEDICINE VISIT   Location of patient: home  Location of provider: office  I discussed the limitations of evaluation and management by telemedicine and the availability of in person appointments. The patient expressed understanding and agreed to proceed.   Subjective:   Steven Morrison is a 85 y.o. male who presents for Medicare Annual/Subsequent preventive examination.  Review of Systems: Cardiac Risk Factors include: dyslipidemia;advanced age (>43men, >51 women);hypertension;male gender  Home Safety/Smoke Alarms: Feels safe in home. Smoke alarms in place.  Lives with daughter. 1 story home.   Male:       PSA-  Lab Results  Component Value Date   PSA 5.70 (H) 10/12/2011       Objective:    Vitals: Unable to assess. This visit is enabled though telemedicine due to Covid 19.   Advanced Directives 04/26/2019 04/20/2018 03/04/2018 10/28/2016  Does Patient Have a Medical Advance Directive? No No No No  Would patient like information on creating a medical advance directive? No - Patient declined Yes (MAU/Ambulatory/Procedural Areas - Information given) No - Patient declined Yes (MAU/Ambulatory/Procedural Areas - Information given)    Tobacco Social History   Tobacco Use  Smoking Status Never Smoker  Smokeless Tobacco Never Used     Counseling given: Not Answered   Clinical Intake:  Pain : No/denies pain   Past Medical History:  Diagnosis Date  . Anxiety state, unspecified 12/19/2013  . Benign essential HTN 09/12/2011   Does not tolerate thiazide diuretics developed very bad dermatitis   . Constipation 02/10/2012  . Dermatitis  09/13/2012  . Elevated PSA 11/03/2011  . HTN (hypertension) 09/12/2011  . Hyperlipidemia, mild 04/17/2015  . Intertrigo 03/20/2012  . Medicare annual wellness visit, subsequent 09/12/2011  . Rash, skin 01/02/2012   Left lower leg  . Restless sleeper 01/04/2017  . Thrombocytopenia ([place]) 11/17/2011   slight  . Uncontrolled hypertension 09/12/2011  . Urinary hesitancy 10/12/2011  . Urinary urgency 10/12/2011   History reviewed. No pertinent surgical history. Family History  Problem Relation Age of Onset  . Migraines Daughter   . Allergies Daughter   . Allergies Son    Social History   Socioeconomic History  . Marital status: Divorced    Spouse name: Not on file  . Number of children: Not on file  . Years of education: Not on file  . Highest education level: Not on file  Occupational History  . Not on file  Social Needs  . Financial resource strain: Not on file  . Food insecurity    Worry: Not on file    Inability: Not on file  . Transportation needs    Medical: Not on file    Non-medical: Not on file  Tobacco Use  . Smoking status: Never Smoker  . Smokeless tobacco: Never Used  Substance and Sexual Activity  . Alcohol use: Yes    Comment: occasionally  . Drug use: No  . Sexual activity: Not Currently  Lifestyle  . Physical activity    Days per week: Not on file    Minutes per session: Not on file  . Stress: Not on file  Relationships  .  Social Herbalist on phone: Not on file    Gets together: Not on file    Attends religious service: Not on file    Active member of club or organization: Not on file    Attends meetings of clubs or organizations: Not on file    Relationship status: Not on file  Other Topics Concern  . Not on file  Social History Narrative  . Not on file    Outpatient Encounter Medications as of 04/26/2019  Medication Sig  . amLODipine (NORVASC) 10 MG tablet 1 tablet by mouth ONCE daily  . aspirin EC 81 MG tablet Take 1 tablet (81 mg total)  by mouth daily.  Marland Kitchen losartan (COZAAR) 50 MG tablet Take 1 tablet (50 mg total) by mouth daily.  . nebivolol (BYSTOLIC) 10 MG tablet Take 1 tablet (10 mg total) by mouth daily.  Marland Kitchen neomycin-colistin-hydrocortisone-thonzonium (CORTISPORIN-TC) 3.10-16-08-0.5 MG/ML OTIC suspension Place 3 drops into the left ear 3 (three) times daily.  Marland Kitchen triamcinolone cream (KENALOG) 0.1 % APPLY EXTERNALLY TO THE AFFECTED AREA TWICE DAILY   No facility-administered encounter medications on file as of 04/26/2019.     Activities of Daily Living In your present state of health, do you have any difficulty performing the following activities: 04/26/2019  Hearing? N  Vision? N  Difficulty concentrating or making decisions? N  Walking or climbing stairs? N  Dressing or bathing? N  Doing errands, shopping? N  Preparing Food and eating ? N  Using the Toilet? N  In the past six months, have you accidently leaked urine? N  Do you have problems with loss of bowel control? N  Managing your Medications? N  Managing your Finances? N  Housekeeping or managing your Housekeeping? N  Some recent data might be hidden    Patient Care Team: [name], MD as PCP - General (Family Medicine)   Assessment:   This is a routine wellness examination for Steven Morrison. Physical assessment deferred to PCP.  Exercise Activities and Dietary recommendations Current Exercise Habits: The patient does not participate in regular exercise at present(stays active in yard), Exercise limited by: None identified   Diet (meal preparation, eat out, water intake, caffeinated beverages, dairy products, fruits and vegetables): in general, a "healthy" diet  , well balanced     Goals    . DIET - INCREASE WATER INTAKE    . Patient Stated     Maintain current health       Fall Risk Fall Risk  04/26/2019 04/20/2018 03/17/2018 10/28/2016 04/29/2016  Falls in the past year? 0 No No No No  Comment - - Emmi Telephone Survey: data to providers prior to  load - -     Depression Screen PHQ 2/9 Scores 04/26/2019 04/20/2018 10/28/2016 04/29/2016  PHQ - 2 Score 0 0 0 0    Cognitive Function Ad8 score reviewed for issues:  Issues making decisions:no  Less interest in hobbies / activities:no  Repeats questions, stories (family complaining):no  Trouble using ordinary gadgets (microwave, computer, phone):no  Forgets the month or year: no  Mismanaging finances: no  Remembering appts:no  Daily problems with thinking and/or memory:no Ad8 score is=0     MMSE - Mini Mental State Exam 04/20/2018 10/28/2016  Not completed: Refused -  Orientation to time - 5  Orientation to Place - 5  Registration - 3  Attention/ Calculation - 3  Recall - 3  Language- name 2 objects - 2  Language- repeat - 1  Language- follow 3 step command - 3  Language- read & follow direction - 1  Write a sentence - 1  Copy design - 1  Total score - 28        Immunization History  Administered Date(s) Administered  . Tdap 09/10/2011     Screening Tests Health Maintenance  Topic Date Due  . PNA vac Low Risk Adult (1 of 2 - PCV13) 03/13/1995  . TETANUS/TDAP  09/09/2021  . INFLUENZA VACCINE  Discontinued       Plan:   See you next year! Keep up the great work!  I have personally reviewed and noted the following in the patient's chart:   . Medical and social history . Use of alcohol, tobacco or illicit drugs  . Current medications and supplements . Functional ability and status . Nutritional status . Physical activity . Advanced directives . List of other physicians . Hospitalizations, surgeries, and ER visits in previous 12 months . Vitals . Screenings to include cognitive, depression, and falls . Referrals and appointments  In addition, I have reviewed and discussed with patient certain preventive protocols, quality metrics, and best practice recommendations. A written personalized care plan for preventive services as well as general  preventive health recommendations were provided to patient.     Shela Nevin, [place]  04/26/2019

## 2019-04-26 ENCOUNTER — Encounter: Payer: Self-pay | Admitting: *Deleted

## 2019-04-26 ENCOUNTER — Ambulatory Visit (INDEPENDENT_AMBULATORY_CARE_PROVIDER_SITE_OTHER): Payer: Medicare Other | Admitting: *Deleted

## 2019-04-26 DIAGNOSIS — Z0000 Encounter for general adult medical examination without abnormal findings: Secondary | ICD-10-CM | POA: Diagnosis not present

## 2019-04-26 NOTE — Patient Instructions (Signed)
See you next year! Keep up the great work!  Health Maintenance After Age 83 After age 66, you are at a higher risk for certain long-term diseases and infections as well as injuries from falls. Falls are a major cause of broken bones and head injuries in people who are older than age 54. Getting regular preventive care can help to keep you healthy and well. Preventive care includes getting regular testing and making lifestyle changes as recommended by your health care provider. Talk with your health care provider about:  Which screenings and tests you should have. A screening is a test that checks for a disease when you have no symptoms.  A diet and exercise plan that is right for you. What should I know about screenings and tests to prevent falls? Screening and testing are the best ways to find a health problem early. Early diagnosis and treatment give you the best chance of managing medical conditions that are common after age 39. Certain conditions and lifestyle choices may make you more likely to have a fall. Your health care provider may recommend:  Regular vision checks. Poor vision and conditions such as cataracts can make you more likely to have a fall. If you wear glasses, make sure to get your prescription updated if your vision changes.  Medicine review. Work with your health care provider to regularly review all of the medicines you are taking, including over-the-counter medicines. Ask your health care provider about any side effects that may make you more likely to have a fall. Tell your health care provider if any medicines that you take make you feel dizzy or sleepy.  Osteoporosis screening. Osteoporosis is a condition that causes the bones to get weaker. This can make the bones weak and cause them to break more easily.  Blood pressure screening. Blood pressure changes and medicines to control blood pressure can make you feel dizzy.  Strength and balance checks. Your health care  provider may recommend certain tests to check your strength and balance while standing, walking, or changing positions.  Foot health exam. Foot pain and numbness, as well as not wearing proper footwear, can make you more likely to have a fall.  Depression screening. You may be more likely to have a fall if you have a fear of falling, feel emotionally low, or feel unable to do activities that you used to do.  Alcohol use screening. Using too much alcohol can affect your balance and may make you more likely to have a fall. What actions can I take to lower my risk of falls? General instructions  Talk with your health care provider about your risks for falling. Tell your health care provider if: ? You fall. Be sure to tell your health care provider about all falls, even ones that seem minor. ? You feel dizzy, sleepy, or off-balance.  Take over-the-counter and prescription medicines only as told by your health care provider. These include any supplements.  Eat a healthy diet and maintain a healthy weight. A healthy diet includes low-fat dairy products, low-fat (lean) meats, and fiber from whole grains, beans, and lots of fruits and vegetables. Home safety  Remove any tripping hazards, such as rugs, cords, and clutter.  Install safety equipment such as grab bars in bathrooms and safety rails on stairs.  Keep rooms and walkways well-lit. Activity   Follow a regular exercise program to stay fit. This will help you maintain your balance. Ask your health care provider what types of exercise are  appropriate for you.  If you need a cane or walker, use it as recommended by your health care provider.  Wear supportive shoes that have nonskid soles. Lifestyle  Do not drink alcohol if your health care provider tells you not to drink.  If you drink alcohol, limit how much you have: ? 0-1 drink a day for women. ? 0-2 drinks a day for men.  Be aware of how much alcohol is in your drink. In the  [place] one drink equals one typical bottle of beer (12 oz), one-half glass of wine (5 oz), or one shot of hard liquor (1 oz).  Do not use any products that contain nicotine or tobacco, such as cigarettes and e-cigarettes. If you need help quitting, ask your health care provider. Summary  Having a healthy lifestyle and getting preventive care can help to protect your health and wellness after age 9.  Screening and testing are the best way to find a health problem early and help you avoid having a fall. Early diagnosis and treatment give you the best chance for managing medical conditions that are more common for people who are older than age 71.  Falls are a major cause of broken bones and head injuries in people who are older than age 68. Take precautions to prevent a fall at home.  Work with your health care provider to learn what changes you can make to improve your health and wellness and to prevent falls. This information is not intended to replace advice given to you by your health care provider. Make sure you discuss any questions you have with your health care provider. Document Released: 06/15/2017 Document Revised: 11/23/2018 Document Reviewed: 06/15/2017 Elsevier Patient Education  2020 [employer].   Steven Morrison , Thank you for taking time to come for your Medicare Wellness Visit. I appreciate your ongoing commitment to your health goals. Please review the following plan we discussed and let me know if I can assist you in the future.   These are the goals we discussed: Goals    . DIET - INCREASE WATER INTAKE    . Patient Stated     Maintain current health       This is a list of the screening recommended for you and due dates:  Health Maintenance  Topic Date Due  . Pneumonia vaccines (1 of 2 - PCV13) 03/13/1995  . Tetanus Vaccine  09/09/2021  . Flu Shot  Discontinued

## 2019-04-30 ENCOUNTER — Other Ambulatory Visit: Payer: Self-pay

## 2019-04-30 ENCOUNTER — Ambulatory Visit (INDEPENDENT_AMBULATORY_CARE_PROVIDER_SITE_OTHER): Payer: Medicare Other | Admitting: Family Medicine

## 2019-04-30 VITALS — BP 167/99 | Wt 138.0 lb

## 2019-04-30 DIAGNOSIS — R739 Hyperglycemia, unspecified: Secondary | ICD-10-CM

## 2019-04-30 DIAGNOSIS — E785 Hyperlipidemia, unspecified: Secondary | ICD-10-CM | POA: Diagnosis not present

## 2019-04-30 DIAGNOSIS — I10 Essential (primary) hypertension: Secondary | ICD-10-CM

## 2019-05-03 NOTE — Assessment & Plan Note (Signed)
Encouraged heart healthy diet, increase exercise, avoid trans fats, consider a krill oil cap daily 

## 2019-05-03 NOTE — Assessment & Plan Note (Signed)
hgba1c acceptable, minimize simple carbs. Increase exercise as tolerated.  

## 2019-05-03 NOTE — Assessment & Plan Note (Signed)
Well controlled, no changes to meds. Encouraged heart healthy diet such as the DASH diet and exercise as tolerated.  °

## 2019-05-03 NOTE — Progress Notes (Signed)
Virtual Visit via Video Note  I connected with Steven Morrison on 05/03/19 at  3:00 PM EDT by a video enabled telemedicine application and verified that I am speaking with the correct person using two identifiers.  Location: Patient: home Provider: office   I discussed the limitations of evaluation and management by telemedicine and the availability of in person appointments. The patient expressed understanding and agreed to proceed. Princess Eulas Post, cma was able to get patient setup on visit phone after being unable to utilize video visit    Subjective:    Patient ID: Steven Morrison, male    DOB: 13-Jul-1928, 85 y.o.   MRN: [id number]  No chief complaint on file.   HPI Patient is in today for follow up on chronic medical concerns including hypertension, hyperlipidemia,hyperglycemia and more. No recent febrile illness or hospitalizations. Is accompanied by his daughter. Well controlled, no changes to meds. Encouraged heart healthy diet such as the DASH diet and exercise as tolerated.   Past Medical History:  Diagnosis Date  . Anxiety state, unspecified 12/19/2013  . Benign essential HTN 09/12/2011   Does not tolerate thiazide diuretics developed very bad dermatitis   . Constipation 02/10/2012  . Dermatitis 09/13/2012  . Elevated PSA 11/03/2011  . HTN (hypertension) 09/12/2011  . Hyperlipidemia, mild 04/17/2015  . Intertrigo 03/20/2012  . Medicare annual wellness visit, subsequent 09/12/2011  . Rash, skin 01/02/2012   Left lower leg  . Restless sleeper 01/04/2017  . Thrombocytopenia ([place]) 11/17/2011   slight  . Uncontrolled hypertension 09/12/2011  . Urinary hesitancy 10/12/2011  . Urinary urgency 10/12/2011    No past surgical history on file.  Family History  Problem Relation Age of Onset  . Migraines Daughter   . Allergies Daughter   . Allergies Son     Social History   Socioeconomic History  . Marital status: Divorced    Spouse name: Not on file  . Number of  children: Not on file  . Years of education: Not on file  . Highest education level: Not on file  Occupational History  . Not on file  Social Needs  . Financial resource strain: Not on file  . Food insecurity    Worry: Not on file    Inability: Not on file  . Transportation needs    Medical: Not on file    Non-medical: Not on file  Tobacco Use  . Smoking status: Never Smoker  . Smokeless tobacco: Never Used  Substance and Sexual Activity  . Alcohol use: Yes    Comment: occasionally  . Drug use: No  . Sexual activity: Not Currently  Lifestyle  . Physical activity    Days per week: Not on file    Minutes per session: Not on file  . Stress: Not on file  Relationships  . Social Herbalist on phone: Not on file    Gets together: Not on file    Attends religious service: Not on file    Active member of club or organization: Not on file    Attends meetings of clubs or organizations: Not on file    Relationship status: Not on file  . Intimate partner violence    Fear of current or ex partner: Not on file    Emotionally abused: Not on file    Physically abused: Not on file    Forced sexual activity: Not on file  Other Topics Concern  . Not on file  Social History Narrative  .  Not on file    Outpatient Medications Prior to Visit  Medication Sig Dispense Refill  . aspirin EC 81 MG tablet Take 1 tablet (81 mg total) by mouth daily.    Marland Kitchen losartan (COZAAR) 50 MG tablet Take 1 tablet (50 mg total) by mouth daily. 90 tablet 3  . nebivolol (BYSTOLIC) 10 MG tablet Take 1 tablet (10 mg total) by mouth daily. 60 tablet 5  . neomycin-colistin-hydrocortisone-thonzonium (CORTISPORIN-TC) 3.10-16-08-0.5 MG/ML OTIC suspension Place 3 drops into the left ear 3 (three) times daily. 10 mL 0  . triamcinolone cream (KENALOG) 0.1 % APPLY EXTERNALLY TO THE AFFECTED AREA TWICE DAILY 80 g 1  . amLODipine (NORVASC) 10 MG tablet 1 tablet by mouth ONCE daily 30 tablet 1   No  facility-administered medications prior to visit.     Allergies  Allergen Reactions  . Hctz [Hydrochlorothiazide] Rash  . Bactrim [Sulfamethoxazole-Trimethoprim] Rash  . Losartan Palpitations    Palpitations and fever    Review of Systems  Constitutional: Negative for fever and malaise/fatigue.  HENT: Negative for congestion.   Eyes: Negative for blurred vision.  Respiratory: Negative for shortness of breath.   Cardiovascular: Negative for chest pain, palpitations and leg swelling.  Gastrointestinal: Negative for abdominal pain, blood in stool and nausea.  Genitourinary: Negative for dysuria and frequency.  Musculoskeletal: Negative for falls.  Skin: Negative for rash.  Neurological: Negative for dizziness, loss of consciousness and headaches.  Endo/Heme/Allergies: Negative for environmental allergies.  Psychiatric/Behavioral: Negative for depression. The patient is not nervous/anxious.        Objective:    Physical Exam unable to obtain via phone visit  BP (!) 167/99 (BP Location: Left Arm, Patient Position: Sitting, Cuff Size: Normal)   Wt 138 lb (62.6 kg)   BMI 25.24 kg/m  Wt Readings from Last 3 Encounters:  04/30/19 138 lb (62.6 kg)  10/19/18 142 lb 6.4 oz (64.6 kg)  09/08/18 142 lb (64.4 kg)    Diabetic Foot Exam - Simple   No data filed     Lab Results  Component Value Date   WBC 6.0 10/19/2018   HGB 13.8 10/19/2018   HCT 42.6 10/19/2018   PLT 114.0 (L) 10/19/2018   GLUCOSE 78 10/19/2018   CHOL 153 10/19/2018   TRIG 129.0 10/19/2018   HDL 42.70 10/19/2018   LDLDIRECT 124.0 09/06/2016   LDLCALC 84 10/19/2018   ALT 19 10/19/2018   AST 21 10/19/2018   NA 137 10/19/2018   K 4.4 10/19/2018   CL 101 10/19/2018   CREATININE 1.23 10/19/2018   BUN 12 10/19/2018   CO2 24 10/19/2018   TSH 1.82 10/19/2018   PSA 5.70 (H) 10/12/2011   HGBA1C 6.0 10/19/2018    Lab Results  Component Value Date   TSH 1.82 10/19/2018   Lab Results  Component Value Date    WBC 6.0 10/19/2018   HGB 13.8 10/19/2018   HCT 42.6 10/19/2018   MCV 85.0 10/19/2018   PLT 114.0 (L) 10/19/2018   Lab Results  Component Value Date   NA 137 10/19/2018   K 4.4 10/19/2018   CO2 24 10/19/2018   GLUCOSE 78 10/19/2018   BUN 12 10/19/2018   CREATININE 1.23 10/19/2018   BILITOT 0.4 10/19/2018   ALKPHOS 56 10/19/2018   AST 21 10/19/2018   ALT 19 10/19/2018   PROT 7.3 10/19/2018   ALBUMIN 4.3 10/19/2018   CALCIUM 8.9 10/19/2018   ANIONGAP 9 03/04/2018   GFR 55.46 (L) 10/19/2018   Lab  Results  Component Value Date   CHOL 153 10/19/2018   Lab Results  Component Value Date   HDL 42.70 10/19/2018   Lab Results  Component Value Date   LDLCALC 84 10/19/2018   Lab Results  Component Value Date   TRIG 129.0 10/19/2018   Lab Results  Component Value Date   CHOLHDL 4 10/19/2018   Lab Results  Component Value Date   HGBA1C 6.0 10/19/2018       Assessment & Plan:   Problem List Items Addressed This Visit    Benign essential HTN    Well controlled, no changes to meds. Encouraged heart healthy diet such as the DASH diet and exercise as tolerated.       Relevant Orders   CBC   Comprehensive metabolic panel   TSH   Hyperglycemia - Primary    hgba1c acceptable, minimize simple carbs. Increase exercise as tolerated.      Relevant Orders   Hemoglobin A1c   Hyperlipidemia, mild    Encouraged heart healthy diet, increase exercise, avoid trans fats, consider a krill oil cap daily      Relevant Orders   Lipid panel      I have discontinued Mr. Bertrand C. Pires's amLODipine. I am also having him maintain his aspirin EC, losartan, neomycin-colistin-hydrocortisone-thonzonium, triamcinolone cream, and nebivolol.  No orders of the defined types were placed in this encounter.   I discussed the assessment and treatment plan with the patient. The patient was provided an opportunity to ask questions and all were answered. The patient agreed with the  plan and demonstrated an understanding of the instructions.   The patient was advised to call back or seek an in-person evaluation if the symptoms worsen or if the condition fails to improve as anticipated.  I provided 25 minutes of non-face-to-face time during this encounter.   [name], MD

## 2019-05-29 ENCOUNTER — Other Ambulatory Visit: Payer: Self-pay

## 2019-05-29 ENCOUNTER — Encounter: Payer: Self-pay | Admitting: Cardiovascular Disease

## 2019-05-29 ENCOUNTER — Ambulatory Visit (INDEPENDENT_AMBULATORY_CARE_PROVIDER_SITE_OTHER): Payer: Medicare Other | Admitting: Cardiovascular Disease

## 2019-05-29 VITALS — BP 227/90 | HR 67 | Temp 96.8°F | Ht 62.0 in | Wt 143.0 lb

## 2019-05-29 DIAGNOSIS — I10 Essential (primary) hypertension: Secondary | ICD-10-CM

## 2019-05-29 NOTE — Patient Instructions (Signed)
Medication Instructions:  Your physician recommends that you continue on your current medications as directed. Please refer to the Current Medication list given to you today.  If you need a refill on your cardiac medications before your next appointment, please call your pharmacy.   Lab work: None ordered If you have labs (blood work) drawn today and your tests are completely normal, you will receive your results only by: [place] (if you have MyChart) OR A paper copy in the mail If you have any lab test that is abnormal or we need to change your treatment, we will call you to review the results.  Testing/Procedures: None ordered  Follow-Up: Follow up as needed with Dr. Fletcher Anon

## 2019-05-29 NOTE — Progress Notes (Signed)
Cardiology Office Note   Date:  05/29/2019   ID:  Devlyn, Placek 01/13/1928, MRN [id number]  PCP:  Steven Lukes, MD  Cardiologist:   Kathlyn Sacramento, MD   No chief complaint on file.     History of Present Illness: Steven Morrison is a 85 y.o. male who presents for a follow-up visit regarding essential hypertension.   He has known history of refractory hypertension with poor adherence to medications.   A nuclear stress test in 12/2013 showed no evidence of ischemia with normal ejection fraction. Renal artery duplex in 2015 showed no evidence of renal artery stenosis. Multiple allergies and intolerance are listed in his list.   He has been doing well with no recent chest pain or shortness of breath in spite of very elevated blood pressure. Amlodipine was discontinued by his primary care physician.  He takes Bystolic and losartan.  He did not take his meds yet.    Past Medical History:  Diagnosis Date  . Anxiety state, unspecified 12/19/2013  . Benign essential HTN 09/12/2011   Does not tolerate thiazide diuretics developed very bad dermatitis   . Constipation 02/10/2012  . Dermatitis 09/13/2012  . Elevated PSA 11/03/2011  . HTN (hypertension) 09/12/2011  . Hyperlipidemia, mild 04/17/2015  . Intertrigo 03/20/2012  . Medicare annual wellness visit, subsequent 09/12/2011  . Rash, skin 01/02/2012   Left lower leg  . Restless sleeper 01/04/2017  . Thrombocytopenia ([place]) 11/17/2011   slight  . Uncontrolled hypertension 09/12/2011  . Urinary hesitancy 10/12/2011  . Urinary urgency 10/12/2011    No past surgical history on file.   Current Outpatient Medications  Medication Sig Dispense Refill  . aspirin EC 81 MG tablet Take 1 tablet (81 mg total) by mouth daily.    Marland Kitchen losartan (COZAAR) 50 MG tablet Take 1 tablet (50 mg total) by mouth daily. 90 tablet 3  . nebivolol (BYSTOLIC) 10 MG tablet Take 1 tablet (10 mg total) by mouth daily. 60 tablet 5  .  neomycin-colistin-hydrocortisone-thonzonium (CORTISPORIN-TC) 3.10-16-08-0.5 MG/ML OTIC suspension Place 3 drops into the left ear 3 (three) times daily. 10 mL 0  . triamcinolone cream (KENALOG) 0.1 % APPLY EXTERNALLY TO THE AFFECTED AREA TWICE DAILY 80 g 1   No current facility-administered medications for this visit.     Allergies:   Hctz [hydrochlorothiazide], Bactrim [sulfamethoxazole-trimethoprim], and Losartan    Social History:  The patient  reports that he has never smoked. He has never used smokeless tobacco. He reports current alcohol use. He reports that he does not use drugs.   Family History:  The patient's family history includes Allergies in his daughter and son; Migraines in his daughter.    ROS:  Please see the history of present illness.   Otherwise, review of systems are positive for none.   All other systems are reviewed and negative.    PHYSICAL EXAM: VS:  BP (!) 227/90   Pulse 67   Temp (!) 96.8 F (36 C)   Ht 5\' 2"  (1.575 m)   Wt 143 lb (64.9 kg)   SpO2 100%   BMI 26.16 kg/m  , BMI Body mass index is 26.16 kg/m. GEN: Well nourished, well developed, in no acute distress  HEENT: normal  Neck: no JVD, carotid bruits, or masses Cardiac: RRR; no murmurs, rubs, or gallops,no edema  Respiratory:  clear to auscultation bilaterally, normal work of breathing GI: soft, nontender, nondistended, + BS MS: no deformity or atrophy  Skin: warm  and dry, no rash Neuro:  Strength and sensation are intact Psych: euthymic mood, full affect   EKG:  EKG  ordered today. EKG showed normal sinus rhythm with first-degree AV block and nonspecific T wave changes.  Recent Labs: 10/19/2018: ALT 19; BUN 12; Creatinine, Ser 1.23; Hemoglobin 13.8; Platelets 114.0; Potassium 4.4; Sodium 137; TSH 1.82    Lipid Panel    Component Value Date/Time   CHOL 153 10/19/2018 1354   TRIG 129.0 10/19/2018 1354   HDL 42.70 10/19/2018 1354   CHOLHDL 4 10/19/2018 1354   VLDL 25.8 10/19/2018 1354    LDLCALC 84 10/19/2018 1354   LDLDIRECT 124.0 09/06/2016 1403      Wt Readings from Last 3 Encounters:  05/29/19 143 lb (64.9 kg)  04/30/19 138 lb (62.6 kg)  10/19/18 142 lb 6.4 oz (64.6 kg)      No flowsheet data found.    ASSESSMENT AND PLAN:  1.  Essential hypertension: Blood pressure is extremely elevated today.  Amlodipine was discontinued last month.  He is currently on Bystolic and losartan.  I suggested increasing the dose of losartan to 100 mg daily.  He does not want to make any changes at the present time.    Disposition:   Managing his hypertension has been difficult due to his tendency to resist changes and adding medications.  I advised him to continue follow-up with his primary care physician.  Follow-up with me as needed.  Signed,  Kathlyn Sacramento, MD  05/29/2019 11:00 AM    [place]

## 2019-07-09 ENCOUNTER — Encounter: Payer: Self-pay | Admitting: Family Medicine

## 2019-08-13 ENCOUNTER — Other Ambulatory Visit: Payer: Self-pay | Admitting: Adult Health

## 2019-10-03 ENCOUNTER — Encounter: Payer: Self-pay | Admitting: Family Medicine

## 2020-03-31 DIAGNOSIS — H2513 Age-related nuclear cataract, bilateral: Secondary | ICD-10-CM | POA: Diagnosis not present

## 2020-04-23 ENCOUNTER — Other Ambulatory Visit: Payer: Self-pay | Admitting: Family Medicine

## 2020-04-28 ENCOUNTER — Ambulatory Visit: Payer: Self-pay | Admitting: *Deleted

## 2020-05-27 DIAGNOSIS — H52203 Unspecified astigmatism, bilateral: Secondary | ICD-10-CM | POA: Diagnosis not present

## 2020-05-27 DIAGNOSIS — H2513 Age-related nuclear cataract, bilateral: Secondary | ICD-10-CM | POA: Diagnosis not present

## 2020-05-27 DIAGNOSIS — H5213 Myopia, bilateral: Secondary | ICD-10-CM | POA: Diagnosis not present

## 2020-05-27 DIAGNOSIS — H524 Presbyopia: Secondary | ICD-10-CM | POA: Diagnosis not present

## 2020-08-14 ENCOUNTER — Other Ambulatory Visit: Payer: Self-pay

## 2020-08-18 ENCOUNTER — Other Ambulatory Visit: Payer: Self-pay

## 2020-08-18 ENCOUNTER — Telehealth: Payer: Self-pay | Admitting: *Deleted

## 2020-08-18 ENCOUNTER — Encounter: Payer: Self-pay | Admitting: Family Medicine

## 2020-08-18 ENCOUNTER — Telehealth (INDEPENDENT_AMBULATORY_CARE_PROVIDER_SITE_OTHER): Payer: Medicare Other | Admitting: Family Medicine

## 2020-08-18 VITALS — BP 196/89 | HR 59 | Temp 97.5°F

## 2020-08-18 DIAGNOSIS — R739 Hyperglycemia, unspecified: Secondary | ICD-10-CM

## 2020-08-18 DIAGNOSIS — D696 Thrombocytopenia, unspecified: Secondary | ICD-10-CM | POA: Diagnosis not present

## 2020-08-18 DIAGNOSIS — R600 Localized edema: Secondary | ICD-10-CM | POA: Diagnosis not present

## 2020-08-18 DIAGNOSIS — E785 Hyperlipidemia, unspecified: Secondary | ICD-10-CM

## 2020-08-18 DIAGNOSIS — L309 Dermatitis, unspecified: Secondary | ICD-10-CM

## 2020-08-18 DIAGNOSIS — N289 Disorder of kidney and ureter, unspecified: Secondary | ICD-10-CM

## 2020-08-18 DIAGNOSIS — I10 Essential (primary) hypertension: Secondary | ICD-10-CM

## 2020-08-18 MED ORDER — TRIAMCINOLONE ACETONIDE 0.1 % EX CREA: TOPICAL_CREAM | CUTANEOUS | 1 refills | Status: DC

## 2020-08-18 NOTE — Progress Notes (Signed)
Virtual Visit via Phone Note  I connected with Steven Morrison on 08/18/20 at  9:40 AM EST by a phone enabled telemedicine application and verified that I am speaking with the correct person using two identifiers.  Location: Patient: home, patient, daughter and provider in visit Provider: office   I discussed the limitations of evaluation and management by telemedicine and the availability of in person appointments. The patient expressed understanding and agreed to proceed. Tric Fergersen, CMA was able to get the patient setup on a phone visit after being unable to set up a video visit     Subjective:    Patient ID: Steven Morrison, male    DOB: 05-Jan-1928, 87 y.o.   MRN: [id number]  Chief Complaint  Patient presents with  . Hypertension    Follow up  . Hyperlipidemia    Follow up    HPI Patient is in today for follow-up on chronic medical concerns.  He is accompanied by his daughter.  They report he is doing well.  No recent febrile illness or hospitalizations.  He continues to stay active and maintain a heart healthy diet.  He is noting some increased edema in his ankles.  Worse by the end of the day and better in the morning.  Does not travel up his leg.  No associated shortness of breath or recent illness or concern.Denies CP/palp/SOB/HA/congestion/fevers/GI or GU c/o. Taking meds as prescribed  Past Medical History:  Diagnosis Date  . Anxiety state, unspecified 12/19/2013  . Benign essential HTN 09/12/2011   Does not tolerate thiazide diuretics developed very bad dermatitis   . Constipation 02/10/2012  . Dermatitis 09/13/2012  . Elevated PSA 11/03/2011  . HTN (hypertension) 09/12/2011  . Hyperlipidemia, mild 04/17/2015  . Intertrigo 03/20/2012  . Medicare annual wellness visit, subsequent 09/12/2011  . Rash, skin 01/02/2012   Left lower leg  . Restless sleeper 01/04/2017  . Thrombocytopenia ([place]) 11/17/2011   slight  . Uncontrolled hypertension 09/12/2011  . Urinary  hesitancy 10/12/2011  . Urinary urgency 10/12/2011    No past surgical history on file.  Family History  Problem Relation Age of Onset  . Migraines Daughter   . Allergies Daughter   . Allergies Son     Social History   Socioeconomic History  . Marital status: Divorced    Spouse name: Not on file  . Number of children: Not on file  . Years of education: Not on file  . Highest education level: Not on file  Occupational History  . Not on file  Tobacco Use  . Smoking status: Never Smoker  . Smokeless tobacco: Never Used  Substance and Sexual Activity  . Alcohol use: Yes    Comment: occasionally  . Drug use: No  . Sexual activity: Not Currently  Other Topics Concern  . Not on file  Social History Narrative  . Not on file   Social Determinants of Health   Financial Resource Strain: Not on file  Food Insecurity: Not on file  Transportation Needs: Not on file  Physical Activity: Not on file  Stress: Not on file  Social Connections: Not on file  Intimate Partner Violence: Not on file    Outpatient Medications Prior to Visit  Medication Sig Dispense Refill  . aspirin EC 81 MG tablet Take 1 tablet (81 mg total) by mouth daily.    Marland Kitchen BYSTOLIC 10 MG tablet TAKE 1 TABLET(10 MG) BY MOUTH DAILY 60 tablet 5  . losartan (COZAAR) 50 MG tablet TAKE  1 TABLET(50 MG) BY MOUTH DAILY 90 tablet 3  . neomycin-colistin-hydrocortisone-thonzonium (CORTISPORIN-TC) 3.10-16-08-0.5 MG/ML OTIC suspension Place 3 drops into the left ear 3 (three) times daily. 10 mL 0  . triamcinolone cream (KENALOG) 0.1 % APPLY EXTERNALLY TO THE AFFECTED AREA TWICE DAILY 80 g 1   No facility-administered medications prior to visit.    Allergies  Allergen Reactions  . Hctz [Hydrochlorothiazide] Rash  . Bactrim [Sulfamethoxazole-Trimethoprim] Rash  . Losartan Palpitations    Palpitations and fever    ROS     Objective:    Physical Exam  BP (!) 196/89   Pulse (!) 59   Temp (!) 97.5 F (36.4 C)   SpO2  97%  Wt Readings from Last 3 Encounters:  05/29/19 143 lb (64.9 kg)  04/30/19 138 lb (62.6 kg)  10/19/18 142 lb 6.4 oz (64.6 kg)    Diabetic Foot Exam - Simple   No data filed    Lab Results  Component Value Date   WBC 6.0 10/19/2018   HGB 13.8 10/19/2018   HCT 42.6 10/19/2018   PLT 114.0 (L) 10/19/2018   GLUCOSE 78 10/19/2018   CHOL 153 10/19/2018   TRIG 129.0 10/19/2018   HDL 42.70 10/19/2018   LDLDIRECT 124.0 09/06/2016   LDLCALC 84 10/19/2018   ALT 19 10/19/2018   AST 21 10/19/2018   NA 137 10/19/2018   K 4.4 10/19/2018   CL 101 10/19/2018   CREATININE 1.23 10/19/2018   BUN 12 10/19/2018   CO2 24 10/19/2018   TSH 1.82 10/19/2018   PSA 5.70 (H) 10/12/2011   HGBA1C 6.0 10/19/2018    Lab Results  Component Value Date   TSH 1.82 10/19/2018   Lab Results  Component Value Date   WBC 6.0 10/19/2018   HGB 13.8 10/19/2018   HCT 42.6 10/19/2018   MCV 85.0 10/19/2018   PLT 114.0 (L) 10/19/2018   Lab Results  Component Value Date   NA 137 10/19/2018   K 4.4 10/19/2018   CO2 24 10/19/2018   GLUCOSE 78 10/19/2018   BUN 12 10/19/2018   CREATININE 1.23 10/19/2018   BILITOT 0.4 10/19/2018   ALKPHOS 56 10/19/2018   AST 21 10/19/2018   ALT 19 10/19/2018   PROT 7.3 10/19/2018   ALBUMIN 4.3 10/19/2018   CALCIUM 8.9 10/19/2018   ANIONGAP 9 03/04/2018   GFR 55.46 (L) 10/19/2018   Lab Results  Component Value Date   CHOL 153 10/19/2018   Lab Results  Component Value Date   HDL 42.70 10/19/2018   Lab Results  Component Value Date   LDLCALC 84 10/19/2018   Lab Results  Component Value Date   TRIG 129.0 10/19/2018   Lab Results  Component Value Date   CHOLHDL 4 10/19/2018   Lab Results  Component Value Date   HGBA1C 6.0 10/19/2018       Assessment & Plan:   Problem List Items Addressed This Visit    Benign essential HTN - Primary    Monitor and report any concerns no changes to meds. Encouraged heart healthy diet such as the DASH diet and  exercise as tolerated.       Relevant Orders   CBC with Differential/Platelet   Comprehensive metabolic panel   TSH   Thrombocytopenia (HCC)    Asymptomatic, labs ordered at [facility] to monitor      Dermatitis    Rash comes and goes but responds to kenalog prn, refill given today      Hyperglycemia  hgba1c acceptable, minimize simple carbs. Increase exercise as tolerated      Relevant Orders   Hemoglobin A1c   Hyperlipidemia, mild    Maintain heart healthy diet, increase exercise, avoid processed foods and monitor      Relevant Orders   Lipid panel   Renal insufficiency    Very mildly suppressed GFR at last check, hydrate and check cmp       Pedal edema    Minimize sodium, elevate feet above heart tid and prn and try compression socks, on in am off in pm, notify us if worsens or no response         I have changed Mr. Clive C. Fricker's triamcinolone. I am also having him maintain his aspirin EC, neomycin-colistin-hydrocortisone-thonzonium, losartan, and Bystolic.  Meds ordered this encounter  Medications  . triamcinolone (KENALOG) 0.1 %    Sig: APPLY EXTERNALLY TO THE AFFECTED AREA TWICE DAILY    Dispense:  80 g    Refill:  1     I discussed the assessment and treatment plan with the patient. The patient was provided an opportunity to ask questions and all were answered. The patient agreed with the plan and demonstrated an understanding of the instructions.   The patient was advised to call back or seek an in-person evaluation if the symptoms worsen or if the condition fails to improve as anticipated.  I provided 25 minutes of non-face-to-face time during this encounter.   [name], MD

## 2020-08-18 NOTE — Assessment & Plan Note (Signed)
hgba1c acceptable, minimize simple carbs. Increase exercise as tolerated.  

## 2020-08-18 NOTE — Assessment & Plan Note (Signed)
Asymptomatic, labs ordered at [facility] to monitor

## 2020-08-18 NOTE — Assessment & Plan Note (Signed)
Rash comes and goes but responds to kenalog prn, refill given today

## 2020-08-18 NOTE — Assessment & Plan Note (Signed)
Minimize sodium, elevate feet above heart tid and prn and try compression socks, on in am off in pm, notify us if worsens or no response

## 2020-08-18 NOTE — Telephone Encounter (Signed)
Attempted to reach pt to schedule IN Person f/u with Dr Abner Greenspan in 3 to 4 months.(April or May) and reached voicemail.  Spoke with daughter, Steven Morrison. She does not have her work calendar and states she will call us back to schedule the appointment.

## 2020-08-18 NOTE — Assessment & Plan Note (Signed)
Maintain heart healthy diet, increase exercise, avoid processed foods and monitor

## 2020-08-18 NOTE — Assessment & Plan Note (Signed)
Monitor and report any concerns. no changes to meds. Encouraged heart healthy diet such as the DASH diet and exercise as tolerated.  

## 2020-08-18 NOTE — Assessment & Plan Note (Signed)
Very mildly suppressed GFR at last check, hydrate and check cmp

## 2020-08-27 NOTE — Telephone Encounter (Signed)
Pt's daughter has not called back to schedule f/u and labs from 1/3 have not been completed.  Mailed letter to pt reminding him to schedule in person appt and to complete labs as soon as possible.

## 2021-03-27 ENCOUNTER — Other Ambulatory Visit (INDEPENDENT_AMBULATORY_CARE_PROVIDER_SITE_OTHER): Payer: Medicare Other

## 2021-03-27 DIAGNOSIS — R739 Hyperglycemia, unspecified: Secondary | ICD-10-CM | POA: Diagnosis not present

## 2021-03-27 DIAGNOSIS — I10 Essential (primary) hypertension: Secondary | ICD-10-CM

## 2021-03-27 DIAGNOSIS — E785 Hyperlipidemia, unspecified: Secondary | ICD-10-CM | POA: Diagnosis not present

## 2021-03-27 LAB — COMPREHENSIVE METABOLIC PANEL
ALT: 20 U/L (ref 0–53)
AST: 23 U/L (ref 0–37)
Albumin: 4.4 g/dL (ref 3.5–5.2)
Alkaline Phosphatase: 56 U/L (ref 39–117)
BUN: 15 mg/dL (ref 6–23)
CO2: 29 mEq/L (ref 19–32)
Calcium: 9.1 mg/dL (ref 8.4–10.5)
Chloride: 101 mEq/L (ref 96–112)
Creatinine, Ser: 1.3 mg/dL (ref 0.40–1.50)
GFR: 48.18 mL/min — ABNORMAL LOW (ref >60.00)
Glucose, Bld: 107 mg/dL — ABNORMAL HIGH (ref 70–99)
Potassium: 4.2 mEq/L (ref 3.5–5.1)
Sodium: 138 mEq/L (ref 135–145)
Total Bilirubin: 0.7 mg/dL (ref 0.2–1.2)
Total Protein: 7.6 g/dL (ref 6.0–8.3)

## 2021-03-27 LAB — CBC WITH DIFFERENTIAL/PLATELET
Basophils Absolute: 0.1 10*3/uL (ref 0.0–0.1)
Basophils Relative: 1.2 % (ref 0.0–3.0)
Eosinophils Absolute: 0.3 10*3/uL (ref 0.0–0.7)
Eosinophils Relative: 4.9 % (ref 0.0–5.0)
HCT: 42.1 % (ref 39.0–52.0)
Hemoglobin: 13.6 g/dL (ref 13.0–17.0)
Lymphocytes Relative: 47.2 % — ABNORMAL HIGH (ref 12.0–46.0)
Lymphs Abs: 2.8 10*3/uL (ref 0.7–4.0)
MCHC: 32.4 g/dL (ref 30.0–36.0)
MCV: 83.5 fl (ref 78.0–100.0)
Monocytes Absolute: 0.6 10*3/uL (ref 0.1–1.0)
Monocytes Relative: 9.3 % (ref 3.0–12.0)
Neutro Abs: 2.2 10*3/uL (ref 1.4–7.7)
Neutrophils Relative %: 37.4 % — ABNORMAL LOW (ref 43.0–77.0)
Platelets: 153 10*3/uL (ref 150.0–400.0)
RBC: 5.04 Mil/uL (ref 4.22–5.81)
RDW: 14.9 % (ref 11.5–15.5)
WBC: 5.9 10*3/uL (ref 4.0–10.5)

## 2021-03-27 LAB — LIPID PANEL
Cholesterol: 184 mg/dL (ref 0–200)
HDL: 43.3 mg/dL (ref >39.00)
LDL Cholesterol: 119 mg/dL — ABNORMAL HIGH (ref 0–99)
NonHDL: 140.9
Total CHOL/HDL Ratio: 4
Triglycerides: 111 mg/dL (ref 0.0–149.0)
VLDL: 22.2 mg/dL (ref 0.0–40.0)

## 2021-03-27 LAB — TSH: TSH: 2.24 u[IU]/mL (ref 0.35–5.50)

## 2021-03-27 LAB — HEMOGLOBIN A1C: Hgb A1c MFr Bld: 6.3 % (ref 4.6–6.5)

## 2021-03-31 ENCOUNTER — Encounter: Payer: Self-pay | Admitting: Family Medicine

## 2021-03-31 ENCOUNTER — Other Ambulatory Visit: Payer: Self-pay

## 2021-03-31 ENCOUNTER — Ambulatory Visit (INDEPENDENT_AMBULATORY_CARE_PROVIDER_SITE_OTHER): Payer: Medicare Other | Admitting: Family Medicine

## 2021-03-31 VITALS — BP 142/82 | HR 57 | Temp 98.5°F | Resp 12 | Ht 62.0 in | Wt 141.6 lb

## 2021-03-31 DIAGNOSIS — N289 Disorder of kidney and ureter, unspecified: Secondary | ICD-10-CM | POA: Diagnosis not present

## 2021-03-31 DIAGNOSIS — R739 Hyperglycemia, unspecified: Secondary | ICD-10-CM

## 2021-03-31 DIAGNOSIS — R972 Elevated prostate specific antigen [PSA]: Secondary | ICD-10-CM

## 2021-03-31 DIAGNOSIS — R3911 Hesitancy of micturition: Secondary | ICD-10-CM

## 2021-03-31 DIAGNOSIS — E785 Hyperlipidemia, unspecified: Secondary | ICD-10-CM | POA: Diagnosis not present

## 2021-03-31 DIAGNOSIS — I10 Essential (primary) hypertension: Secondary | ICD-10-CM

## 2021-03-31 DIAGNOSIS — Z789 Other specified health status: Secondary | ICD-10-CM

## 2021-03-31 MED ORDER — TAMSULOSIN HCL 0.4 MG PO CAPS: 0.4000 mg | ORAL_CAPSULE | Freq: Every day | ORAL | 3 refills | Status: DC

## 2021-03-31 NOTE — Patient Instructions (Signed)
  Paxlovid is the new COVID medication we can give you if you get COVID so make sure you test if you have symptoms because we have to treat by day 5 of symptoms for it to be effective. If you are positive let us know so we can treat. If a home test is negative and your symptoms are persistent get a PCR test. Can check testing locations at [email or web address] If you are positive we will make an appointment with [place] and we will send in Paxlovid if you would like it. Check with your pharmacy before we meet to confirm they have it in stock, if they do not then we can get the prescription at the [facility]

## 2021-03-31 NOTE — Progress Notes (Signed)
Patient ID: Steven Morrison, male    DOB: 1927/12/20  Age: 87 y.o. MRN: [id number]    Subjective:  Subjective  HPI Steven Morrison presents for office visit today for follow up on htn and hyperlipidemia. He states that he is doing well and has no recent hospitalizations or ER visits to report. Denies CP/palp/SOB/HA/congestion/fevers or GI c/o. Taking meds as prescribed.  He reports that he has started exercising 3x a week walking and has recently started weight training. He endorses having a vegetarian diet and eats some cheese, oat milk, and yogurt. He reports having difficulty urinating, urgency, and urine decrease, but denies any dysuria. He endorses having to get up at night to urinate 4x daily.  Review of Systems  Constitutional:  Negative for chills, fatigue and fever.  HENT:  Negative for congestion, rhinorrhea, sinus pressure, sinus pain, sore throat and trouble swallowing.   Eyes:  Negative for pain.  Respiratory:  Negative for cough and shortness of breath.   Cardiovascular:  Negative for chest pain, palpitations and leg swelling.  Gastrointestinal:  Negative for abdominal pain, blood in stool, diarrhea, nausea and vomiting.  Genitourinary:  Positive for decreased urine volume, difficulty urinating and urgency. Negative for dysuria, flank pain and penile pain.  Musculoskeletal:  Negative for back pain.  Neurological:  Negative for headaches.   History Past Medical History:  Diagnosis Date   Anxiety state, unspecified 12/19/2013   Benign essential HTN 09/12/2011   Does not tolerate thiazide diuretics developed very bad dermatitis    Constipation 02/10/2012   Dermatitis 09/13/2012   Elevated PSA 11/03/2011   HTN (hypertension) 09/12/2011   Hyperlipidemia, mild 04/17/2015   Intertrigo 03/20/2012   Medicare annual wellness visit, subsequent 09/12/2011   Rash, skin 01/02/2012   Left lower leg   Restless sleeper 01/04/2017   Thrombocytopenia ([place]) 11/17/2011   slight    Uncontrolled hypertension 09/12/2011   Urinary hesitancy 10/12/2011   Urinary urgency 10/12/2011    He has no past surgical history on file.   His family history includes Allergies in his daughter and son; Migraines in his daughter.He reports that he has never smoked. He has never used smokeless tobacco. He reports current alcohol use. He reports that he does not use drugs.  Current Outpatient Medications on File Prior to Visit  Medication Sig Dispense Refill   aspirin EC 81 MG tablet Take 1 tablet (81 mg total) by mouth daily.     BYSTOLIC 10 MG tablet TAKE 1 TABLET(10 MG) BY MOUTH DAILY 60 tablet 5   triamcinolone (KENALOG) 0.1 % APPLY EXTERNALLY TO THE AFFECTED AREA TWICE DAILY 80 g 1   No current facility-administered medications on file prior to visit.     Objective:  Objective  Physical Exam Constitutional:      General: He is not in acute distress.    Appearance: Normal appearance. He is not ill-appearing or toxic-appearing.  HENT:     Head: Normocephalic and atraumatic.     Right Ear: Tympanic membrane, ear canal and external ear normal.     Left Ear: Tympanic membrane, ear canal and external ear normal.     Nose: No congestion or rhinorrhea.  Eyes:     Extraocular Movements: Extraocular movements intact.     Pupils: Pupils are equal, round, and reactive to light.  Cardiovascular:     Rate and Rhythm: Normal rate and regular rhythm.     Pulses: Normal pulses.     Heart sounds: Normal heart sounds. No  murmur heard. Pulmonary:     Effort: Pulmonary effort is normal. No respiratory distress.     Breath sounds: Normal breath sounds. No wheezing, rhonchi or rales.  Abdominal:     General: Bowel sounds are normal.     Palpations: Abdomen is soft. There is no mass.     Tenderness: no abdominal tenderness There is no guarding.     Hernia: No hernia is present.  Musculoskeletal:        General: Normal range of motion.     Cervical back: Normal range of motion and neck  supple.  Skin:    General: Skin is warm and dry.  Neurological:     Mental Status: He is alert and oriented to person, place, and time.  Psychiatric:        Behavior: Behavior normal.   BP (!) 142/82 (BP Location: Left Arm, Cuff Size: Large)   Pulse (!) 57   Temp 98.5 F (36.9 C) (Oral)   Resp 12   Ht '5\' 2"'$  (1.575 m)   Wt 141 lb 9.6 oz (64.2 kg)   SpO2 98%   BMI 25.90 kg/m  Wt Readings from Last 3 Encounters:  03/31/21 141 lb 9.6 oz (64.2 kg)  05/29/19 143 lb (64.9 kg)  04/30/19 138 lb (62.6 kg)     Lab Results  Component Value Date   WBC 5.9 03/27/2021   HGB 13.6 03/27/2021   HCT 42.1 03/27/2021   PLT 153.0 03/27/2021   GLUCOSE 107 (H) 03/27/2021   CHOL 184 03/27/2021   TRIG 111.0 03/27/2021   HDL 43.30 03/27/2021   LDLDIRECT 124.0 09/06/2016   LDLCALC 119 (H) 03/27/2021   ALT 20 03/27/2021   AST 23 03/27/2021   NA 138 03/27/2021   K 4.2 03/27/2021   CL 101 03/27/2021   CREATININE 1.30 03/27/2021   BUN 15 03/27/2021   CO2 29 03/27/2021   TSH 2.24 03/27/2021   PSA 9.37 (H) 03/31/2021   HGBA1C 6.3 03/27/2021    DG Chest 2 View  Result Date: 03/04/2018 CLINICAL DATA:  Chest pain and left arm weakness beginning today. EXAM: CHEST - 2 VIEW COMPARISON:  None. FINDINGS: The heart size and mediastinal contours are within normal limits. Both lungs are clear. The visualized skeletal structures are unremarkable. IMPRESSION: No active cardiopulmonary disease. Electronically Signed   By: Earle Gell M.D.   On: 03/04/2018 16:17     Assessment & Plan:  Plan    Meds ordered this encounter  Medications   tamsulosin (FLOMAX) 0.4 MG CAPS capsule    Sig: Take 1 capsule (0.4 mg total) by mouth daily.    Dispense:  30 capsule    Refill:  3     Problem List Items Addressed This Visit     Benign essential HTN    Well controlled, no changes to meds. Encouraged heart healthy diet such as the DASH diet and exercise as tolerated.       Elevated PSA    Increasingly  symptomatic and psa rising, referred to urology for further evaluation. Started on Flomax for obstructive symptoms      Relevant Orders   PSA (Completed)   Hyperglycemia    hgba1c acceptable, minimize simple carbs. Increase exercise as tolerated.      Hyperlipidemia, mild    Encourage heart healthy diet such as MIND or DASH diet, increase exercise, avoid trans fats, simple carbohydrates and processed foods, consider a krill or fish or flaxseed oil cap daily.  Renal insufficiency    Hydrate and monitor      Other Visit Diagnoses     Urinary hesitancy    -  Primary   Relevant Orders   Urinalysis (Completed)   Urine Culture (Completed)   Vegetarian diet       Relevant Orders   Zinc (Completed)   Magnesium (Completed)   Vitamin B12 (Completed)   Vitamin B1       Follow-up: Return in about 3 months (around 07/01/2021), or 3 mn vv fu and 6 mn in person CPE.  I, Suezanne Jacquet, acting as a scribe for [name], MD, have documented all relevent documentation on behalf of [name], MD, as directed by [name], MD while in the presence of [name], MD.  I, [name], MD personally performed the services described in this documentation. All medical record entries made by the scribe were at my direction and in my presence. I have reviewed the chart and agree that the record reflects my personal performance and is accurate and complete

## 2021-04-01 ENCOUNTER — Other Ambulatory Visit: Payer: Self-pay | Admitting: Family Medicine

## 2021-04-01 DIAGNOSIS — R972 Elevated prostate specific antigen [PSA]: Secondary | ICD-10-CM

## 2021-04-01 DIAGNOSIS — R3911 Hesitancy of micturition: Secondary | ICD-10-CM

## 2021-04-01 LAB — VITAMIN B12: Vitamin B-12: 629 pg/mL (ref 211–911)

## 2021-04-01 LAB — URINALYSIS
Bilirubin Urine: NEGATIVE
Hgb urine dipstick: NEGATIVE
Ketones, ur: NEGATIVE
Leukocytes,Ua: NEGATIVE
Nitrite: NEGATIVE
Specific Gravity, Urine: 1.01 (ref 1.000–1.030)
Urine Glucose: NEGATIVE
Urobilinogen, UA: 0.2 (ref 0.0–1.0)
pH: 8 (ref 5.0–8.0)

## 2021-04-01 LAB — PSA: PSA: 9.37 ng/mL — ABNORMAL HIGH (ref 0.10–4.00)

## 2021-04-01 LAB — MAGNESIUM: Magnesium: 2.1 mg/dL (ref 1.5–2.5)

## 2021-04-02 LAB — URINE CULTURE
MICRO NUMBER:: 12249390
SPECIMEN QUALITY:: ADEQUATE

## 2021-04-02 LAB — ZINC: Zinc: 70 ug/dL (ref 60–130)

## 2021-04-05 NOTE — Assessment & Plan Note (Signed)
hgba1c acceptable, minimize simple carbs. Increase exercise as tolerated.  

## 2021-04-05 NOTE — Assessment & Plan Note (Signed)
Encourage heart healthy diet such as MIND or DASH diet, increase exercise, avoid trans fats, simple carbohydrates and processed foods, consider a krill or fish or flaxseed oil cap daily.  °

## 2021-04-05 NOTE — Assessment & Plan Note (Signed)
Increasingly symptomatic and psa rising, referred to urology for further evaluation. Started on Flomax for obstructive symptoms

## 2021-04-05 NOTE — Assessment & Plan Note (Signed)
Hydrate and monitor 

## 2021-04-05 NOTE — Assessment & Plan Note (Signed)
Well controlled, no changes to meds. Encouraged heart healthy diet such as the DASH diet and exercise as tolerated.  °

## 2021-04-07 LAB — VITAMIN B1: Vitamin B1 (Thiamine): 20 nmol/L (ref 8–30)

## 2021-05-13 DIAGNOSIS — R3914 Feeling of incomplete bladder emptying: Secondary | ICD-10-CM | POA: Diagnosis not present

## 2021-05-13 DIAGNOSIS — R3912 Poor urinary stream: Secondary | ICD-10-CM | POA: Diagnosis not present

## 2021-05-23 ENCOUNTER — Other Ambulatory Visit: Payer: Self-pay | Admitting: Family Medicine

## 2021-07-14 ENCOUNTER — Telehealth (INDEPENDENT_AMBULATORY_CARE_PROVIDER_SITE_OTHER): Payer: Medicare Other | Admitting: Family Medicine

## 2021-07-14 ENCOUNTER — Other Ambulatory Visit: Payer: Self-pay

## 2021-07-14 VITALS — BP 200/83 | HR 58 | Temp 97.3°F | Wt 141.0 lb

## 2021-07-14 DIAGNOSIS — R3911 Hesitancy of micturition: Secondary | ICD-10-CM

## 2021-07-14 DIAGNOSIS — R739 Hyperglycemia, unspecified: Secondary | ICD-10-CM | POA: Diagnosis not present

## 2021-07-14 DIAGNOSIS — E785 Hyperlipidemia, unspecified: Secondary | ICD-10-CM

## 2021-07-14 DIAGNOSIS — F32A Depression, unspecified: Secondary | ICD-10-CM

## 2021-07-14 DIAGNOSIS — I10 Essential (primary) hypertension: Secondary | ICD-10-CM

## 2021-07-14 DIAGNOSIS — F419 Anxiety disorder, unspecified: Secondary | ICD-10-CM

## 2021-07-14 DIAGNOSIS — N289 Disorder of kidney and ureter, unspecified: Secondary | ICD-10-CM

## 2021-07-14 MED ORDER — FLUOXETINE HCL 20 MG PO TABS: 20.0000 mg | ORAL_TABLET | Freq: Every day | ORAL | 3 refills | Status: DC

## 2021-07-15 NOTE — Progress Notes (Signed)
MyChart Video Visit    Virtual Visit via Video Note   This visit type was conducted due to national recommendations for restrictions regarding the COVID-19 Pandemic (e.g. social distancing) in an effort to limit this patient's exposure and mitigate transmission in our community. This patient is at least at moderate risk for complications without adequate follow up. This format is felt to be most appropriate for this patient at this time. Physical exam was limited by quality of the video and audio technology used for the visit. S Chism, CMA was able to get the patient set up on a video visit.  Patient location: Home, patient's daughter, Patient and provider in visit Provider location: Office  I discussed the limitations of evaluation and management by telemedicine and the availability of in person appointments. The patient expressed understanding and agreed to proceed.  Visit Date: 07/14/2021  Today's healthcare provider: [name], MD     Subjective:    Patient ID: Steven Morrison, male    DOB: [birth year], 87 y.o.   MRN: [id number]  No chief complaint on file.   HPI Patient is in today for evaluation of high blood pressure and anxiety. He and his daughter note he gets worse every winter and he is struggling with anxiety and depression more now.  He notes anhedonia and feeling very anxious and apprehensive frequently. They think his pressure has been running high due to his anxiety. No concerning associated symptoms. No recent febrile illness or hospitalizations. Denies CP/palp/SOB/HA/congestion/fevers/GI or GU c/o. Taking meds as prescribed   Past Medical History:  Diagnosis Date   Anxiety state, unspecified 12/19/2013   Benign essential HTN 09/12/2011   Does not tolerate thiazide diuretics developed very bad dermatitis    Constipation 02/10/2012   Dermatitis 09/13/2012   Elevated PSA 11/03/2011   HTN (hypertension) 09/12/2011   Hyperlipidemia, mild 04/17/2015   Intertrigo  03/20/2012   Medicare annual wellness visit, subsequent 09/12/2011   Rash, skin 01/02/2012   Left lower leg   Restless sleeper 01/04/2017   Thrombocytopenia ([place]) 11/17/2011   slight   Uncontrolled hypertension 09/12/2011   Urinary hesitancy 10/12/2011   Urinary urgency 10/12/2011    No past surgical history on file.  Family History  Problem Relation Age of Onset   Migraines Daughter    Allergies Daughter    Allergies Son     Social History   Socioeconomic History   Marital status: Divorced    Spouse name: Not on file   Number of children: Not on file   Years of education: Not on file   Highest education level: Not on file  Occupational History   Not on file  Tobacco Use   Smoking status: Never   Smokeless tobacco: Never  Substance and Sexual Activity   Alcohol use: Yes    Comment: occasionally   Drug use: No   Sexual activity: Not Currently  Other Topics Concern   Not on file  Social History Narrative   Not on file   Social Determinants of Health   Financial Resource Strain: Not on file  Food Insecurity: Not on file  Transportation Needs: Not on file  Physical Activity: Not on file  Stress: Not on file  Social Connections: Not on file  Intimate Partner Violence: Not on file    Outpatient Medications Prior to Visit  Medication Sig Dispense Refill   aspirin EC 81 MG tablet Take 1 tablet (81 mg total) by mouth daily.     BYSTOLIC 10  MG tablet TAKE 1 TABLET(10 MG) BY MOUTH DAILY 60 tablet 5   tamsulosin (FLOMAX) 0.4 MG CAPS capsule Take 1 capsule (0.4 mg total) by mouth daily. 30 capsule 3   triamcinolone (KENALOG) 0.1 % APPLY EXTERNALLY TO THE AFFECTED AREA TWICE DAILY 80 g 1   No facility-administered medications prior to visit.    Allergies  Allergen Reactions   Hctz [Hydrochlorothiazide] Rash   Bactrim [Sulfamethoxazole-Trimethoprim] Rash   Losartan Palpitations    Palpitations and fever    Review of Systems  Constitutional:  Positive for  malaise/fatigue. Negative for fever.  HENT:  Negative for congestion.   Eyes:  Negative for blurred vision.  Respiratory:  Negative for shortness of breath.   Cardiovascular:  Negative for chest pain, palpitations and leg swelling.  Gastrointestinal:  Negative for abdominal pain, blood in stool and nausea.  Genitourinary:  Negative for dysuria and frequency.  Musculoskeletal:  Negative for falls.  Skin:  Negative for rash.  Neurological:  Negative for dizziness, loss of consciousness and headaches.  Endo/Heme/Allergies:  Negative for environmental allergies.  Psychiatric/Behavioral:  Positive for depression. Negative for suicidal ideas. The patient is nervous/anxious.       Objective:    Physical Exam Constitutional:      General: He is not in acute distress.    Appearance: Normal appearance. He is not ill-appearing or toxic-appearing.  HENT:     Head: Normocephalic and atraumatic.     Right Ear: External ear normal.     Left Ear: External ear normal.     Nose: Nose normal.  Eyes:     General:        Right eye: No discharge.        Left eye: No discharge.  Pulmonary:     Effort: Pulmonary effort is normal.  Skin:    Findings: No rash.  Neurological:     Mental Status: He is alert and oriented to person, place, and time.  Psychiatric:        Behavior: Behavior normal.    BP (!) 200/83   Pulse (!) 58   Temp (!) 97.3 F (36.3 C)   Wt 141 lb (64 kg)   SpO2 97%   BMI 25.79 kg/m  Wt Readings from Last 3 Encounters:  07/14/21 141 lb (64 kg)  03/31/21 141 lb 9.6 oz (64.2 kg)  05/29/19 143 lb (64.9 kg)    Diabetic Foot Exam - Simple   No data filed    Lab Results  Component Value Date   WBC 5.9 03/27/2021   HGB 13.6 03/27/2021   HCT 42.1 03/27/2021   PLT 153.0 03/27/2021   GLUCOSE 107 (H) 03/27/2021   CHOL 184 03/27/2021   TRIG 111.0 03/27/2021   HDL 43.30 03/27/2021   LDLDIRECT 124.0 09/06/2016   LDLCALC 119 (H) 03/27/2021   ALT 20 03/27/2021   AST 23  03/27/2021   NA 138 03/27/2021   K 4.2 03/27/2021   CL 101 03/27/2021   CREATININE 1.30 03/27/2021   BUN 15 03/27/2021   CO2 29 03/27/2021   TSH 2.24 03/27/2021   PSA 9.37 (H) 03/31/2021   HGBA1C 6.3 03/27/2021    Lab Results  Component Value Date   TSH 2.24 03/27/2021   Lab Results  Component Value Date   WBC 5.9 03/27/2021   HGB 13.6 03/27/2021   HCT 42.1 03/27/2021   MCV 83.5 03/27/2021   PLT 153.0 03/27/2021   Lab Results  Component Value Date   NA 138  03/27/2021   K 4.2 03/27/2021   CO2 29 03/27/2021   GLUCOSE 107 (H) 03/27/2021   BUN 15 03/27/2021   CREATININE 1.30 03/27/2021   BILITOT 0.7 03/27/2021   ALKPHOS 56 03/27/2021   AST 23 03/27/2021   ALT 20 03/27/2021   PROT 7.6 03/27/2021   ALBUMIN 4.4 03/27/2021   CALCIUM 9.1 03/27/2021   ANIONGAP 9 03/04/2018   GFR 48.18 (L) 03/27/2021   Lab Results  Component Value Date   CHOL 184 03/27/2021   Lab Results  Component Value Date   HDL 43.30 03/27/2021   Lab Results  Component Value Date   LDLCALC 119 (H) 03/27/2021   Lab Results  Component Value Date   TRIG 111.0 03/27/2021   Lab Results  Component Value Date   CHOLHDL 4 03/27/2021   Lab Results  Component Value Date   HGBA1C 6.3 03/27/2021       Assessment & Plan:   Problem List Items Addressed This Visit     Benign essential HTN    His numbers have been up recently for the first time in quite some time. His systolic has been as high as 200. No chest pain, HA or other concerning symptoms and they are not checking his pressure regularly but they do note some numbers have been normal. They agree to check his pressure more regularly along with his pulse and report the numbers to [place]. He is on Bystolic 10 mg daily and if his numbers remain elevated will add 5 mg in the evening for a total of 15 mg a day      Relevant Orders   Lipid panel   CBC with Differential/Platelet   Comprehensive metabolic panel   TSH   Hyperglycemia - Primary     hgba1c acceptable, minimize simple carbs. Increase exercise as tolerated.       Relevant Orders   Hemoglobin A1c   Anxiety state    He and his daughter note he gets worse every winter and he is struggling with anxiety and depression more now.  He notes anhedonia and feeling very anxious and apprehensive frequently.  He agrees to starting fluoxetine 10 mg p.o. daily x7 days and then increasing to 20 mg daily.  They are warned it will take some weeks for him to feel better and they are encouraged to get some seasonal affective disorder lighting to help him through the winter as well.      Relevant Medications   FLUoxetine (PROZAC) 20 MG tablet   Hyperlipidemia, mild    Encourage heart healthy diet such as MIND or DASH diet, increase exercise, avoid trans fats, simple carbohydrates and processed foods, consider a krill or fish or flaxseed oil cap daily. ;      Relevant Orders   Lipid panel   CBC with Differential/Platelet   Comprehensive metabolic panel   TSH   Renal insufficiency    Hydrate and monitor      Other Visit Diagnoses     Urinary hesitancy       Relevant Orders   Urinalysis   Urine Culture       I am having Mr. Davione C. Zollars start on FLUoxetine. I am also having him maintain his aspirin EC, triamcinolone cream, tamsulosin, and Bystolic.  Meds ordered this encounter  Medications   FLUoxetine (PROZAC) 20 MG tablet    Sig: Take 1 tablet (20 mg total) by mouth daily.    Dispense:  30 tablet    Refill:  3    I discussed the assessment and treatment plan with the patient. The patient was provided an opportunity to ask questions and all were answered. The patient agreed with the plan and demonstrated an understanding of the instructions.   The patient was advised to call back or seek an in-person evaluation if the symptoms worsen or if the condition fails to improve as anticipated.  I provided 30 minutes of face-to-face time during this  encounter.   [name], MD [facility] at [facility] [phone] (phone) [phone] (fax)  [place]

## 2021-07-15 NOTE — Assessment & Plan Note (Signed)
hgba1c acceptable, minimize simple carbs. Increase exercise as tolerated.  

## 2021-07-15 NOTE — Assessment & Plan Note (Signed)
Hydrate and monitor 

## 2021-07-15 NOTE — Assessment & Plan Note (Signed)
He and his daughter note he gets worse every winter and he is struggling with anxiety and depression more now.  He notes anhedonia and feeling very anxious and apprehensive frequently.  He agrees to starting fluoxetine 10 mg p.o. daily x7 days and then increasing to 20 mg daily.  They are warned it will take some weeks for him to feel better and they are encouraged to get some seasonal affective disorder lighting to help him through the winter as well.

## 2021-07-15 NOTE — Assessment & Plan Note (Signed)
Encourage heart healthy diet such as MIND or DASH diet, increase exercise, avoid trans fats, simple carbohydrates and processed foods, consider a krill or fish or flaxseed oil cap daily.  °

## 2021-07-15 NOTE — Assessment & Plan Note (Signed)
His numbers have been up recently for the first time in quite some time. His systolic has been as high as 200. No chest pain, HA or other concerning symptoms and they are not checking his pressure regularly but they do note some numbers have been normal. They agree to check his pressure more regularly along with his pulse and report the numbers to [place]. He is on Bystolic 10 mg daily and if his numbers remain elevated will add 5 mg in the evening for a total of 15 mg a day

## 2021-07-17 DIAGNOSIS — R3912 Poor urinary stream: Secondary | ICD-10-CM | POA: Diagnosis not present

## 2021-08-04 ENCOUNTER — Other Ambulatory Visit: Payer: Self-pay | Admitting: Family Medicine

## 2021-10-13 ENCOUNTER — Encounter: Payer: Medicare Other | Admitting: Family Medicine

## 2021-11-04 DIAGNOSIS — R8271 Bacteriuria: Secondary | ICD-10-CM | POA: Diagnosis not present

## 2021-12-04 ENCOUNTER — Encounter: Payer: Self-pay | Admitting: Family Medicine

## 2021-12-04 ENCOUNTER — Other Ambulatory Visit: Payer: Self-pay

## 2021-12-04 MED ORDER — FLUOXETINE HCL 20 MG PO CAPS: 20.0000 mg | ORAL_CAPSULE | Freq: Every day | ORAL | 3 refills | Status: DC

## 2021-12-14 ENCOUNTER — Other Ambulatory Visit: Payer: Self-pay | Admitting: Family Medicine

## 2022-04-06 DIAGNOSIS — H903 Sensorineural hearing loss, bilateral: Secondary | ICD-10-CM | POA: Diagnosis not present

## 2022-04-20 DIAGNOSIS — H35373 Puckering of macula, bilateral: Secondary | ICD-10-CM | POA: Diagnosis not present

## 2022-05-07 ENCOUNTER — Other Ambulatory Visit: Payer: Self-pay

## 2022-05-07 MED ORDER — NEBIVOLOL HCL 10 MG PO TABS: ORAL_TABLET | ORAL | 5 refills | Status: DC

## 2022-06-08 ENCOUNTER — Other Ambulatory Visit: Payer: Self-pay | Admitting: Family Medicine

## 2022-06-09 ENCOUNTER — Encounter: Payer: Self-pay | Admitting: Family

## 2022-06-09 ENCOUNTER — Ambulatory Visit (INDEPENDENT_AMBULATORY_CARE_PROVIDER_SITE_OTHER): Payer: Medicare Other | Admitting: Family

## 2022-06-09 VITALS — BP 215/75 | HR 58 | Temp 97.8°F | Resp 16 | Ht 62.0 in | Wt 136.8 lb

## 2022-06-09 DIAGNOSIS — I10 Essential (primary) hypertension: Secondary | ICD-10-CM

## 2022-06-09 DIAGNOSIS — Z23 Encounter for immunization: Secondary | ICD-10-CM

## 2022-06-09 DIAGNOSIS — F419 Anxiety disorder, unspecified: Secondary | ICD-10-CM | POA: Diagnosis not present

## 2022-06-09 DIAGNOSIS — Z0000 Encounter for general adult medical examination without abnormal findings: Secondary | ICD-10-CM | POA: Insufficient documentation

## 2022-06-09 DIAGNOSIS — C61 Malignant neoplasm of prostate: Secondary | ICD-10-CM

## 2022-06-09 DIAGNOSIS — F32A Depression, unspecified: Secondary | ICD-10-CM

## 2022-06-09 LAB — CBC WITH DIFFERENTIAL/PLATELET
Basophils Absolute: 0.1 10*3/uL (ref 0.0–0.1)
Basophils Relative: 0.9 % (ref 0.0–3.0)
Eosinophils Absolute: 0.2 10*3/uL (ref 0.0–0.7)
Eosinophils Relative: 4.3 % (ref 0.0–5.0)
HCT: 42.2 % (ref 39.0–52.0)
Hemoglobin: 13.5 g/dL (ref 13.0–17.0)
Lymphocytes Relative: 36.2 % (ref 12.0–46.0)
Lymphs Abs: 2 10*3/uL (ref 0.7–4.0)
MCHC: 32.1 g/dL (ref 30.0–36.0)
MCV: 85.7 fl (ref 78.0–100.0)
Monocytes Absolute: 0.5 10*3/uL (ref 0.1–1.0)
Monocytes Relative: 9.5 % (ref 3.0–12.0)
Neutro Abs: 2.8 10*3/uL (ref 1.4–7.7)
Neutrophils Relative %: 49.1 % (ref 43.0–77.0)
Platelets: 175 10*3/uL (ref 150.0–400.0)
RBC: 4.92 Mil/uL (ref 4.22–5.81)
RDW: 15.3 % (ref 11.5–15.5)
WBC: 5.6 10*3/uL (ref 4.0–10.5)

## 2022-06-09 LAB — TSH: TSH: 1.87 u[IU]/mL (ref 0.35–5.50)

## 2022-06-09 MED ORDER — TRIAMCINOLONE ACETONIDE 0.1 % EX CREA: TOPICAL_CREAM | CUTANEOUS | 1 refills | Status: AC

## 2022-06-09 MED ORDER — FLUOXETINE HCL 20 MG PO CAPS
20.0000 mg | ORAL_CAPSULE | Freq: Every day | ORAL | 3 refills | Status: DC
Start: 2022-06-09 — End: 2022-09-28

## 2022-06-09 MED ORDER — AMLODIPINE BESYLATE 5 MG PO TABS: 5.0000 mg | ORAL_TABLET | Freq: Every day | ORAL | 3 refills | Status: DC

## 2022-06-09 NOTE — Progress Notes (Signed)
Subjective:   By signing my name below, I, Shehryar Baig, attest that this documentation has been prepared under the direction and in the presence of Debbrah Alar, NP. 06/09/2022    Patient ID: Steven Morrison, male    DOB: May 03, 1928, 88 y.o.   MRN: [id number]  Chief Complaint  Patient presents with   Annual Exam    HPI Patient is in today for a comprehensive physical exam.   Blood pressure: His blood pressure is elevated during this visit. His daughter reports when he was first seen his blood pressure was in the 300 range. He continues taking 10 mg bystolic daily PO and reports his last dose was this morning.  BP Readings from Last 3 Encounters:  06/09/22 (!) 215/75  07/14/21 (!) 200/83  03/31/21 (!) 142/82   Pulse Readings from Last 3 Encounters:  06/09/22 (!) 58  07/14/21 (!) 58  03/31/21 (!) 57   Refills: He is requesting a refill for 20 mg Prozac daily PO, triamcinolone cream.   Mood: His daughter reports his mood is worsening due to change in whether. He cannot spend most of his time outside in garden. He continues taking 20 mg Prozac daily PO and reports no new issues while taking it.   Acute: He denies having any fever, new muscle pain, joint pain, new moles, congestion, sinus pain, sore throat, chest pain, palpations, cough, SOB, wheezing, n/v/d, constipation, blood in stool, dysuria, frequency, weight change, adenopathy, hematuria, depression, anxiety, headaches at this time.  Social history: He has no surgical history. He occasionally drinks wine. He does not use drugs. He does not use tobacco products.   Immunizations: He is not interested in receiving the flu vaccine during this visit. He is interested in receiving the pneumonia vaccine during this visit. He is interested in receiving the new Covid-19 booster vaccine at a later date.   Diet: He is managing a healthy diet. He does not eat red meat and incorporates beans and nuts for protein. He has  occasional diarrhea.   Exercise: He participates in regular exercise by walking and doing yard work.   PSA: Last completed 03/31/2021. Results showed 9.37. He is positive for prostate cancer. He is following up with a urologist regularly.   Dental: He is UTD on dental care.   Vision: He is UTD on vision care. He has an upcomming cataracts procedure.    Health Maintenance Due  Topic Date Due   Zoster Vaccines- Shingrix (1 of 2) Never done   Medicare Annual Wellness (AWV)  05/25/2020   COVID-19 Vaccine (5 - Moderna series) 04/02/2021   TETANUS/TDAP  09/09/2021    Past Medical History:  Diagnosis Date   Anxiety state, unspecified 12/19/2013   Benign essential HTN 09/12/2011   Does not tolerate thiazide diuretics developed very bad dermatitis    Constipation 02/10/2012   Dermatitis 09/13/2012   Elevated PSA 11/03/2011   HTN (hypertension) 09/12/2011   Hyperlipidemia, mild 04/17/2015   Intertrigo 03/20/2012   Medicare annual wellness visit, subsequent 09/12/2011   Rash, skin 01/02/2012   Left lower leg   Restless sleeper 01/04/2017   Thrombocytopenia ([place]) 11/17/2011   slight   Uncontrolled hypertension 09/12/2011   Urinary hesitancy 10/12/2011   Urinary urgency 10/12/2011    History reviewed. No pertinent surgical history.  Family History  Problem Relation Age of Onset   Migraines Daughter    Allergies Daughter    Allergies Son     Social History   Socioeconomic History  Marital status: Divorced    Spouse name: Not on file   Number of children: Not on file   Years of education: Not on file   Highest education level: Not on file  Occupational History   Not on file  Tobacco Use   Smoking status: Never   Smokeless tobacco: Never  Substance and Sexual Activity   Alcohol use: Yes    Comment: occasionally   Drug use: No   Sexual activity: Not Currently  Other Topics Concern   Not on file  Social History Narrative   Retired as an Cytogeneticist   2 children   1 grandson    Social Determinants of Radio broadcast assistant Strain: Not on file  Food Insecurity: Not on file  Transportation Needs: Not on file  Physical Activity: Not on file  Stress: Not on file  Social Connections: Not on file  Intimate Partner Violence: Not on file    Outpatient Medications Prior to Visit  Medication Sig Dispense Refill   aspirin EC 81 MG tablet Take 1 tablet (81 mg total) by mouth daily.     nebivolol (BYSTOLIC) 10 MG tablet TAKE 1 TABLET(10 MG) BY MOUTH DAILY 60 tablet 5   tamsulosin (FLOMAX) 0.4 MG CAPS capsule TAKE 1 CAPSULE(0.4 MG) BY MOUTH DAILY 90 capsule 1   FLUoxetine (PROZAC) 20 MG capsule Take 1 capsule (20 mg total) by mouth daily. 30 capsule 3   triamcinolone (KENALOG) 0.1 % APPLY EXTERNALLY TO THE AFFECTED AREA TWICE DAILY 80 g 1   No facility-administered medications prior to visit.    Allergies  Allergen Reactions   Hctz [Hydrochlorothiazide] Rash   Bactrim [Sulfamethoxazole-Trimethoprim] Rash   Losartan Palpitations    Palpitations and fever    Review of Systems  Constitutional:  Negative for fever.       (-)unexpected weight change (-)Adenopathy  HENT:  Negative for congestion, sinus pain and sore throat.   Eyes:        (-)Visual disturbance  Respiratory:  Negative for cough, shortness of breath and wheezing.   Cardiovascular:  Negative for chest pain, palpitations and leg swelling.  Gastrointestinal:  Negative for blood in stool, constipation, diarrhea, nausea and vomiting.  Genitourinary:  Negative for dysuria, frequency and hematuria.  Musculoskeletal:        (-)new muscle pain (-)new joint pain  Skin:        (-)new moles  Neurological:  Negative for dizziness and headaches.  Psychiatric/Behavioral:  Negative for depression. The patient is not nervous/anxious.        Objective:    Physical Exam Constitutional:      General: He is not in acute distress.    Appearance: Normal appearance. He is not ill-appearing.  HENT:      Head: Normocephalic and atraumatic.     Right Ear: Tympanic membrane, ear canal and external ear normal.     Left Ear: Tympanic membrane, ear canal and external ear normal.  Eyes:     Extraocular Movements: Extraocular movements intact.     Right eye: No nystagmus.     Left eye: No nystagmus.     Pupils: Pupils are equal, round, and reactive to light.  Cardiovascular:     Rate and Rhythm: Normal rate and regular rhythm.     Heart sounds: Normal heart sounds. No murmur heard.    No gallop.     Comments: Blood pressure measured 215/70 during manual recheck Pulmonary:     Effort: Pulmonary effort is normal.  No respiratory distress.     Breath sounds: Normal breath sounds. No wheezing or rales.  Abdominal:     General: Bowel sounds are normal. There is no distension.     Palpations: Abdomen is soft.     Tenderness: There is no abdominal tenderness. There is no guarding.  Musculoskeletal:     Comments: 5/5 strength in both upper and lower extremities  Skin:    General: Skin is warm and dry.  Neurological:     Mental Status: He is alert and oriented to person, place, and time.     Deep Tendon Reflexes:     Reflex Scores:      Patellar reflexes are 2+ on the right side and 2+ on the left side. Psychiatric:        Judgment: Judgment normal.     BP (!) 215/75   Pulse (!) 58   Temp 97.8 F (36.6 C) (Oral)   Resp 16   Ht '5\' 2"'$  (1.575 m)   Wt 136 lb 12.8 oz (62.1 kg)   SpO2 100%   BMI 25.02 kg/m  Wt Readings from Last 3 Encounters:  06/09/22 136 lb 12.8 oz (62.1 kg)  07/14/21 141 lb (64 kg)  03/31/21 141 lb 9.6 oz (64.2 kg)       Assessment & Plan:   Problem List Items Addressed This Visit       Unprioritized   Prostate cancer [facility])    Daughter states that pt is being followed with serial PSA's by Dr. Jeffie Pollock and last visit PSA was down some. They see him every 6 months.       Preventative health care - Primary    Continue healthy diet and exercise. Pt declines flu  shot but is agreeable to prevnar 20 and daughter plans to have him complete shingrix series and receive updated covid booster at their pharmacy.      Benign essential HTN    BP Readings from Last 3 Encounters:  06/09/22 (!) 215/75  07/14/21 (!) 200/83  03/31/21 (!) 142/82  Uncontrolled. Has a long standing hx of extremely high blood pressure. Home readings 347-425 systolic per daughter. Add amlodipine '5mg'$  once daily.Continue bystolic Start amlodipine today.       Relevant Medications   amLODipine (NORVASC) 5 MG tablet   Other Relevant Orders   TSH   CBC with Differential/Platelet   Anxiety and depression    Improved on fluoxetine. Continue same.        Relevant Medications   FLUoxetine (PROZAC) 20 MG capsule   Other Visit Diagnoses     Need for pneumococcal 20-valent conjugate vaccination       Relevant Orders   Pneumococcal conjugate vaccine 20-valent (Prevnar 20) (Completed)        Meds ordered this encounter  Medications   FLUoxetine (PROZAC) 20 MG capsule    Sig: Take 1 capsule (20 mg total) by mouth daily.    Dispense:  30 capsule    Refill:  3   triamcinolone cream (KENALOG) 0.1 %    Sig: APPLY EXTERNALLY TO THE AFFECTED AREA TWICE DAILY    Dispense:  80 g    Refill:  1   amLODipine (NORVASC) 5 MG tablet    Sig: Take 1 tablet (5 mg total) by mouth daily.    Dispense:  30 tablet    Refill:  3    Order Specific Question:   Supervising Provider    Answer:   [name] A [[id number]]  I, Nance Pear, NP, personally preformed the services described in this documentation.  All medical record entries made by the scribe were at my direction and in my presence.  I have reviewed the chart and discharge instructions (if applicable) and agree that the record reflects my personal performance and is accurate and complete. 06/09/2022   I,Shehryar Baig,acting as a Education administrator for Nance Pear, NP.,have documented all relevant documentation on the behalf of  Nance Pear, NP,as directed by  Nance Pear, NP while in the presence of Nance Pear, NP.   Nance Pear, NP

## 2022-06-09 NOTE — Assessment & Plan Note (Addendum)
BP Readings from Last 3 Encounters:  06/09/22 (!) 215/75  07/14/21 (!) 200/83  03/31/21 (!) 142/82   Uncontrolled. Has a long standing hx of extremely high blood pressure. Home readings 720-721 systolic per daughter. Add amlodipine '5mg'$  once daily.Continue bystolic Start amlodipine today.

## 2022-06-09 NOTE — Assessment & Plan Note (Signed)
Continue healthy diet and exercise. Pt declines flu shot but is agreeable to prevnar 20 and daughter plans to have him complete shingrix series and receive updated covid booster at their pharmacy.

## 2022-06-09 NOTE — Assessment & Plan Note (Signed)
Daughter states that pt is being followed with serial PSA's by Dr. Jeffie Pollock and last visit PSA was down some. They see him every 6 months.

## 2022-06-09 NOTE — Assessment & Plan Note (Signed)
Improved on fluoxetine. Continue same.

## 2022-06-23 ENCOUNTER — Ambulatory Visit (INDEPENDENT_AMBULATORY_CARE_PROVIDER_SITE_OTHER): Payer: Medicare Other | Admitting: Family

## 2022-06-23 VITALS — BP 187/57 | HR 57 | Temp 97.9°F | Resp 16 | Wt 138.0 lb

## 2022-06-23 DIAGNOSIS — I10 Essential (primary) hypertension: Secondary | ICD-10-CM | POA: Diagnosis not present

## 2022-06-23 DIAGNOSIS — C61 Malignant neoplasm of prostate: Secondary | ICD-10-CM | POA: Diagnosis not present

## 2022-06-23 MED ORDER — AMLODIPINE BESYLATE 10 MG PO TABS: 10.0000 mg | ORAL_TABLET | Freq: Every day | ORAL | 0 refills | Status: DC

## 2022-06-23 NOTE — Assessment & Plan Note (Signed)
Improved but still elevated. Will increase amlodipine to '10mg'$  once daily. Follow back up in 2 weeks.  BP Readings from Last 3 Encounters:  06/23/22 (!) 187/57  06/09/22 (!) 215/75  07/14/21 (!) 200/83

## 2022-06-23 NOTE — Progress Notes (Signed)
Subjective:   By signing my name below, I, [name], attest that this documentation has been prepared under the direction and in the presence of Karie Chimera, NP 06/23/2022     Patient ID: Steven Morrison, male    DOB: 1928-04-12, 88 y.o.   MRN: [id number]  Chief Complaint  Patient presents with   Hypertension    Here for follow up    HPI Patient is in today for an office visit. He is accompanied by his daughter.  Blood Pressure: Since last visit, he has started 5 mg of Amlodipine. His blood pressure is decreasing but not at goal. He reports that overall he feels fine. He denies of any headaches or swelling in his lower extremities. His blood pressure upon recheck was 180/60. He has a blood pressure monitor at home.  BP Readings from Last 3 Encounters:  06/23/22 (!) 187/57  06/09/22 (!) 215/75  07/14/21 (!) 200/83   Pulse Readings from Last 3 Encounters:  06/23/22 (!) 57  06/09/22 (!) 58  07/14/21 (!) 58   Urinary Frequency: He reports that his urinary frequency is persistent. He gets up from bed about 1-3 times a night. His urine stream is strong but then decreases. He feels as though he emptied his bladder after urinating but later feels as though he has to use the restroom again.  He reports that he had an appointment with his urologist, Dr. Jeffie Pollock, in September. He is currently taking 0.4 mg of Flomax. He discontinues drinking around 20:00 in the evening.   Daily Life: His daughter reports that as he's getting older, he is lacking structure in his daily life. She is trying to keep a consistent routine for him. Her biggest concern is that he is not drinking enough water. She did not realize his urinary frequency is increasing. She reports that he will be seeing Dr.Wrenn in a couple of months.   Health Maintenance Due  Topic Date Due   Zoster Vaccines- Shingrix (1 of 2) Never done   Medicare Annual Wellness (AWV)  04/25/2020   COVID-19 Vaccine (5 - Mixed  Product risk series) 04/02/2021   TETANUS/TDAP  09/09/2021    Past Medical History:  Diagnosis Date   Anxiety state, unspecified 12/19/2013   Benign essential HTN 09/12/2011   Does not tolerate thiazide diuretics developed very bad dermatitis    Constipation 02/10/2012   Dermatitis 09/13/2012   Elevated PSA 11/03/2011   HTN (hypertension) 09/12/2011   Hyperlipidemia, mild 04/17/2015   Intertrigo 03/20/2012   Medicare annual wellness visit, subsequent 09/12/2011   Rash, skin 01/02/2012   Left lower leg   Restless sleeper 01/04/2017   Thrombocytopenia ([place]) 11/17/2011   slight   Uncontrolled hypertension 09/12/2011   Urinary hesitancy 10/12/2011   Urinary urgency 10/12/2011    No past surgical history on file.  Family History  Problem Relation Age of Onset   Migraines Daughter    Allergies Daughter    Allergies Son     Social History   Socioeconomic History   Marital status: Divorced    Spouse name: Not on file   Number of children: Not on file   Years of education: Not on file   Highest education level: Not on file  Occupational History   Not on file  Tobacco Use   Smoking status: Never   Smokeless tobacco: Never  Substance and Sexual Activity   Alcohol use: Yes    Comment: occasionally   Drug use: No  Sexual activity: Not Currently  Other Topics Concern   Not on file  Social History Narrative   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     FLUoxetine (PROZAC) 20 MG capsule Take 1 capsule (20 mg total) by mouth daily. 30 capsule 3   nebivolol  (BYSTOLIC) 10 MG tablet TAKE 1 TABLET(10 MG) BY MOUTH DAILY 60 tablet 5   tamsulosin (FLOMAX) 0.4 MG CAPS capsule TAKE 1 CAPSULE(0.4 MG) BY MOUTH DAILY 90 capsule 1   triamcinolone cream (KENALOG) 0.1 % APPLY EXTERNALLY TO THE AFFECTED AREA TWICE DAILY 80 g 1   amLODipine (NORVASC) 5 MG tablet Take 1 tablet (5 mg total) by mouth daily. 30 tablet 3   No facility-administered medications prior to visit.    Allergies  Allergen Reactions   Hctz [Hydrochlorothiazide] Rash   Bactrim [Sulfamethoxazole-Trimethoprim] Rash   Losartan Palpitations    Palpitations and fever    ROS     Objective:    Physical Exam Constitutional:      General: He is not in acute distress.    Appearance: Normal appearance. He is not ill-appearing.  HENT:     Head: Normocephalic and atraumatic.     Right Ear: External ear normal.     Left Ear: External ear normal.  Eyes:     Extraocular Movements: Extraocular movements intact.     Pupils: Pupils are equal, round, and reactive to light.  Cardiovascular:     Rate and Rhythm: Normal rate.  Pulmonary:     Effort: Pulmonary effort is normal.  Skin:    General: Skin is warm and dry.  Neurological:     Mental Status: He is alert and oriented to person, place, and time.  Psychiatric:        Mood and Affect: Mood normal.        Behavior: Behavior normal.        Judgment: Judgment normal.     BP (!) 187/57 (BP Location: Right Arm, Patient Position: Sitting, Cuff Size: Small)   Pulse (!) 57   Temp 97.9 F (36.6 C) (Oral)   Resp 16   Wt 138 lb (62.6 kg)   SpO2 100%   BMI 25.24 kg/m  Wt Readings from Last 3 Encounters:  06/23/22 138 lb (62.6 kg)  06/09/22 136 lb 12.8 oz (62.1 kg)  07/14/21 141 lb (64 kg)       Assessment & Plan:   Problem List Items Addressed This Visit       Unprioritized   Prostate cancer [facility])    He is followed by Dr. Jeffie Pollock, c/o nocturia x 2-3 at night. He continues flomax.  Advised pt and daughter to address this concern  with Urology.       Benign essential HTN - Primary    Improved but still elevated. Will increase amlodipine to '10mg'$  once daily. Follow back up in 2 weeks.  BP Readings from Last 3 Encounters:  06/23/22 (!) 187/57  06/09/22 (!) 215/75  07/14/21 (!) 200/83  Relevant Medications   amLODipine (NORVASC) 10 MG tablet   Meds ordered this encounter  Medications   amLODipine (NORVASC) 10 MG tablet    Sig: Take 1 tablet (10 mg total) by mouth daily.    Dispense:  90 tablet    Refill:  0    Order Specific Question:   Supervising Provider    Answer:   [name] A [[id number]]    I, Nance Pear, NP, personally preformed the services described in this documentation.  All medical record entries made by the scribe were at my direction and in my presence.  I have reviewed the chart and discharge instructions (if applicable) and agree that the record reflects my personal performance and is accurate and complete. 06/23/2022   I,Amber Collins,acting as a scribe for Nance Pear, NP.,have documented all relevant documentation on the behalf of Nance Pear, NP,as directed by  Nance Pear, NP while in the presence of Nance Pear, NP.    Nance Pear, NP

## 2022-06-23 NOTE — Assessment & Plan Note (Signed)
He is followed by Dr. Jeffie Pollock, c/o nocturia x 2-3 at night. He continues flomax.  Advised pt and daughter to address this concern with Urology.

## 2022-07-05 ENCOUNTER — Ambulatory Visit (INDEPENDENT_AMBULATORY_CARE_PROVIDER_SITE_OTHER): Payer: Medicare Other | Admitting: Family

## 2022-07-05 VITALS — BP 148/50 | HR 59 | Temp 97.7°F | Resp 16 | Wt 138.0 lb

## 2022-07-05 DIAGNOSIS — I10 Essential (primary) hypertension: Secondary | ICD-10-CM | POA: Diagnosis not present

## 2022-07-05 MED ORDER — AMLODIPINE BESYLATE 10 MG PO TABS: 10.0000 mg | ORAL_TABLET | Freq: Every day | ORAL | 0 refills | Status: DC

## 2022-07-05 NOTE — Progress Notes (Signed)
Subjective:   By signing my name below, I, Steven Morrison, attest that this documentation has been prepared under the direction and in the presence of Debbrah Alar, 07/05/2022.   Patient ID: Steven Morrison, male    DOB: 1927/08/26, 88 y.o.   MRN: [id number]  Chief Complaint  Patient presents with   Hypertension    Here for follow up    HPI Patient is in today for an office visit.  Hypertension Patient reports that he is complaint and tolerating his 10 mg Norvasc medication. He states that he regularly checks his blood pressure at home and receives readings around 127-147/48-65. His blood pressure is high this visit. BP Readings from Last 3 Encounters:  07/05/22 (!) 148/50  06/23/22 (!) 187/57  06/09/22 (!) 215/75     Health Maintenance Due  Topic Date Due   Zoster Vaccines- Shingrix (1 of 2) Never done   [employer] Annual Wellness (AWV)  04/25/2020   COVID-19 Vaccine (5 - Mixed Product risk series) 04/02/2021    Past Medical History:  Diagnosis Date   Anxiety state, unspecified 12/19/2013   Benign essential HTN 09/12/2011   Does not tolerate thiazide diuretics developed very bad dermatitis    Constipation 02/10/2012   Dermatitis 09/13/2012   Elevated PSA 11/03/2011   HTN (hypertension) 09/12/2011   Hyperlipidemia, mild 04/17/2015   Intertrigo 03/20/2012   Medicare annual wellness visit, subsequent 09/12/2011   Rash, skin 01/02/2012   Left lower leg   Restless sleeper 01/04/2017   Thrombocytopenia ([place]) 11/17/2011   slight   Uncontrolled hypertension 09/12/2011   Urinary hesitancy 10/12/2011   Urinary urgency 10/12/2011    No past surgical history on file.  Family History  Problem Relation Age of Onset   Migraines Daughter    Allergies Daughter    Allergies Son     Social History   Socioeconomic History   Marital status: Divorced    Spouse name: Not on file   Number of children: Not on file   Years of education: Not on file   Highest education level: Not on  file  Occupational History   Not on file  Tobacco Use   Smoking status: Never   Smokeless tobacco: Never  Substance and Sexual Activity   Alcohol use: Yes    Comment: occasionally   Drug use: No   Sexual activity: Not Currently  Other Topics Concern   Not on file  Social History Narrative   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     FLUoxetine (PROZAC) 20 MG capsule Take 1 capsule (20 mg total) by mouth daily. 30 capsule 3   nebivolol (BYSTOLIC) 10 MG tablet TAKE 1 TABLET(10 MG) BY MOUTH DAILY 60 tablet 5   tamsulosin (FLOMAX) 0.4 MG CAPS capsule TAKE 1 CAPSULE(0.4 MG) BY MOUTH DAILY 90 capsule 1   triamcinolone cream (KENALOG) 0.1 % APPLY EXTERNALLY TO THE AFFECTED AREA TWICE DAILY 80 g 1   amLODipine (NORVASC) 10 MG tablet Take 1 tablet (10 mg total)  by mouth daily. 90 tablet 0   No facility-administered medications prior to visit.    Allergies  Allergen Reactions   Hctz [Hydrochlorothiazide] Rash   Bactrim [Sulfamethoxazole-Trimethoprim] Rash   Losartan Palpitations    Palpitations and fever    ROS    See HPI Objective:    Physical Exam Constitutional:      General: He is not in acute distress.    Appearance: Normal appearance. He is not ill-appearing.  HENT:     Head: Normocephalic and atraumatic.     Right Ear: External ear normal.     Left Ear: External ear normal.  Eyes:     Extraocular Movements: Extraocular movements intact.     Pupils: Pupils are equal, round, and reactive to light.  Cardiovascular:     Rate and Rhythm: Normal  rate and regular rhythm.     Heart sounds: Normal heart sounds. No murmur heard.    No gallop.  Pulmonary:     Effort: Pulmonary effort is normal. No respiratory distress.     Breath sounds: Normal breath sounds. No wheezing or rales.  Skin:    General: Skin is warm and dry.  Neurological:     Mental Status: He is alert and oriented to person, place, and time.  Psychiatric:        Mood and Affect: Mood normal.        Behavior: Behavior normal.        Judgment: Judgment normal.     BP (!) 148/50   Pulse (!) 59   Temp 97.7 F (36.5 C) (Oral)   Resp 16   Wt 138 lb (62.6 kg)   SpO2 100%   BMI 25.24 kg/m  Wt Readings from Last 3 Encounters:  07/05/22 138 lb (62.6 kg)  06/23/22 138 lb (62.6 kg)  06/09/22 136 lb 12.8 oz (62.1 kg)       Assessment & Plan:   Problem List Items Addressed This Visit       Unprioritized   Benign essential HTN - Primary    BP Readings from Last 3 Encounters:  07/05/22 (!) 148/50  06/23/22 (!) 187/57  06/09/22 (!) 215/75  BP is significantly improved. Home readings are even better. Tolerating amlodipine '10mg'$  once daily.  Continue along with bystolic.       Relevant Medications   amLODipine (NORVASC) 10 MG tablet   Meds ordered this encounter  Medications   amLODipine (NORVASC) 10 MG tablet    Sig: Take 1 tablet (10 mg total) by mouth daily.    Dispense:  90 tablet    Refill:  0    Order Specific Question:   Supervising Provider    Answer:   [name] A [[id number]]    I, Debbrah Alar, personally preformed the services described in this documentation.  All medical record entries made by the scribe were at my direction and in my presence.  I have reviewed the chart and discharge instructions (if applicable) and agree that the record reflects my personal performance and is accurate and complete. 07/05/2022.   I,Verona Buck,acting as a Education administrator for [employer], NP.,have documented all relevant documentation on the behalf of  Nance Pear, NP,as directed by  Nance Pear, NP while in the presence of Nance Pear, NP.    Nance Pear, NP

## 2022-07-05 NOTE — Assessment & Plan Note (Signed)
BP Readings from Last 3 Encounters:  07/05/22 (!) 148/50  06/23/22 (!) 187/57  06/09/22 (!) 215/75   BP is significantly improved. Home readings are even better. Tolerating amlodipine '10mg'$  once daily.  Continue along with bystolic.

## 2022-07-27 DIAGNOSIS — H2512 Age-related nuclear cataract, left eye: Secondary | ICD-10-CM | POA: Diagnosis not present

## 2022-07-27 DIAGNOSIS — H18413 Arcus senilis, bilateral: Secondary | ICD-10-CM | POA: Diagnosis not present

## 2022-07-27 DIAGNOSIS — H4423 Degenerative myopia, bilateral: Secondary | ICD-10-CM | POA: Diagnosis not present

## 2022-07-27 DIAGNOSIS — H2513 Age-related nuclear cataract, bilateral: Secondary | ICD-10-CM | POA: Diagnosis not present

## 2022-07-27 DIAGNOSIS — H25043 Posterior subcapsular polar age-related cataract, bilateral: Secondary | ICD-10-CM | POA: Diagnosis not present

## 2022-07-27 DIAGNOSIS — H25013 Cortical age-related cataract, bilateral: Secondary | ICD-10-CM | POA: Diagnosis not present

## 2022-09-28 ENCOUNTER — Other Ambulatory Visit: Payer: Self-pay | Admitting: Family

## 2022-10-03 NOTE — Assessment & Plan Note (Signed)
Well controlled, no changes to meds. Encouraged heart healthy diet such as the DASH diet and exercise as tolerated.  

## 2022-10-03 NOTE — Assessment & Plan Note (Signed)
hgba1c acceptable, minimize simple carbs. Increase exercise as tolerated.  

## 2022-10-03 NOTE — Assessment & Plan Note (Signed)
Encourage heart healthy diet such as MIND or DASH diet, increase exercise, avoid trans fats, simple carbohydrates and processed foods, consider a krill or fish or flaxseed oil cap daily.  °

## 2022-10-04 ENCOUNTER — Ambulatory Visit (INDEPENDENT_AMBULATORY_CARE_PROVIDER_SITE_OTHER): Payer: Medicare Other | Admitting: Family Medicine

## 2022-10-04 VITALS — BP 130/62 | HR 56 | Temp 97.5°F | Resp 16 | Ht 62.0 in | Wt 138.6 lb

## 2022-10-04 DIAGNOSIS — I10 Essential (primary) hypertension: Secondary | ICD-10-CM

## 2022-10-04 DIAGNOSIS — N289 Disorder of kidney and ureter, unspecified: Secondary | ICD-10-CM

## 2022-10-04 DIAGNOSIS — R739 Hyperglycemia, unspecified: Secondary | ICD-10-CM

## 2022-10-04 DIAGNOSIS — R251 Tremor, unspecified: Secondary | ICD-10-CM

## 2022-10-04 DIAGNOSIS — E785 Hyperlipidemia, unspecified: Secondary | ICD-10-CM | POA: Diagnosis not present

## 2022-10-04 NOTE — Patient Instructions (Addendum)
Magnesium Glycinate 200 to 400 mg nightly  L Tryptophan capsules daily  Tetanus shot is due at pharmacy  Respiratory Syncitial Virus vaccine, Arexvy at pharmacy  Shingrix is the new shingles shot, 2 shots over 2-6 months, confirm coverage with insurance and document, then can return here for shots with nurse appt or at pharmacy

## 2022-10-05 LAB — CBC WITH DIFFERENTIAL/PLATELET
Basophils Absolute: 0.1 10*3/uL (ref 0.0–0.1)
Basophils Relative: 1.3 % (ref 0.0–3.0)
Eosinophils Absolute: 0.2 10*3/uL (ref 0.0–0.7)
Eosinophils Relative: 2.9 % (ref 0.0–5.0)
HCT: 37.3 % — ABNORMAL LOW (ref 39.0–52.0)
Hemoglobin: 12.4 g/dL — ABNORMAL LOW (ref 13.0–17.0)
Lymphocytes Relative: 36.1 % (ref 12.0–46.0)
Lymphs Abs: 2.2 10*3/uL (ref 0.7–4.0)
MCHC: 33.3 g/dL (ref 30.0–36.0)
MCV: 84.2 fl (ref 78.0–100.0)
Monocytes Absolute: 0.6 10*3/uL (ref 0.1–1.0)
Monocytes Relative: 9.2 % (ref 3.0–12.0)
Neutro Abs: 3.1 10*3/uL (ref 1.4–7.7)
Neutrophils Relative %: 50.5 % (ref 43.0–77.0)
Platelets: 202 10*3/uL (ref 150.0–400.0)
RBC: 4.43 Mil/uL (ref 4.22–5.81)
RDW: 15 % (ref 11.5–15.5)
WBC: 6.1 10*3/uL (ref 4.0–10.5)

## 2022-10-05 LAB — LIPID PANEL
Cholesterol: 179 mg/dL (ref 0–200)
HDL: 40 mg/dL (ref >39.00)
LDL Cholesterol: 104 mg/dL — ABNORMAL HIGH (ref 0–99)
NonHDL: 139.3
Total CHOL/HDL Ratio: 4
Triglycerides: 178 mg/dL — ABNORMAL HIGH (ref 0.0–149.0)
VLDL: 35.6 mg/dL (ref 0.0–40.0)

## 2022-10-05 LAB — HEMOGLOBIN A1C: Hgb A1c MFr Bld: 6.3 % (ref 4.6–6.5)

## 2022-10-05 LAB — COMPREHENSIVE METABOLIC PANEL
ALT: 14 U/L (ref 0–53)
AST: 18 U/L (ref 0–37)
Albumin: 4.2 g/dL (ref 3.5–5.2)
Alkaline Phosphatase: 54 U/L (ref 39–117)
BUN: 15 mg/dL (ref 6–23)
CO2: 28 mEq/L (ref 19–32)
Calcium: 9.3 mg/dL (ref 8.4–10.5)
Chloride: 101 mEq/L (ref 96–112)
Creatinine, Ser: 1.34 mg/dL (ref 0.40–1.50)
GFR: 45.97 mL/min — ABNORMAL LOW (ref >60.00)
Glucose, Bld: 90 mg/dL (ref 70–99)
Potassium: 4.5 mEq/L (ref 3.5–5.1)
Sodium: 138 mEq/L (ref 135–145)
Total Bilirubin: 0.5 mg/dL (ref 0.2–1.2)
Total Protein: 7.1 g/dL (ref 6.0–8.3)

## 2022-10-05 LAB — TSH: TSH: 2.97 u[IU]/mL (ref 0.35–5.50)

## 2022-10-06 ENCOUNTER — Other Ambulatory Visit: Payer: Self-pay

## 2022-10-06 DIAGNOSIS — I10 Essential (primary) hypertension: Secondary | ICD-10-CM

## 2022-10-06 DIAGNOSIS — D619 Aplastic anemia, unspecified: Secondary | ICD-10-CM

## 2022-10-06 NOTE — Addendum Note (Signed)
Addended by: Laure Kidney on: 10/06/2022 09:45 AM   Modules accepted: Orders

## 2022-10-10 DIAGNOSIS — R251 Tremor, unspecified: Secondary | ICD-10-CM | POA: Insufficient documentation

## 2022-10-10 NOTE — Progress Notes (Signed)
Subjective:    Patient ID: Steven Morrison, male    DOB: 1927-10-27, 89 y.o.   MRN: [id number]  Chief Complaint  Patient presents with   Follow-up    Follow up    HPI Patient is in today for follow up on chronic medical concerns. No recent febrile illness or hospitalizations. He is accompanied by his daughter. He is noting getting up as many as 3 x a night to urinate but denies any denies any burning, flank pain, fevers. They note a tremor in both hands that is not limiting his activities. Denies CP/palp/SOB/HA/congestion/fevers/GI or GU c/o. Taking meds as prescribed   Past Medical History:  Diagnosis Date   Anxiety state, unspecified 12/19/2013   Benign essential HTN 09/12/2011   Does not tolerate thiazide diuretics developed very bad dermatitis    Constipation 02/10/2012   Dermatitis 09/13/2012   Elevated PSA 11/03/2011   HTN (hypertension) 09/12/2011   Hyperlipidemia, mild 04/17/2015   Intertrigo 03/20/2012   Medicare annual wellness visit, subsequent 09/12/2011   Rash, skin 01/02/2012   Left lower leg   Restless sleeper 01/04/2017   Thrombocytopenia ([place]) 11/17/2011   slight   Uncontrolled hypertension 09/12/2011   Urinary hesitancy 10/12/2011   Urinary urgency 10/12/2011    No past surgical history on file.  Family History  Problem Relation Age of Onset   Migraines Daughter    Allergies Daughter    Allergies Son     Social History   Socioeconomic History   Marital status: Divorced    Spouse name: Not on file   Number of children: Not on file   Years of education: Not on file   Highest education level: Not on file  Occupational History   Not on file  Tobacco Use   Smoking status: Never   Smokeless tobacco: Never  Substance and Sexual Activity   Alcohol use: Yes    Comment: occasionally   Drug use: No   Sexual activity: Not Currently  Other Topics Concern   Not on file  Social History Narrative   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 90 tablet 0   aspirin EC 81 MG tablet Take 1 tablet (81 mg total) by mouth daily.     FLUoxetine (PROZAC) 20 MG capsule TAKE 1 CAPSULE(20 MG) BY MOUTH DAILY 30 capsule 3   nebivolol (BYSTOLIC) 10 MG tablet TAKE 1 TABLET(10 MG) BY MOUTH DAILY 60 tablet 5   tamsulosin (FLOMAX) 0.4 MG CAPS capsule TAKE 1 CAPSULE(0.4 MG) BY MOUTH DAILY 90 capsule 1   triamcinolone cream (KENALOG) 0.1 % APPLY EXTERNALLY TO THE AFFECTED AREA TWICE DAILY 80 g 1   No facility-administered medications prior to visit.    Allergies  Allergen Reactions   Hctz [Hydrochlorothiazide] Rash   Bactrim [Sulfamethoxazole-Trimethoprim] Rash   Losartan Palpitations    Palpitations and fever    Review of Systems  Constitutional:  Negative for fever and malaise/fatigue.  HENT:  Negative for congestion.   Eyes:  Negative for blurred  vision.  Respiratory:  Negative for shortness of breath.   Cardiovascular:  Negative for chest pain, palpitations and leg swelling.  Gastrointestinal:  Negative for abdominal pain, blood in stool and nausea.  Genitourinary:  Positive for frequency. Negative for dysuria, flank pain, hematuria and urgency.  Musculoskeletal:  Negative for falls.  Skin:  Negative for rash.  Neurological:  Positive for tremors. Negative for dizziness, loss of consciousness and headaches.  Endo/Heme/Allergies:  Negative for environmental allergies.  Psychiatric/Behavioral:  Negative for depression. The patient is not nervous/anxious.        Objective:    Physical Exam Constitutional:       General: He is not in acute distress.    Appearance: Normal appearance. He is not ill-appearing or toxic-appearing.  HENT:     Head: Normocephalic and atraumatic.     Right Ear: External ear normal.     Left Ear: External ear normal.     Nose: Nose normal.  Eyes:     General:        Right eye: No discharge.        Left eye: No discharge.  Cardiovascular:     Rate and Rhythm: Normal rate and regular rhythm.     Pulses: Normal pulses.     Heart sounds: Normal heart sounds. No murmur heard. Pulmonary:     Effort: Pulmonary effort is normal.     Breath sounds: No wheezing.  Abdominal:     General: Abdomen is flat. Bowel sounds are normal.     Palpations: Abdomen is soft.     Tenderness: There is no guarding.  Musculoskeletal:     Cervical back: No rigidity.     Right lower leg: No edema.     Left lower leg: No edema.  Lymphadenopathy:     Cervical: No cervical adenopathy.  Skin:    General: Skin is warm.     Findings: No rash.  Neurological:     Mental Status: He is alert and oriented to person, place, and time.  Psychiatric:        Behavior: Behavior normal.     BP 130/62 (BP Location: Right Arm, Patient Position: Sitting, Cuff Size: Normal)   Pulse (!) 56   Temp (!) 97.5 F (36.4 C) (Oral)   Resp 16   Ht '5\' 2"'$  (1.575 m)   Wt 138 lb 9.6 oz (62.9 kg)   SpO2 97%   BMI 25.35 kg/m  Wt Readings from Last 3 Encounters:  10/04/22 138 lb 9.6 oz (62.9 kg)  07/05/22 138 lb (62.6 kg)  06/23/22 138 lb (62.6 kg)    Diabetic Foot Exam - Simple   No data filed    Lab Results  Component Value Date   WBC 6.1 10/04/2022   HGB 12.4 (L) 10/04/2022   HCT 37.3 (L) 10/04/2022   PLT 202.0 10/04/2022   GLUCOSE 90 10/04/2022   CHOL 179 10/04/2022   TRIG 178.0 (H) 10/04/2022   HDL 40.00 10/04/2022   LDLDIRECT 124.0 09/06/2016   LDLCALC 104 (H) 10/04/2022   ALT 14 10/04/2022   AST 18 10/04/2022   NA 138 10/04/2022   K 4.5 10/04/2022   CL 101 10/04/2022   CREATININE 1.34  10/04/2022   BUN 15 10/04/2022   CO2 28 10/04/2022   TSH 2.97 10/04/2022   PSA 9.37 (H) 03/31/2021   HGBA1C 6.3 10/04/2022    Lab Results  Component Value Date   TSH 2.97 10/04/2022   Lab Results  Component Value  Date   WBC 6.1 10/04/2022   HGB 12.4 (L) 10/04/2022   HCT 37.3 (L) 10/04/2022   MCV 84.2 10/04/2022   PLT 202.0 10/04/2022   Lab Results  Component Value Date   NA 138 10/04/2022   K 4.5 10/04/2022   CO2 28 10/04/2022   GLUCOSE 90 10/04/2022   BUN 15 10/04/2022   CREATININE 1.34 10/04/2022   BILITOT 0.5 10/04/2022   ALKPHOS 54 10/04/2022   AST 18 10/04/2022   ALT 14 10/04/2022   PROT 7.1 10/04/2022   ALBUMIN 4.2 10/04/2022   CALCIUM 9.3 10/04/2022   ANIONGAP 9 03/04/2018   GFR 45.97 (L) 10/04/2022   Lab Results  Component Value Date   CHOL 179 10/04/2022   Lab Results  Component Value Date   HDL 40.00 10/04/2022   Lab Results  Component Value Date   LDLCALC 104 (H) 10/04/2022   Lab Results  Component Value Date   TRIG 178.0 (H) 10/04/2022   Lab Results  Component Value Date   CHOLHDL 4 10/04/2022   Lab Results  Component Value Date   HGBA1C 6.3 10/04/2022       Assessment & Plan:  Hyperglycemia Assessment & Plan: hgba1c acceptable, minimize simple carbs. Increase exercise as tolerated.   Orders: -     Comprehensive metabolic panel -     Hemoglobin A1c  Benign essential HTN Assessment & Plan: Well controlled, no changes to meds. Encouraged heart healthy diet such as the DASH diet and exercise as tolerated.    Orders: -     CBC with Differential/Platelet -     Comprehensive metabolic panel -     TSH  Hyperlipidemia, mild Assessment & Plan: Encourage heart healthy diet such as MIND or DASH diet, increase exercise, avoid trans fats, simple carbohydrates and processed foods, consider a krill or fish or flaxseed oil cap daily. ;  Orders: -     Lipid panel  Tremor Assessment & Plan: Bilateral hands noted. Does note previous  family members have had the same. It is not affecting daily activities thus far. They will report if it worsens and we can consider referral   Renal insufficiency Assessment & Plan: Hydrate and monitor      [name], MD

## 2022-10-10 NOTE — Assessment & Plan Note (Signed)
Bilateral hands noted. Does note previous family members have had the same. It is not affecting daily activities thus far. They will report if it worsens and we can consider referral

## 2022-10-10 NOTE — Assessment & Plan Note (Signed)
Hydrate and monitor 

## 2022-10-29 DIAGNOSIS — H2512 Age-related nuclear cataract, left eye: Secondary | ICD-10-CM | POA: Diagnosis not present

## 2022-10-29 DIAGNOSIS — H2511 Age-related nuclear cataract, right eye: Secondary | ICD-10-CM | POA: Diagnosis not present

## 2022-11-12 DIAGNOSIS — H2511 Age-related nuclear cataract, right eye: Secondary | ICD-10-CM | POA: Diagnosis not present

## 2022-11-25 ENCOUNTER — Telehealth: Payer: Self-pay | Admitting: Family Medicine

## 2022-11-25 NOTE — Telephone Encounter (Signed)
Copied from CRM [id number]. Topic: Medicare AWV >> Nov 25, 2022  1:09 PM Payton Doughty wrote: Reason for CRM: Called patient to schedule Medicare Annual Wellness Visit (AWV). No voicemail available to leave a message.  Last date of AWV: 04/26/19  Please schedule an appointment at any time with Donne Anon, CMA  .  If any questions, please contact me.  Thank you ,  Verlee Rossetti; Care Guide Ambulatory Clinical Support [place] [facility] Direct Dial: [phone]

## 2022-12-04 ENCOUNTER — Other Ambulatory Visit: Payer: Self-pay | Admitting: Family Medicine

## 2023-02-01 ENCOUNTER — Ambulatory Visit (INDEPENDENT_AMBULATORY_CARE_PROVIDER_SITE_OTHER): Payer: Medicare Other | Admitting: Family Medicine

## 2023-02-01 VITALS — BP 128/72 | HR 54 | Temp 98.0°F | Resp 16 | Ht 63.0 in | Wt 139.4 lb

## 2023-02-01 DIAGNOSIS — E785 Hyperlipidemia, unspecified: Secondary | ICD-10-CM | POA: Diagnosis not present

## 2023-02-01 DIAGNOSIS — R739 Hyperglycemia, unspecified: Secondary | ICD-10-CM

## 2023-02-01 DIAGNOSIS — N289 Disorder of kidney and ureter, unspecified: Secondary | ICD-10-CM | POA: Diagnosis not present

## 2023-02-01 DIAGNOSIS — F32A Depression, unspecified: Secondary | ICD-10-CM | POA: Diagnosis not present

## 2023-02-01 DIAGNOSIS — F419 Anxiety disorder, unspecified: Secondary | ICD-10-CM

## 2023-02-01 DIAGNOSIS — C61 Malignant neoplasm of prostate: Secondary | ICD-10-CM

## 2023-02-01 DIAGNOSIS — I10 Essential (primary) hypertension: Secondary | ICD-10-CM | POA: Diagnosis not present

## 2023-02-01 MED ORDER — FLUOXETINE HCL 20 MG PO CAPS: ORAL_CAPSULE | ORAL | 1 refills | Status: DC

## 2023-02-01 MED ORDER — NEBIVOLOL HCL 10 MG PO TABS: ORAL_TABLET | ORAL | 1 refills | Status: DC

## 2023-02-01 NOTE — Assessment & Plan Note (Signed)
Was seen by [facility], still struggles with getting up 2-3 x times a night and has a decreased stream. They believe the prostate cancer is slow growing and does not need definitive treatment. They are using Beta Prostate over the counter and the Tamulosin. His urgency is some improved.

## 2023-02-01 NOTE — Patient Instructions (Signed)
Hypertension, Adult High blood pressure (hypertension) is when the force of blood pumping through the arteries is too strong. The arteries are the blood vessels that carry blood from the heart throughout the body. Hypertension forces the heart to work harder to pump blood and may cause arteries to become narrow or stiff. Untreated or uncontrolled hypertension can lead to a heart attack, heart failure, a stroke, kidney disease, and other problems. A blood pressure reading consists of a higher number over a lower number. Ideally, your blood pressure should be below 120/80. The first ("top") number is called the systolic pressure. It is a measure of the pressure in your arteries as your heart beats. The second ("bottom") number is called the diastolic pressure. It is a measure of the pressure in your arteries as the heart relaxes. What are the causes? The exact cause of this condition is not known. There are some conditions that result in high blood pressure. What increases the risk? Certain factors may make you more likely to develop high blood pressure. Some of these risk factors are under your control, including: Smoking. Not getting enough exercise or physical activity. Being overweight. Having too much fat, sugar, calories, or salt (sodium) in your diet. Drinking too much alcohol. Other risk factors include: Having a personal history of heart disease, diabetes, high cholesterol, or kidney disease. Stress. Having a family history of high blood pressure and high cholesterol. Having obstructive sleep apnea. Age. The risk increases with age. What are the signs or symptoms? High blood pressure may not cause symptoms. Very high blood pressure (hypertensive crisis) may cause: Headache. Fast or irregular heartbeats (palpitations). Shortness of breath. Nosebleed. Nausea and vomiting. Vision changes. Severe chest pain, dizziness, and seizures. How is this diagnosed? This condition is diagnosed by  measuring your blood pressure while you are seated, with your arm resting on a flat surface, your legs uncrossed, and your feet flat on the floor. The cuff of the blood pressure monitor will be placed directly against the skin of your upper arm at the level of your heart. Blood pressure should be measured at least twice using the same arm. Certain conditions can cause a difference in blood pressure between your right and left arms. If you have a high blood pressure reading during one visit or you have normal blood pressure with other risk factors, you may be asked to: Return on a different day to have your blood pressure checked again. Monitor your blood pressure at home for 1 week or longer. If you are diagnosed with hypertension, you may have other blood or imaging tests to help your health care provider understand your overall risk for other conditions. How is this treated? This condition is treated by making healthy lifestyle changes, such as eating healthy foods, exercising more, and reducing your alcohol intake. You may be referred for counseling on a healthy diet and physical activity. Your health care provider may prescribe medicine if lifestyle changes are not enough to get your blood pressure under control and if: Your systolic blood pressure is above 130. Your diastolic blood pressure is above 80. Your personal target blood pressure may vary depending on your medical conditions, your age, and other factors. Follow these instructions at home: Eating and drinking  Eat a diet that is high in fiber and potassium, and low in sodium, added sugar, and fat. An example of this eating plan is called the DASH diet. DASH stands for Dietary Approaches to Stop Hypertension. To eat this way: Eat   plenty of fresh fruits and vegetables. Try to fill one half of your plate at each meal with fruits and vegetables. Eat whole grains, such as whole-wheat pasta, brown rice, or whole-grain bread. Fill about one  fourth of your plate with whole grains. Eat or drink low-fat dairy products, such as skim milk or low-fat yogurt. Avoid fatty cuts of meat, processed or cured meats, and poultry with skin. Fill about one fourth of your plate with lean proteins, such as fish, chicken without skin, beans, eggs, or tofu. Avoid pre-made and processed foods. These tend to be higher in sodium, added sugar, and fat. Reduce your daily sodium intake. Many people with hypertension should eat less than 1,500 mg of sodium a day. Do not drink alcohol if: Your health care provider tells you not to drink. You are pregnant, may be pregnant, or are planning to become pregnant. If you drink alcohol: Limit how much you have to: 0-1 drink a day for women. 0-2 drinks a day for men. Know how much alcohol is in your drink. In the [place] one drink equals one 12 oz bottle of beer (355 mL), one 5 oz glass of wine (148 mL), or one 1 oz glass of hard liquor (44 mL). Lifestyle  Work with your health care provider to maintain a healthy body weight or to lose weight. Ask what an ideal weight is for you. Get at least 30 minutes of exercise that causes your heart to beat faster (aerobic exercise) most days of the week. Activities may include walking, swimming, or biking. Include exercise to strengthen your muscles (resistance exercise), such as Pilates or lifting weights, as part of your weekly exercise routine. Try to do these types of exercises for 30 minutes at least 3 days a week. Do not use any products that contain nicotine or tobacco. These products include cigarettes, chewing tobacco, and vaping devices, such as e-cigarettes. If you need help quitting, ask your health care provider. Monitor your blood pressure at home as told by your health care provider. Keep all follow-up visits. This is important. Medicines Take over-the-counter and prescription medicines only as told by your health care provider. Follow directions carefully. Blood  pressure medicines must be taken as prescribed. Do not skip doses of blood pressure medicine. Doing this puts you at risk for problems and can make the medicine less effective. Ask your health care provider about side effects or reactions to medicines that you should watch for. Contact a health care provider if you: Think you are having a reaction to a medicine you are taking. Have headaches that keep coming back (recurring). Feel dizzy. Have swelling in your ankles. Have trouble with your vision. Get help right away if you: Develop a severe headache or confusion. Have unusual weakness or numbness. Feel faint. Have severe pain in your chest or abdomen. Vomit repeatedly. Have trouble breathing. These symptoms may be an emergency. Get help right away. Call 911. Do not wait to see if the symptoms will go away. Do not drive yourself to the hospital. Summary Hypertension is when the force of blood pumping through your arteries is too strong. If this condition is not controlled, it may put you at risk for serious complications. Your personal target blood pressure may vary depending on your medical conditions, your age, and other factors. For most people, a normal blood pressure is less than 120/80. Hypertension is treated with lifestyle changes, medicines, or a combination of both. Lifestyle changes include losing weight, eating a healthy,   low-sodium diet, exercising more, and limiting alcohol. This information is not intended to replace advice given to you by your health care provider. Make sure you discuss any questions you have with your health care provider. Document Revised: 06/09/2021 Document Reviewed: 06/09/2021 Elsevier Patient Education  2024 Elsevier Inc.  

## 2023-02-01 NOTE — Assessment & Plan Note (Signed)
Well controlled, no changes to meds. Encouraged heart healthy diet such as the DASH diet and exercise as tolerated.  °

## 2023-02-01 NOTE — Assessment & Plan Note (Signed)
Hydrate and monitor 

## 2023-02-01 NOTE — Assessment & Plan Note (Signed)
Encourage heart healthy diet such as MIND or DASH diet, increase exercise, avoid trans fats, simple carbohydrates and processed foods, consider a krill or fish or flaxseed oil cap daily.  °

## 2023-02-01 NOTE — Assessment & Plan Note (Signed)
Doing well on Fluoxetine no changes.  

## 2023-02-01 NOTE — Progress Notes (Unsigned)
Subjective:    Patient ID: Steven Morrison, male    DOB: [birth year], 89 y.o.   MRN: [id number]  No chief complaint on file.   HPI Discussed the use of AI scribe software for clinical note transcription with the patient, who gave verbal consent to proceed.  History of Present Illness            Past Medical History:  Diagnosis Date  . Anxiety state, unspecified 12/19/2013  . Benign essential HTN 09/12/2011   Does not tolerate thiazide diuretics developed very bad dermatitis   . Constipation 02/10/2012  . Dermatitis 09/13/2012  . Elevated PSA 11/03/2011  . HTN (hypertension) 09/12/2011  . Hyperlipidemia, mild 04/17/2015  . Intertrigo 03/20/2012  . Medicare annual wellness visit, subsequent 09/12/2011  . Rash, skin 01/02/2012   Left lower leg  . Restless sleeper 01/04/2017  . Thrombocytopenia (HCC) 11/17/2011   slight  . Uncontrolled hypertension 09/12/2011  . Urinary hesitancy 10/12/2011  . Urinary urgency 10/12/2011    No past surgical history on file.  Family History  Problem Relation Age of Onset  . Migraines Daughter   . Allergies Daughter   . Allergies Son     Social History   Socioeconomic History  . Marital status: Divorced    Spouse name: Not on file  . Number of children: Not on file  . Years of education: Not on file  . Highest education level: Not on file  Occupational History  . Not on file  Tobacco Use  . Smoking status: Never  . Smokeless tobacco: Never  Substance and Sexual Activity  . Alcohol use: Yes    Comment: occasionally  . Drug use: No  . Sexual activity: Not Currently  Other Topics Concern  . Not on file  Social History Narrative   Retired as an Naval architect   2 children   1 grandson   Social Determinants of Corporate investment banker Strain: Not on [employer] Insecurity: Not on file  Transportation Needs: Not on file  Physical Activity: Not on file  Stress: Not on file  Social Connections: Not on file  Intimate Partner  Violence: Not on file    Outpatient Medications Prior to Visit  Medication Sig Dispense Refill  . amLODipine (NORVASC) 10 MG tablet Take 1 tablet (10 mg total) by mouth daily. 90 tablet 0  . aspirin EC 81 MG tablet Take 1 tablet (81 mg total) by mouth daily.    Marland Kitchen FLUoxetine (PROZAC) 20 MG capsule TAKE 1 CAPSULE(20 MG) BY MOUTH DAILY 30 capsule 3  . nebivolol (BYSTOLIC) 10 MG tablet TAKE 1 QMVHQI(69 MG) BY MOUTH DAILY 60 tablet 5  . tamsulosin (FLOMAX) 0.4 MG CAPS capsule TAKE 1 CAPSULE(0.4 MG) BY MOUTH DAILY 90 capsule 1  . triamcinolone cream (KENALOG) 0.1 % APPLY EXTERNALLY TO THE AFFECTED AREA TWICE DAILY 80 g 1   No facility-administered medications prior to visit.    Allergies  Allergen Reactions  . Hctz [Hydrochlorothiazide] Rash  . Bactrim [Sulfamethoxazole-Trimethoprim] Rash  . Losartan Palpitations    Palpitations and fever    Review of Systems  Constitutional:  Negative for chills, fever and malaise/fatigue.  HENT:  Negative for congestion and hearing loss.   Eyes:  Negative for discharge.  Respiratory:  Negative for cough, sputum production and shortness of breath.   Cardiovascular:  Negative for chest pain, palpitations and leg swelling.  Gastrointestinal:  Negative for abdominal pain, blood in stool, constipation, diarrhea, heartburn, nausea and vomiting.  Genitourinary:  Negative for dysuria, frequency, hematuria and urgency.  Musculoskeletal:  Negative for back pain, falls and myalgias.  Skin:  Negative for rash.  Neurological:  Negative for dizziness, sensory change, loss of consciousness, weakness and headaches.  Endo/Heme/Allergies:  Negative for environmental allergies. Does not bruise/bleed easily.  Psychiatric/Behavioral:  Negative for depression and suicidal ideas. The patient is not nervous/anxious and does not have insomnia.       Objective:    Physical Exam Vitals reviewed.  Constitutional:      Appearance: Normal appearance. He is not  ill-appearing.  HENT:     Head: Normocephalic and atraumatic.     Nose: Nose normal.  Eyes:     Conjunctiva/sclera: Conjunctivae normal.  Cardiovascular:     Rate and Rhythm: Normal rate.     Pulses: Normal pulses.     Heart sounds: Normal heart sounds. No murmur heard. Pulmonary:     Effort: Pulmonary effort is normal.     Breath sounds: Normal breath sounds. No wheezing.  Abdominal:     Palpations: Abdomen is soft. There is no mass.     Tenderness: There is no abdominal tenderness.  Musculoskeletal:     Cervical back: Normal range of motion.     Right lower leg: No edema.     Left lower leg: No edema.  Skin:    General: Skin is warm and dry.  Neurological:     General: No focal deficit present.     Mental Status: He is alert and oriented to person, place, and time.  Psychiatric:        Mood and Affect: Mood normal.   There were no vitals taken for this visit. Wt Readings from Last 3 Encounters:  10/04/22 138 lb 9.6 oz (62.9 kg)  07/05/22 138 lb (62.6 kg)  06/23/22 138 lb (62.6 kg)    Diabetic Foot Exam - Simple   No data filed    Lab Results  Component Value Date   WBC 6.1 10/04/2022   HGB 12.4 (L) 10/04/2022   HCT 37.3 (L) 10/04/2022   PLT 202.0 10/04/2022   GLUCOSE 90 10/04/2022   CHOL 179 10/04/2022   TRIG 178.0 (H) 10/04/2022   HDL 40.00 10/04/2022   LDLDIRECT 124.0 09/06/2016   LDLCALC 104 (H) 10/04/2022   ALT 14 10/04/2022   AST 18 10/04/2022   NA 138 10/04/2022   K 4.5 10/04/2022   CL 101 10/04/2022   CREATININE 1.34 10/04/2022   BUN 15 10/04/2022   CO2 28 10/04/2022   TSH 2.97 10/04/2022   PSA 9.37 (H) 03/31/2021   HGBA1C 6.3 10/04/2022    Lab Results  Component Value Date   TSH 2.97 10/04/2022   Lab Results  Component Value Date   WBC 6.1 10/04/2022   HGB 12.4 (L) 10/04/2022   HCT 37.3 (L) 10/04/2022   MCV 84.2 10/04/2022   PLT 202.0 10/04/2022   Lab Results  Component Value Date   NA 138 10/04/2022   K 4.5 10/04/2022   CO2 28  10/04/2022   GLUCOSE 90 10/04/2022   BUN 15 10/04/2022   CREATININE 1.34 10/04/2022   BILITOT 0.5 10/04/2022   ALKPHOS 54 10/04/2022   AST 18 10/04/2022   ALT 14 10/04/2022   PROT 7.1 10/04/2022   ALBUMIN 4.2 10/04/2022   CALCIUM 9.3 10/04/2022   ANIONGAP 9 03/04/2018   GFR 45.97 (L) 10/04/2022   Lab Results  Component Value Date   CHOL 179 10/04/2022   Lab Results  Component Value Date   HDL 40.00 10/04/2022   Lab Results  Component Value Date   LDLCALC 104 (H) 10/04/2022   Lab Results  Component Value Date   TRIG 178.0 (H) 10/04/2022   Lab Results  Component Value Date   CHOLHDL 4 10/04/2022   Lab Results  Component Value Date   HGBA1C 6.3 10/04/2022       Assessment & Plan:  Hyperglycemia Assessment & Plan: hgba1c acceptable, minimize simple carbs. Increase exercise as tolerated.    Renal insufficiency Assessment & Plan: Hydrate and monitor    Hyperlipidemia, mild Assessment & Plan: Encourage heart healthy diet such as MIND or DASH diet, increase exercise, avoid trans fats, simple carbohydrates and processed foods, consider a krill or fish or flaxseed oil cap daily. ;   Benign essential HTN Assessment & Plan: Well controlled, no changes to meds. Encouraged heart healthy diet such as the DASH diet and exercise as tolerated.       Assessment and Plan              Danise Edge, MD

## 2023-02-01 NOTE — Assessment & Plan Note (Signed)
hgba1c acceptable, minimize simple carbs. Increase exercise as tolerated.  

## 2023-02-02 ENCOUNTER — Encounter: Payer: Self-pay | Admitting: Family Medicine

## 2023-02-02 ENCOUNTER — Other Ambulatory Visit: Payer: Medicare Other

## 2023-02-02 DIAGNOSIS — R739 Hyperglycemia, unspecified: Secondary | ICD-10-CM

## 2023-02-02 LAB — LIPID PANEL
Cholesterol: 183 mg/dL (ref 0–200)
HDL: 31.3 mg/dL — ABNORMAL LOW (ref >39.00)
NonHDL: 152.05
Total CHOL/HDL Ratio: 6
Triglycerides: 283 mg/dL — ABNORMAL HIGH (ref 0.0–149.0)
VLDL: 56.6 mg/dL — ABNORMAL HIGH (ref 0.0–40.0)

## 2023-02-02 LAB — COMPREHENSIVE METABOLIC PANEL
ALT: 17 U/L (ref 0–53)
AST: 21 U/L (ref 0–37)
Albumin: 4.3 g/dL (ref 3.5–5.2)
Alkaline Phosphatase: 51 U/L (ref 39–117)
BUN: 21 mg/dL (ref 6–23)
CO2: 24 mEq/L (ref 19–32)
Calcium: 9.2 mg/dL (ref 8.4–10.5)
Chloride: 103 mEq/L (ref 96–112)
Creatinine, Ser: 1.3 mg/dL (ref 0.40–1.50)
GFR: 47.56 mL/min — ABNORMAL LOW (ref >60.00)
Glucose, Bld: 100 mg/dL — ABNORMAL HIGH (ref 70–99)
Potassium: 4.6 mEq/L (ref 3.5–5.1)
Sodium: 138 mEq/L (ref 135–145)
Total Bilirubin: 0.5 mg/dL (ref 0.2–1.2)
Total Protein: 7.5 g/dL (ref 6.0–8.3)

## 2023-02-02 LAB — CBC WITH DIFFERENTIAL/PLATELET
Basophils Absolute: 0.1 10*3/uL (ref 0.0–0.1)
Basophils Relative: 1.1 % (ref 0.0–3.0)
Eosinophils Absolute: 0.4 10*3/uL (ref 0.0–0.7)
Eosinophils Relative: 5 % (ref 0.0–5.0)
HCT: 40.7 % (ref 39.0–52.0)
Hemoglobin: 13.2 g/dL (ref 13.0–17.0)
Lymphocytes Relative: 36.4 % (ref 12.0–46.0)
Lymphs Abs: 2.6 10*3/uL (ref 0.7–4.0)
MCHC: 32.3 g/dL (ref 30.0–36.0)
MCV: 85.2 fl (ref 78.0–100.0)
Monocytes Absolute: 0.6 10*3/uL (ref 0.1–1.0)
Monocytes Relative: 8.8 % (ref 3.0–12.0)
Neutro Abs: 3.5 10*3/uL (ref 1.4–7.7)
Neutrophils Relative %: 48.7 % (ref 43.0–77.0)
Platelets: 160 10*3/uL (ref 150.0–400.0)
RBC: 4.78 Mil/uL (ref 4.22–5.81)
RDW: 14.1 % (ref 11.5–15.5)
WBC: 7.2 10*3/uL (ref 4.0–10.5)

## 2023-02-02 LAB — LDL CHOLESTEROL, DIRECT: Direct LDL: 112 mg/dL

## 2023-02-02 LAB — TSH: TSH: 1.7 u[IU]/mL (ref 0.35–5.50)

## 2023-02-03 ENCOUNTER — Other Ambulatory Visit: Payer: Self-pay

## 2023-02-03 DIAGNOSIS — E785 Hyperlipidemia, unspecified: Secondary | ICD-10-CM

## 2023-02-03 DIAGNOSIS — I10 Essential (primary) hypertension: Secondary | ICD-10-CM

## 2023-02-03 DIAGNOSIS — R739 Hyperglycemia, unspecified: Secondary | ICD-10-CM

## 2023-02-03 DIAGNOSIS — N289 Disorder of kidney and ureter, unspecified: Secondary | ICD-10-CM

## 2023-02-03 LAB — HEMOGLOBIN A1C
Hgb A1c MFr Bld: 6.2 % of total Hgb — ABNORMAL HIGH (ref <5.7)
Mean Plasma Glucose: 131 mg/dL
eAG (mmol/L): 7.3 mmol/L

## 2023-05-10 ENCOUNTER — Other Ambulatory Visit: Payer: Self-pay

## 2023-05-10 MED ORDER — AMLODIPINE BESYLATE 10 MG PO TABS: 10.0000 mg | ORAL_TABLET | Freq: Every day | ORAL | 1 refills | Status: DC

## 2023-06-03 ENCOUNTER — Other Ambulatory Visit: Payer: Self-pay | Admitting: Family Medicine

## 2023-06-27 ENCOUNTER — Other Ambulatory Visit (INDEPENDENT_AMBULATORY_CARE_PROVIDER_SITE_OTHER): Payer: Medicare Other

## 2023-06-27 DIAGNOSIS — E785 Hyperlipidemia, unspecified: Secondary | ICD-10-CM | POA: Diagnosis not present

## 2023-06-27 DIAGNOSIS — R739 Hyperglycemia, unspecified: Secondary | ICD-10-CM | POA: Diagnosis not present

## 2023-06-27 DIAGNOSIS — N289 Disorder of kidney and ureter, unspecified: Secondary | ICD-10-CM

## 2023-06-27 DIAGNOSIS — I10 Essential (primary) hypertension: Secondary | ICD-10-CM

## 2023-06-27 LAB — COMPREHENSIVE METABOLIC PANEL
ALT: 19 U/L (ref 0–53)
AST: 21 U/L (ref 0–37)
Albumin: 4.3 g/dL (ref 3.5–5.2)
Alkaline Phosphatase: 59 U/L (ref 39–117)
BUN: 15 mg/dL (ref 6–23)
CO2: 28 meq/L (ref 19–32)
Calcium: 9.1 mg/dL (ref 8.4–10.5)
Chloride: 98 meq/L (ref 96–112)
Creatinine, Ser: 1.32 mg/dL (ref 0.40–1.50)
GFR: 46.57 mL/min — ABNORMAL LOW (ref >60.00)
Glucose, Bld: 115 mg/dL — ABNORMAL HIGH (ref 70–99)
Potassium: 4.2 meq/L (ref 3.5–5.1)
Sodium: 135 meq/L (ref 135–145)
Total Bilirubin: 0.5 mg/dL (ref 0.2–1.2)
Total Protein: 7.5 g/dL (ref 6.0–8.3)

## 2023-06-27 LAB — CBC WITH DIFFERENTIAL/PLATELET
Basophils Absolute: 0.1 10*3/uL (ref 0.0–0.1)
Basophils Relative: 1.2 % (ref 0.0–3.0)
Eosinophils Absolute: 0.3 10*3/uL (ref 0.0–0.7)
Eosinophils Relative: 3.8 % (ref 0.0–5.0)
HCT: 39.4 % (ref 39.0–52.0)
Hemoglobin: 12.9 g/dL — ABNORMAL LOW (ref 13.0–17.0)
Lymphocytes Relative: 34.9 % (ref 12.0–46.0)
Lymphs Abs: 2.4 10*3/uL (ref 0.7–4.0)
MCHC: 32.8 g/dL (ref 30.0–36.0)
MCV: 84.6 fL (ref 78.0–100.0)
Monocytes Absolute: 0.7 10*3/uL (ref 0.1–1.0)
Monocytes Relative: 9.7 % (ref 3.0–12.0)
Neutro Abs: 3.5 10*3/uL (ref 1.4–7.7)
Neutrophils Relative %: 50.4 % (ref 43.0–77.0)
Platelets: 209 10*3/uL (ref 150.0–400.0)
RBC: 4.66 Mil/uL (ref 4.22–5.81)
RDW: 15.2 % (ref 11.5–15.5)
WBC: 6.9 10*3/uL (ref 4.0–10.5)

## 2023-06-27 LAB — LIPID PANEL
Cholesterol: 175 mg/dL (ref 0–200)
HDL: 42.9 mg/dL (ref >39.00)
LDL Cholesterol: 101 mg/dL — ABNORMAL HIGH (ref 0–99)
NonHDL: 131.76
Total CHOL/HDL Ratio: 4
Triglycerides: 156 mg/dL — ABNORMAL HIGH (ref 0.0–149.0)
VLDL: 31.2 mg/dL (ref 0.0–40.0)

## 2023-06-27 LAB — TSH: TSH: 2.7 u[IU]/mL (ref 0.35–5.50)

## 2023-06-27 LAB — LDL CHOLESTEROL, DIRECT: Direct LDL: 101 mg/dL

## 2023-06-27 LAB — HEMOGLOBIN A1C: Hgb A1c MFr Bld: 6.4 % (ref 4.6–6.5)

## 2023-06-28 ENCOUNTER — Ambulatory Visit (INDEPENDENT_AMBULATORY_CARE_PROVIDER_SITE_OTHER): Payer: Medicare Other | Admitting: Family

## 2023-06-28 VITALS — BP 130/55 | HR 98 | Temp 97.6°F | Resp 16 | Ht 62.0 in | Wt 142.0 lb

## 2023-06-28 DIAGNOSIS — I10 Essential (primary) hypertension: Secondary | ICD-10-CM | POA: Diagnosis not present

## 2023-06-28 DIAGNOSIS — N289 Disorder of kidney and ureter, unspecified: Secondary | ICD-10-CM

## 2023-06-28 DIAGNOSIS — G4700 Insomnia, unspecified: Secondary | ICD-10-CM

## 2023-06-28 DIAGNOSIS — R972 Elevated prostate specific antigen [PSA]: Secondary | ICD-10-CM

## 2023-06-28 DIAGNOSIS — R739 Hyperglycemia, unspecified: Secondary | ICD-10-CM | POA: Diagnosis not present

## 2023-06-28 DIAGNOSIS — F419 Anxiety disorder, unspecified: Secondary | ICD-10-CM

## 2023-06-28 DIAGNOSIS — C61 Malignant neoplasm of prostate: Secondary | ICD-10-CM

## 2023-06-28 DIAGNOSIS — F32A Depression, unspecified: Secondary | ICD-10-CM

## 2023-06-28 MED ORDER — NEBIVOLOL HCL 10 MG PO TABS: ORAL_TABLET | ORAL | 1 refills | Status: DC

## 2023-06-28 MED ORDER — AMLODIPINE BESYLATE 10 MG PO TABS: 10.0000 mg | ORAL_TABLET | Freq: Every day | ORAL | 1 refills | Status: DC

## 2023-06-28 MED ORDER — AMLODIPINE BESYLATE 10 MG PO TABS: 10.0000 mg | ORAL_TABLET | Freq: Every day | ORAL | 1 refills | Status: AC

## 2023-06-28 NOTE — Assessment & Plan Note (Signed)
Fair mood.  Continues fluoxetine.

## 2023-06-28 NOTE — Patient Instructions (Signed)
VISIT SUMMARY:  Steven Morrison, you came in today for your regular follow-up appointment. You reported no new health concerns but mentioned ongoing issues with urinary frequency at night and a persistent runny nose. Your mood remains stable on Prozac, and you continue to manage your prostate cancer with your urologist.  YOUR PLAN:  -PROSTATE CANCER: Prostate cancer is a type of cancer that occurs in the prostate gland. Your condition is stable, and you should continue your current management with your urologist. The nocturia (frequent urination at night) you are experiencing is likely due to your age and prostate history. Please continue flomax.  -DEPRESSION: Depression is a mood disorder characterized by persistent feelings of sadness and loss of interest. Your mood is stable on your current dose of fluoxetine (Prozac). Please continue taking your medication as prescribed and notify your provider if you notice any changes in your mood or behavior.  -GENERAL HEALTH MAINTENANCE: We discussed the importance of getting the flu vaccination and the COVID-19 booster. You are hesitant about the flu vaccine but are considering the COVID-19 booster based on your family's input. It is important to stay up-to-date with these vaccinations to protect your health.   INSTRUCTIONS:  Please follow up in 6 months.

## 2023-06-28 NOTE — Assessment & Plan Note (Signed)
BP acceptable for his age. Continue bystolic and amlodipine.

## 2023-06-28 NOTE — Assessment & Plan Note (Signed)
Stable except for nocturia.

## 2023-06-28 NOTE — Assessment & Plan Note (Signed)
Lab Results  Component Value Date   HGBA1C 6.4 06/27/2023

## 2023-06-28 NOTE — Assessment & Plan Note (Addendum)
Lab Results  Component Value Date   CREATININE 1.32 06/27/2023   Stable creatinine.

## 2023-06-28 NOTE — Assessment & Plan Note (Deleted)
Continues to follow with flomax.

## 2023-06-28 NOTE — Assessment & Plan Note (Signed)
Continues to follow with urology. Also continues flomax. He still has some nocturia symptoms.

## 2023-06-28 NOTE — Progress Notes (Signed)
Subjective:     Patient ID: Steven Morrison, male    DOB: 1928/04/20, 89 y.o.   MRN: [id number]  Chief Complaint  Patient presents with   Annual Exam    HPI  Discussed the use of AI scribe software for clinical note transcription with the patient, who gave verbal consent to proceed.  History of Present Illness   Mr. Grice, a patient with a history of prostate cancer, presents for a regular follow-up/cpe. He reports no new health concerns. However, he continues to experience urinary frequency, particularly at night, waking up approximately four times. Despite taking Flomax, the issue persists, which he attributes to age. His mood remains stable on Prozac, and he reports no significant changes since childhood. He describes himself as Dispensing optician and often engaged in thought and conversation throughout the day. He also mentions a persistent runny nose, which he has had for a long time.          Health Maintenance Due  Topic Date Due   Zoster Vaccines- Shingrix (1 of 2) Never done   Medicare Annual Wellness (AWV)  04/25/2020   DTaP/Tdap/Td (2 - Td or Tdap) 09/09/2021   INFLUENZA VACCINE  03/17/2023   COVID-19 Vaccine (6 - 2023-24 season) 04/17/2023    Past Medical History:  Diagnosis Date   Anxiety state, unspecified 12/19/2013   Benign essential HTN 09/12/2011   Does not tolerate thiazide diuretics developed very bad dermatitis    Constipation 02/10/2012   Dermatitis 09/13/2012   Elevated PSA 11/03/2011   HTN (hypertension) 09/12/2011   Hyperlipidemia, mild 04/17/2015   Intertrigo 03/20/2012   Medicare annual wellness visit, subsequent 09/12/2011   Rash, skin 01/02/2012   Left lower leg   Restless sleeper 01/04/2017   Thrombocytopenia (HCC) 11/17/2011   slight   Uncontrolled hypertension 09/12/2011   Urinary hesitancy 10/12/2011   Urinary urgency 10/12/2011    No past surgical history on file.  Family History  Problem Relation Age of Onset   Migraines Daughter     Allergies Daughter    Allergies Son     Social History   Socioeconomic History   Marital status: Divorced    Spouse name: Not on file   Number of children: Not on file   Years of education: Not on file   Highest education level: Not on file  Occupational History   Not on file  Tobacco Use   Smoking status: Never   Smokeless tobacco: Never  Substance and Sexual Activity   Alcohol use: Yes    Comment: occasionally   Drug use: No   Sexual activity: Not Currently  Other Topics Concern   Not on file  Social History Narrative   Retired as an Naval architect   2 children   1 grandson   Social Determinants of Corporate investment banker Strain: Not on file  Food Insecurity: Not on file  Transportation Needs: Not on file  Physical Activity: Not on file  Stress: Not on file  Social Connections: Not on file  Intimate Partner Violence: Not on file    Outpatient Medications Prior to Visit  Medication Sig Dispense Refill   aspirin EC 81 MG tablet Take 1 tablet (81 mg total) by mouth daily.     FLUoxetine (PROZAC) 20 MG capsule TAKE 1 CAPSULE(20 MG) BY MOUTH DAILY 90 capsule 1   tamsulosin (FLOMAX) 0.4 MG CAPS capsule TAKE 1 CAPSULE(0.4 MG) BY MOUTH DAILY 90 capsule 1   triamcinolone cream (KENALOG) 0.1 % APPLY EXTERNALLY  TO THE AFFECTED AREA TWICE DAILY 80 g 1   amLODipine (NORVASC) 10 MG tablet Take 1 tablet (10 mg total) by mouth daily. 90 tablet 1   nebivolol (BYSTOLIC) 10 MG tablet TAKE 1 UVOZDG(64 MG) BY MOUTH DAILY 90 tablet 1   No facility-administered medications prior to visit.    Allergies  Allergen Reactions   Hctz [Hydrochlorothiazide] Rash   Bactrim [Sulfamethoxazole-Trimethoprim] Rash   Losartan Palpitations    Palpitations and fever    ROS    See HPI Objective:    Physical Exam Constitutional:      General: He is not in acute distress.    Appearance: He is well-developed.  HENT:     Head: Normocephalic and atraumatic.  Cardiovascular:     Rate and  Rhythm: Normal rate and regular rhythm.     Heart sounds: No murmur heard. Pulmonary:     Effort: Pulmonary effort is normal. No respiratory distress.     Breath sounds: Normal breath sounds. No wheezing or rales.  Skin:    General: Skin is warm and dry.  Neurological:     Mental Status: He is alert and oriented to person, place, and time.  Psychiatric:        Behavior: Behavior normal.        Thought Content: Thought content normal.      BP (!) 130/55   Pulse 98   Temp 97.6 F (36.4 C) (Oral)   Resp 16   Ht 5\' 2"  (1.575 m)   Wt 142 lb (64.4 kg)   SpO2 (!) 64%   BMI 25.97 kg/m  Wt Readings from Last 3 Encounters:  06/28/23 142 lb (64.4 kg)  02/01/23 139 lb 6.4 oz (63.2 kg)  10/04/22 138 lb 9.6 oz (62.9 kg)       Assessment & Plan:   Problem List Items Addressed This Visit       Unprioritized   Renal insufficiency    Lab Results  Component Value Date   CREATININE 1.32 06/27/2023   Stable creatinine.       Insomnia    Stable except for nocturia.        Hyperglycemia    Lab Results  Component Value Date   HGBA1C 6.4 06/27/2023         Elevated PSA - Primary    Continues to follow with flomax.       Benign essential HTN    BP acceptable for his age. Continue bystolic and amlodipine.       Relevant Medications   amLODipine (NORVASC) 10 MG tablet   nebivolol (BYSTOLIC) 10 MG tablet   Anxiety and depression    Fair mood.  Continues fluoxetine.         General Health Maintenance Discussed importance of flu vaccination and COVID-19 booster. Patient hesitant about flu vaccine but considering COVID-19 booster based on family input. -Encourage flu vaccination and COVID-19 booster. Patient to discuss with family and make decision.  I am having Mr. Jupiter C. Negron maintain his aspirin EC, triamcinolone cream, FLUoxetine, tamsulosin, amLODipine, and nebivolol.  Meds ordered this encounter  Medications   DISCONTD: nebivolol (BYSTOLIC) 10 MG  tablet    Sig: TAKE 1 TABLET(10 MG) BY MOUTH DAILY    Dispense:  90 tablet    Refill:  1    Order Specific Question:   Supervising Provider    Answer:   Danise Edge A [[id number]]   DISCONTD: amLODipine (NORVASC) 10 MG tablet    Sig:  Take 1 tablet (10 mg total) by mouth daily.    Dispense:  90 tablet    Refill:  1    Order Specific Question:   Supervising Provider    Answer:   Danise Edge A [[id number]]   amLODipine (NORVASC) 10 MG tablet    Sig: Take 1 tablet (10 mg total) by mouth daily.    Dispense:  90 tablet    Refill:  1    Order Specific Question:   Supervising Provider    Answer:   Danise Edge A [[id number]]   nebivolol (BYSTOLIC) 10 MG tablet    Sig: TAKE 1 TABLET(10 MG) BY MOUTH DAILY    Dispense:  90 tablet    Refill:  1    Order Specific Question:   Supervising Provider    Answer:   Danise Edge A [[id number]]

## 2023-09-27 ENCOUNTER — Other Ambulatory Visit: Payer: Self-pay | Admitting: Family Medicine

## 2023-12-02 ENCOUNTER — Other Ambulatory Visit: Payer: Self-pay | Admitting: Family Medicine

## 2024-01-10 NOTE — Assessment & Plan Note (Deleted)
 Hydrate and monitor

## 2024-01-10 NOTE — Assessment & Plan Note (Deleted)
 Encourage heart healthy diet such as MIND or DASH diet, increase exercise, avoid trans fats, simple carbohydrates and processed foods, consider a krill or fish or flaxseed oil cap daily.

## 2024-01-10 NOTE — Assessment & Plan Note (Deleted)
 Well controlled, no changes to meds. Encouraged heart healthy diet such as the DASH diet and exercise as tolerated.

## 2024-01-10 NOTE — Assessment & Plan Note (Deleted)
 hgba1c acceptable, minimize simple carbs. Increase exercise as tolerated.

## 2024-01-12 ENCOUNTER — Ambulatory Visit: Payer: Medicare Other | Admitting: Family Medicine

## 2024-01-12 DIAGNOSIS — N289 Disorder of kidney and ureter, unspecified: Secondary | ICD-10-CM

## 2024-01-12 DIAGNOSIS — E785 Hyperlipidemia, unspecified: Secondary | ICD-10-CM

## 2024-01-12 DIAGNOSIS — I10 Essential (primary) hypertension: Secondary | ICD-10-CM

## 2024-01-12 DIAGNOSIS — R739 Hyperglycemia, unspecified: Secondary | ICD-10-CM

## 2024-06-18 ENCOUNTER — Telehealth: Payer: Self-pay

## 2024-06-18 NOTE — Telephone Encounter (Signed)
 Patient is overdue for an appointment. LMOM for patient to call and schedule.

## 2024-08-14 ENCOUNTER — Other Ambulatory Visit: Payer: Self-pay

## 2024-08-14 MED ORDER — NEBIVOLOL HCL 10 MG PO TABS: 10.0000 mg | ORAL_TABLET | Freq: Every day | ORAL | 0 refills | Status: AC

## 2024-10-22 ENCOUNTER — Ambulatory Visit: Admitting: Family Medicine
